# Patient Record
Sex: Female | Born: 1935 | Race: White | Hispanic: No | Smoking: Former smoker
Health system: Southern US, Community
[De-identification: ages and names within clinical notes are randomized; demographics above are authoritative.]

## PROBLEM LIST (undated history)

## (undated) DIAGNOSIS — M1990 Unspecified osteoarthritis, unspecified site: Secondary | ICD-10-CM

## (undated) DIAGNOSIS — K219 Gastro-esophageal reflux disease without esophagitis: Secondary | ICD-10-CM

## (undated) DIAGNOSIS — D352 Benign neoplasm of pituitary gland: Secondary | ICD-10-CM

## (undated) DIAGNOSIS — I639 Cerebral infarction, unspecified: Secondary | ICD-10-CM

## (undated) DIAGNOSIS — I10 Essential (primary) hypertension: Secondary | ICD-10-CM

## (undated) DIAGNOSIS — F0150 Vascular dementia without behavioral disturbance: Secondary | ICD-10-CM

## (undated) HISTORY — PX: RECTOCELE REPAIR: SHX761

## (undated) HISTORY — PX: ABDOMINAL HYSTERECTOMY: SHX81

## (undated) HISTORY — PX: TONSILLECTOMY: SUR1361

---

## 2001-09-10 HISTORY — PX: OTHER SURGICAL HISTORY: SHX169

## 2002-06-23 ENCOUNTER — Encounter: Payer: Self-pay | Admitting: Orthopedic Surgery

## 2002-07-01 ENCOUNTER — Encounter: Payer: Self-pay | Admitting: Orthopedic Surgery

## 2002-07-01 ENCOUNTER — Inpatient Hospital Stay (HOSPITAL_COMMUNITY): Admission: RE | Admit: 2002-07-01 | Discharge: 2002-07-07 | Payer: Self-pay | Admitting: Orthopedic Surgery

## 2004-02-11 ENCOUNTER — Ambulatory Visit (HOSPITAL_COMMUNITY): Admission: RE | Admit: 2004-02-11 | Discharge: 2004-02-11 | Payer: Self-pay | Admitting: Neurosurgery

## 2006-09-10 HISTORY — PX: CATARACT EXTRACTION W/ INTRAOCULAR LENS  IMPLANT, BILATERAL: SHX1307

## 2011-08-30 ENCOUNTER — Encounter (HOSPITAL_COMMUNITY): Payer: Self-pay | Admitting: Pharmacy Technician

## 2011-09-05 NOTE — Progress Notes (Signed)
H&P performed 09/05/11 Dictation # [id number]

## 2011-09-05 NOTE — H&P (Signed)
Christie Nelson, Christie Nelson NO.:  [id number]  MEDICAL RECORD NO.:  [id number]  LOCATION:  PERIO                        FACILITY:  [facility]  PHYSICIAN:  Madlyn Frankel. Charlann Boxer, M.D.  DATE OF BIRTH:  11-Mar-1930  DATE OF ADMISSION:  09/18/2011 DATE OF DISCHARGE:                             HISTORY & PHYSICAL   ADMITTING DIAGNOSIS:  Osteoarthritis, left hip.  HISTORY OF PRESENT ILLNESS:  This is a 77 year old lady with a history of osteoarthritis of the left hip, which has failed conservative management to alleviate her symptoms.  She has had previous right total hip arthroplasty with no problems and a good result, is now scheduled for left total hip arthroplasty by anterior approach.  The surgery risk, benefits, and aftercare were discussed in detail with the patient. Questions invited and answered.  She is planning on going home after surgery.  She is given her home medicines of aspirin, iron, MiraLax, Colace, and Robaxin.  She is not a candidate for trans-cinnamic acid or dexamethasone preop, and will not receive that.  MEDICAL DOCTOR:  Fara Chute, MD.  DRUG ALLERGIES:  None.  CURRENT MEDICATIONS: 1. Bromocriptine 2.5 mg half tablet daily. 2. Diclofenac 50 mg b.i.d. 3. Pantoprazole 40 mg daily.  SERIOUS MEDICAL ILLNESSES:  Include: 1. 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 2. Arthritis. 3. Reflux.  PREVIOUS SURGERIES:  Include: 1. Tonsillectomy. 2. Hysterectomy. 3. Rectocele repair as well as right total hip arthroplasty.  FAMILY HISTORY:  Includes kidney disease and brain tumor in her mother.  SOCIAL HISTORY:  The patient is married.  She lives at home.  She does not smoke and does not drink, and again she is planning on going home after surgery.  REVIEW OF SYSTEMS:  CENTRAL NERVOUS SYSTEM:  Positive for anxiety, shingles, in cataracts.  PULMONARY:  Negative for shortness of breath, PND, and orthopnea.  CARDIOVASCULAR:  Negative for chest  pain or palpitation.  Positive for varicose veins.  Negative for DVT.  GI: Positive for reflux and hemorrhoids.  Negative for ulcers or hepatitis. GU:  Negative for urinary tract difficulty.  MUSCULOSKELETAL:  Positive in HPI.  PHYSICAL EXAMINATION:  GENERAL:  Reveals a well-developed, well- nourished lady, in no acute distress. VITAL SIGNS:  Blood pressure 155/82, pulse 63 and regular, respirations 12. HEENT.  Head normocephalic.  Nose patent.  Ears patent.  Pupils equal, round, reactive to light.  Throat without injection. NECK:  Supple without adenopathy.  Carotids 2+ without bruit. CHEST:  Clear to auscultation.  No rales or rhonchi.  Respirations 12. HEART:  Regular rate and rhythm at 63 beats per minute without murmur. ABDOMEN:  Soft with active bowel sounds.  No masses or organomegaly. NEUROLOGIC:  Patient is alert, oriented to time, place, person.  Cranial nerves II through XII grossly intact. EXTREMITIES:  The right hip is status post total hip arthroplasty with good result.  Good range of motion.  Neurovascular status intact.  The left hip shows decreased range of motion with pain.  Neurovascular status intact.  IMPRESSION:  Osteoarthritis, left hip.  PLAN:  Total hip arthroplasty, left hip, by anterior approach.     Jaquelyn Bitter. Ernestene Kiel.   ______________________________ Molli Hazard  Rosalia Hammers, M.D.    SJC/MEDQ  D:  09/05/2011  T:  09/05/2011  Job:  [id number]

## 2011-09-10 ENCOUNTER — Encounter (HOSPITAL_COMMUNITY)
Admission: RE | Admit: 2011-09-10 | Discharge: 2011-09-10 | Disposition: A | Discharge status: Home / Self Care | Payer: Medicare Other | Source: Ambulatory Visit | Attending: Orthopedic Surgery | Admitting: Orthopedic Surgery

## 2011-09-10 ENCOUNTER — Encounter (HOSPITAL_COMMUNITY): Payer: Self-pay

## 2011-09-10 HISTORY — DX: Gastro-esophageal reflux disease without esophagitis: K21.9

## 2011-09-10 HISTORY — DX: Unspecified osteoarthritis, unspecified site: M19.90

## 2011-09-10 HISTORY — DX: Benign neoplasm of pituitary gland: D35.2

## 2011-09-10 LAB — COMPREHENSIVE METABOLIC PANEL
ALT: 17 U/L (ref 0–35)
AST: 18 U/L (ref 0–37)
Albumin: 3.9 g/dL (ref 3.5–5.2)
Alkaline Phosphatase: 124 U/L — ABNORMAL HIGH (ref 39–117)
Chloride: 103 mEq/L (ref 96–112)
Potassium: 4 mEq/L (ref 3.5–5.1)
Sodium: 139 mEq/L (ref 135–145)
Total Protein: 7.2 g/dL (ref 6.0–8.3)

## 2011-09-10 LAB — CBC
HCT: 37.4 % (ref 36.0–46.0)
Hemoglobin: 11.9 g/dL — ABNORMAL LOW (ref 12.0–15.0)
MCH: 28.6 pg (ref 26.0–34.0)
MCV: 89.9 fL (ref 78.0–100.0)
RBC: 4.16 MIL/uL (ref 3.87–5.11)

## 2011-09-10 LAB — DIFFERENTIAL
Eosinophils Absolute: 0.2 10*3/uL (ref 0.0–0.7)
Eosinophils Relative: 3 % (ref 0–5)
Lymphs Abs: 1 10*3/uL (ref 0.7–4.0)
Monocytes Absolute: 0.4 10*3/uL (ref 0.1–1.0)
Monocytes Relative: 7 % (ref 3–12)
Neutrophils Relative %: 72 % (ref 43–77)

## 2011-09-10 LAB — URINE MICROSCOPIC-ADD ON

## 2011-09-10 LAB — SURGICAL PCR SCREEN
MRSA, PCR: NEGATIVE
Staphylococcus aureus: POSITIVE — AB

## 2011-09-10 LAB — URINALYSIS, ROUTINE W REFLEX MICROSCOPIC
Glucose, UA: NEGATIVE mg/dL
Hgb urine dipstick: NEGATIVE
Protein, ur: NEGATIVE mg/dL

## 2011-09-10 NOTE — Patient Instructions (Signed)
20 Anberlyn C Rumbold  09/10/2011   Your procedure is scheduled on: 09-17-2010  Report to [facility] at 1000 AM.  Call this number if you have problems the morning of surgery: [phone]   Remember:   Do not eat food:After Midnight.  May have clear liquids:until Midnight .  Clear liquids include soda, tea, black coffee, apple or grape juice, broth.  Take these medicines the morning of surgery with A SIP OF WATER: protonix, bromocriptin   Do not wear jewelry, make-up or nail polish.  Do not wear lotions, powders, or perfumes. .  Do not shave 48 hours prior to surgery.  Do not bring valuables to the hospital.  Contacts, dentures or bridgework may not be worn into surgery.  Leave suitcase in the car. After surgery it may be brought to your room.  For patients admitted to the hospital, checkout time is 11:00 AM the day of discharge.   Special Instructions: CHG Shower Use Special Wash: 1/2 bottle night before surgery and 1/2 bottle morning of surgery.neck down avoid private area, no shving 2 days before showers   Please read over the following fact sheets that you were given: MRSA Information, blood fact sheet  Cain Sieve, rn [facility] pre op nurse phone number call if needed [phone]

## 2011-09-10 NOTE — Pre-Procedure Instructions (Signed)
Medical clearance note dr Neita Carp on chart

## 2011-09-18 ENCOUNTER — Inpatient Hospital Stay (HOSPITAL_COMMUNITY)
Admission: RE | Admit: 2011-09-18 | Discharge: 2011-09-21 | DRG: 470 | Disposition: A | Discharge status: Home Health | Payer: Medicare Other | Source: Ambulatory Visit | Attending: Orthopedic Surgery | Admitting: Orthopedic Surgery

## 2011-09-18 ENCOUNTER — Inpatient Hospital Stay (HOSPITAL_COMMUNITY): Payer: Medicare Other

## 2011-09-18 ENCOUNTER — Encounter (HOSPITAL_COMMUNITY): Payer: Self-pay | Admitting: Anesthesiology

## 2011-09-18 ENCOUNTER — Inpatient Hospital Stay (HOSPITAL_COMMUNITY): Payer: Medicare Other | Admitting: Anesthesiology

## 2011-09-18 ENCOUNTER — Encounter (HOSPITAL_COMMUNITY): Payer: Self-pay | Admitting: *Deleted

## 2011-09-18 ENCOUNTER — Encounter (HOSPITAL_COMMUNITY)
Admission: RE | Disposition: A | Discharge status: Home Health | Payer: Self-pay | Source: Ambulatory Visit | Attending: Orthopedic Surgery

## 2011-09-18 DIAGNOSIS — Z96649 Presence of unspecified artificial hip joint: Secondary | ICD-10-CM

## 2011-09-18 DIAGNOSIS — M1610 Unilateral primary osteoarthritis, unspecified hip: Principal | ICD-10-CM | POA: Diagnosis present

## 2011-09-18 DIAGNOSIS — M169 Osteoarthritis of hip, unspecified: Principal | ICD-10-CM | POA: Diagnosis present

## 2011-09-18 HISTORY — PX: TOTAL HIP ARTHROPLASTY: SHX124

## 2011-09-18 LAB — ABO/RH: ABO/RH(D): O POS

## 2011-09-18 SURGERY — ARTHROPLASTY, HIP, TOTAL, ANTERIOR APPROACH
Anesthesia: General | Site: Hip | Laterality: Left | Wound class: Clean

## 2011-09-18 MED ORDER — CEFAZOLIN SODIUM 1-5 GM-% IV SOLN
1.0000 g | Freq: Four times a day (QID) | INTRAVENOUS | Status: AC
  Administered 2011-09-19 (×2): 1 g via INTRAVENOUS
  Filled 2011-09-18 (×3): qty 50

## 2011-09-18 MED ORDER — NEOSTIGMINE METHYLSULFATE 1 MG/ML IJ SOLN
INTRAMUSCULAR | Status: DC | PRN
  Administered 2011-09-18: 4 mg via INTRAVENOUS

## 2011-09-18 MED ORDER — LACTATED RINGERS IV SOLN
INTRAVENOUS | Status: DC
  Administered 2011-09-18: 14:00:00 via INTRAVENOUS
  Administered 2011-09-18: 1000 mL via INTRAVENOUS
  Administered 2011-09-18: 13:00:00 via INTRAVENOUS

## 2011-09-18 MED ORDER — 0.9 % SODIUM CHLORIDE (POUR BTL) OPTIME
TOPICAL | Status: DC | PRN
  Administered 2011-09-18: 1000 mL

## 2011-09-18 MED ORDER — HYDROCODONE-ACETAMINOPHEN 7.5-325 MG PO TABS
1.0000 | ORAL_TABLET | ORAL | Status: DC
  Administered 2011-09-18: 2 via ORAL
  Administered 2011-09-19 (×2): 1 via ORAL
  Administered 2011-09-19 (×2): 2 via ORAL
  Administered 2011-09-19 (×2): 1 via ORAL
  Administered 2011-09-20: 2 via ORAL
  Administered 2011-09-20 (×2): 1 via ORAL
  Administered 2011-09-20 (×2): 2 via ORAL
  Administered 2011-09-20: 1 via ORAL
  Administered 2011-09-21 (×2): 2 via ORAL
  Administered 2011-09-21: 1 via ORAL
  Filled 2011-09-18: qty 2
  Filled 2011-09-18: qty 1
  Filled 2011-09-18: qty 2
  Filled 2011-09-18 (×3): qty 1
  Filled 2011-09-18: qty 2
  Filled 2011-09-18: qty 1
  Filled 2011-09-18 (×3): qty 2
  Filled 2011-09-18: qty 1
  Filled 2011-09-18: qty 2
  Filled 2011-09-18 (×3): qty 1
  Filled 2011-09-18: qty 2

## 2011-09-18 MED ORDER — RIVAROXABAN 10 MG PO TABS
10.0000 mg | ORAL_TABLET | ORAL | Status: DC
  Administered 2011-09-19 – 2011-09-20 (×2): 10 mg via ORAL
  Filled 2011-09-18 (×3): qty 1

## 2011-09-18 MED ORDER — ONDANSETRON HCL 4 MG/2ML IJ SOLN: 4.0000 mg | Freq: Four times a day (QID) | INTRAMUSCULAR | Status: DC | PRN

## 2011-09-18 MED ORDER — METHOCARBAMOL 100 MG/ML IJ SOLN
500.0000 mg | Freq: Four times a day (QID) | INTRAVENOUS | Status: DC | PRN
  Administered 2011-09-18: 500 mg via INTRAVENOUS
  Filled 2011-09-18: qty 5

## 2011-09-18 MED ORDER — POTASSIUM CHLORIDE 2 MEQ/ML IV SOLN
100.0000 mL/h | INTRAVENOUS | Status: DC
  Administered 2011-09-19 (×2): 100 mL/h via INTRAVENOUS
  Filled 2011-09-18 (×11): qty 1000

## 2011-09-18 MED ORDER — ONDANSETRON HCL 4 MG PO TABS: 4.0000 mg | ORAL_TABLET | Freq: Four times a day (QID) | ORAL | Status: DC | PRN

## 2011-09-18 MED ORDER — ACETAMINOPHEN 10 MG/ML IV SOLN
INTRAVENOUS | Status: AC
  Filled 2011-09-18: qty 100

## 2011-09-18 MED ORDER — METOCLOPRAMIDE HCL 10 MG PO TABS: 5.0000 mg | ORAL_TABLET | Freq: Three times a day (TID) | ORAL | Status: DC | PRN

## 2011-09-18 MED ORDER — ALUM & MAG HYDROXIDE-SIMETH 200-200-20 MG/5ML PO SUSP: 30.0000 mL | ORAL | Status: DC | PRN

## 2011-09-18 MED ORDER — PANTOPRAZOLE SODIUM 40 MG PO TBEC
40.0000 mg | DELAYED_RELEASE_TABLET | Freq: Every day | ORAL | Status: DC
  Administered 2011-09-19 – 2011-09-20 (×2): 40 mg via ORAL
  Filled 2011-09-18 (×3): qty 1

## 2011-09-18 MED ORDER — POLYETHYLENE GLYCOL 3350 17 G PO PACK
17.0000 g | PACK | Freq: Two times a day (BID) | ORAL | Status: DC
  Administered 2011-09-18 – 2011-09-20 (×4): 17 g via ORAL
  Filled 2011-09-18 (×7): qty 1

## 2011-09-18 MED ORDER — PHENOL 1.4 % MT LIQD: 1.0000 | OROMUCOSAL | Status: DC | PRN

## 2011-09-18 MED ORDER — ZOLPIDEM TARTRATE 5 MG PO TABS: 5.0000 mg | ORAL_TABLET | Freq: Every evening | ORAL | Status: DC | PRN

## 2011-09-18 MED ORDER — METHOCARBAMOL 500 MG PO TABS
500.0000 mg | ORAL_TABLET | Freq: Four times a day (QID) | ORAL | Status: DC | PRN
  Administered 2011-09-19: 500 mg via ORAL
  Filled 2011-09-18: qty 1

## 2011-09-18 MED ORDER — ACETAMINOPHEN 325 MG PO TABS: 650.0000 mg | ORAL_TABLET | Freq: Four times a day (QID) | ORAL | Status: DC | PRN

## 2011-09-18 MED ORDER — MENTHOL 3 MG MT LOZG: 1.0000 | LOZENGE | OROMUCOSAL | Status: DC | PRN

## 2011-09-18 MED ORDER — DIPHENHYDRAMINE HCL 25 MG PO CAPS: 25.0000 mg | ORAL_CAPSULE | Freq: Four times a day (QID) | ORAL | Status: DC | PRN

## 2011-09-18 MED ORDER — HYDROMORPHONE HCL PF 1 MG/ML IJ SOLN: 0.5000 mg | INTRAMUSCULAR | Status: DC | PRN

## 2011-09-18 MED ORDER — PROPOFOL 10 MG/ML IV BOLUS
INTRAVENOUS | Status: DC | PRN
  Administered 2011-09-18: 100 mg via INTRAVENOUS

## 2011-09-18 MED ORDER — FERROUS SULFATE 325 (65 FE) MG PO TABS
325.0000 mg | ORAL_TABLET | Freq: Three times a day (TID) | ORAL | Status: DC
  Administered 2011-09-19 – 2011-09-21 (×7): 325 mg via ORAL
  Filled 2011-09-18 (×10): qty 1

## 2011-09-18 MED ORDER — EPHEDRINE SULFATE 50 MG/ML IJ SOLN
INTRAMUSCULAR | Status: DC | PRN
  Administered 2011-09-18: 10 mg via INTRAVENOUS

## 2011-09-18 MED ORDER — CEFAZOLIN SODIUM 1-5 GM-% IV SOLN
1.0000 g | INTRAVENOUS | Status: AC
  Administered 2011-09-18: 1 g via INTRAVENOUS

## 2011-09-18 MED ORDER — METOCLOPRAMIDE HCL 5 MG/ML IJ SOLN: 5.0000 mg | Freq: Three times a day (TID) | INTRAMUSCULAR | Status: DC | PRN

## 2011-09-18 MED ORDER — ACETAMINOPHEN 650 MG RE SUPP: 650.0000 mg | Freq: Four times a day (QID) | RECTAL | Status: DC | PRN

## 2011-09-18 MED ORDER — PROMETHAZINE HCL 25 MG/ML IJ SOLN: 6.2500 mg | INTRAMUSCULAR | Status: DC | PRN

## 2011-09-18 MED ORDER — GLYCOPYRROLATE 0.2 MG/ML IJ SOLN
INTRAMUSCULAR | Status: DC | PRN
  Administered 2011-09-18: .5 mg via INTRAVENOUS

## 2011-09-18 MED ORDER — ACETAMINOPHEN 10 MG/ML IV SOLN
INTRAVENOUS | Status: DC | PRN
  Administered 2011-09-18: 1000 mg via INTRAVENOUS

## 2011-09-18 MED ORDER — BISACODYL 5 MG PO TBEC: 5.0000 mg | DELAYED_RELEASE_TABLET | Freq: Every day | ORAL | Status: DC | PRN

## 2011-09-18 MED ORDER — DEXAMETHASONE SODIUM PHOSPHATE 10 MG/ML IJ SOLN
10.0000 mg | Freq: Once | INTRAMUSCULAR | Status: AC
  Administered 2011-09-19: 10 mg via INTRAVENOUS
  Filled 2011-09-18 (×2): qty 1

## 2011-09-18 MED ORDER — HYDROMORPHONE HCL PF 1 MG/ML IJ SOLN
0.2500 mg | INTRAMUSCULAR | Status: DC | PRN
  Administered 2011-09-18: 0.5 mg via INTRAVENOUS

## 2011-09-18 MED ORDER — ONDANSETRON HCL 4 MG/2ML IJ SOLN
INTRAMUSCULAR | Status: DC | PRN
  Administered 2011-09-18: 4 mg via INTRAVENOUS

## 2011-09-18 MED ORDER — ROCURONIUM BROMIDE 100 MG/10ML IV SOLN
INTRAVENOUS | Status: DC | PRN
  Administered 2011-09-18: 40 mg via INTRAVENOUS

## 2011-09-18 MED ORDER — FLEET ENEMA 7-19 GM/118ML RE ENEM: 1.0000 | ENEMA | Freq: Once | RECTAL | Status: AC | PRN

## 2011-09-18 MED ORDER — FENTANYL CITRATE 0.05 MG/ML IJ SOLN
INTRAMUSCULAR | Status: DC | PRN
  Administered 2011-09-18 (×3): 50 ug via INTRAVENOUS
  Administered 2011-09-18 (×2): 100 ug via INTRAVENOUS

## 2011-09-18 MED ORDER — DOCUSATE SODIUM 100 MG PO CAPS
100.0000 mg | ORAL_CAPSULE | Freq: Two times a day (BID) | ORAL | Status: DC
  Administered 2011-09-18 – 2011-09-20 (×5): 100 mg via ORAL
  Filled 2011-09-18 (×7): qty 1

## 2011-09-18 MED ORDER — BROMOCRIPTINE MESYLATE 2.5 MG PO TABS
1.2500 mg | ORAL_TABLET | Freq: Every day | ORAL | Status: DC
  Administered 2011-09-19 – 2011-09-21 (×3): 1.25 mg via ORAL
  Filled 2011-09-18 (×3): qty 1

## 2011-09-18 MED ORDER — HYDROMORPHONE HCL PF 1 MG/ML IJ SOLN
INTRAMUSCULAR | Status: AC
  Filled 2011-09-18: qty 1

## 2011-09-18 MED ORDER — PANTOPRAZOLE SODIUM 40 MG PO TBEC: 40.0000 mg | DELAYED_RELEASE_TABLET | ORAL | Status: DC

## 2011-09-18 SURGICAL SUPPLY — 40 items
ADH SKN CLS APL DERMABOND .7 (GAUZE/BANDAGES/DRESSINGS) ×1
BAG SPEC THK2 15X12 ZIP CLS (MISCELLANEOUS) ×2
BAG ZIPLOCK 12X15 (MISCELLANEOUS) ×4 IMPLANT
BLADE SAW SGTL 18X1.27X75 (BLADE) ×2 IMPLANT
CELLS DAT CNTRL 66122 CELL SVR (MISCELLANEOUS) ×1 IMPLANT
CLOTH BEACON ORANGE TIMEOUT ST (SAFETY) ×2 IMPLANT
DERMABOND ADVANCED (GAUZE/BANDAGES/DRESSINGS) ×1
DERMABOND ADVANCED .7 DNX12 (GAUZE/BANDAGES/DRESSINGS) ×1 IMPLANT
DRAPE C-ARM 42X72 X-RAY (DRAPES) ×2 IMPLANT
DRAPE STERI IOBAN 125X83 (DRAPES) ×2 IMPLANT
DRAPE U-SHAPE 47X51 STRL (DRAPES) ×6 IMPLANT
DRSG AQUACEL AG ADV 3.5X10 (GAUZE/BANDAGES/DRESSINGS) ×2 IMPLANT
DRSG TEGADERM 4X4.75 (GAUZE/BANDAGES/DRESSINGS) ×1 IMPLANT
DURAPREP 26ML APPLICATOR (WOUND CARE) ×2 IMPLANT
ELECT BLADE TIP CTD 4 INCH (ELECTRODE) ×2 IMPLANT
ELECT REM PT RETURN 9FT ADLT (ELECTROSURGICAL) ×2
ELECTRODE REM PT RTRN 9FT ADLT (ELECTROSURGICAL) ×1 IMPLANT
EVACUATOR 1/8 PVC DRAIN (DRAIN) IMPLANT
FACESHIELD LNG OPTICON STERILE (SAFETY) ×8 IMPLANT
GAUZE SPONGE 2X2 8PLY STRL LF (GAUZE/BANDAGES/DRESSINGS) ×1 IMPLANT
GLOVE BIOGEL PI IND STRL 7.5 (GLOVE) ×1 IMPLANT
GLOVE BIOGEL PI IND STRL 8 (GLOVE) ×1 IMPLANT
GLOVE BIOGEL PI INDICATOR 7.5 (GLOVE) ×1
GLOVE BIOGEL PI INDICATOR 8 (GLOVE) ×1
GLOVE ECLIPSE 8.0 STRL XLNG CF (GLOVE) ×2 IMPLANT
GLOVE ORTHO TXT STRL SZ7.5 (GLOVE) ×4 IMPLANT
GOWN STRL NON-REIN LRG LVL3 (GOWN DISPOSABLE) ×2 IMPLANT
KIT BASIN OR (CUSTOM PROCEDURE TRAY) ×2 IMPLANT
PACK TOTAL JOINT (CUSTOM PROCEDURE TRAY) ×2 IMPLANT
PADDING CAST COTTON 6X4 STRL (CAST SUPPLIES) ×2 IMPLANT
RETRACTOR WND ALEXIS 18 MED (MISCELLANEOUS) ×1 IMPLANT
RTRCTR WOUND ALEXIS 18CM MED (MISCELLANEOUS) ×2
SPONGE GAUZE 2X2 STER 10/PKG (GAUZE/BANDAGES/DRESSINGS) ×1
SUCTION FRAZIER 12FR DISP (SUCTIONS) ×2 IMPLANT
SUT MNCRL AB 4-0 PS2 18 (SUTURE) ×2 IMPLANT
SUT VIC AB 1 CT1 36 (SUTURE) ×8 IMPLANT
SUT VIC AB 2-0 CT1 27 (SUTURE) ×4
SUT VIC AB 2-0 CT1 TAPERPNT 27 (SUTURE) ×2 IMPLANT
TOWEL OR 17X26 10 PK STRL BLUE (TOWEL DISPOSABLE) ×4 IMPLANT
TRAY FOLEY CATH 14FRSI W/METER (CATHETERS) ×2 IMPLANT

## 2011-09-18 NOTE — Op Note (Signed)
NAME:  Christie Nelson                ACCOUNT NO.: [id number]      MEDICAL RECORD NO.: [id number]      FACILITY:  [facility]      PHYSICIAN:  Durene Romans D  DATE OF BIRTH:  05/26/1930     DATE OF PROCEDURE:  09/18/2011                                 OPERATIVE REPORT         PREOPERATIVE DIAGNOSIS: Left  hip osteoarthritis.      POSTOPERATIVE DIAGNOSIS:  Left osteoarthritis.      PROCEDURE:  Left total hip replacement through an anterior approach   utilizing DePuy THR system, component size 50 pinnacle cup, a size 32+4 neutral   Altrex liner, a size 4 standard Tri Lock stem with a 32+1 delta ceramic   ball.      SURGEON:  Madlyn Frankel. Charlann Boxer, M.D.      ASSISTANT:  Lanney Gins, PA      ANESTHESIA:  General.      SPECIMENS:  None.      COMPLICATIONS:  None.      BLOOD LOSS:  300 cc     DRAINS:  One Hemovac.      INDICATION OF THE PROCEDURE:  STARLINA Nelson is a 78 y.o. female who had   presented to office for evaluation of left hip pain.  Radiographs revealed   progressive degenerative changes with bone-on-bone   articulation to the  hip joint.  The patient had painful limited range of   motion significantly affecting their overall quality of life.  The patient was failing to    respond to conservative measures, and at this point was ready   to proceed with more definitive measures.  The patient has noted progressive   degenerative changes in his hip, progressive problems and dysfunction   with regarding the hip prior to surgery.  Consent was obtained for   benefit of pain relief.  Specific risk of infection, DVT, component   failure, dislocation, need for revision surgery, as well discussion of   the anterior versus posterior approach were reviewed.  Consent was   obtained for benefit of anterior pain relief through an anterior   approach.      PROCEDURE IN DETAIL:  The patient was brought to operative theater.   Once adequate anesthesia, preoperative antibiotics,  1gm Ancef administered.   The patient was positioned supine on the OSI Hanna table.  Once adequate   padding of boney process was carried out, we had predraped out the hip, and  used fluoroscopy to confirm orientation of the pelvis and position.      The left hip was then prepped and draped from proximal iliac crest to   mid thigh with shower curtain technique.      Time-out was performed identifying the patient, planned procedure, and   extremity.     An incision was then made 2 cm distal and lateral to the   anterior superior iliac spine extending over the orientation of the   tensor fascia lata muscle and sharp dissection was carried down to the   fascia of the muscle and protractor placed in the soft tissues.      The fascia was then incised.  The muscle belly was identified and swept  laterally and retractor placed along the superior neck.  Following   cauterization of the circumflex vessels and removing some pericapsular   fat, a second cobra retractor was placed on the inferior neck.  A third   retractor was placed on the anterior acetabulum after elevating the   anterior rectus.  A L-capsulotomy was along the line of the   superior neck to the trochanteric fossa, then extended proximally and   distally.  Tag sutures were placed and the retractors were then placed   intracapsular.  We then identified the trochanteric fossa and   orientation of my neck cut, confirmed this radiographically   and then made a neck osteotomy with the femur on traction.  The femoral   head was removed without difficulty or complication.  Traction was let   off and retractors were placed posterior and anterior around the   acetabulum.      The labrum and foveal tissue were debrided.  I began reaming with a 44   reamer and reamed up to 49 reamer with good bony bed preparation and a 50   cup was chosen.  The final 50 Pinnacle cup was then impacted under fluoroscopy  to confirm the depth of penetration  and orientation with respect to   abduction.  A screw was placed followed by the hole eliminator.  The final   32+4 Altrex liner was impacted with good visualized rim fit.  The cup was positioned anatomically within the acetabular portion of the pelvis.      At this point, the femur was rolled at 80 degrees.  Further capsule was   released off the inferior aspect of the femoral neck.  I then   released the superior capsule proximally.  The hook was placed laterally   along the femur and elevated manually and held in position with the bed   hook.  The leg was then extended and adducted with the leg rolled to 100   degrees of external rotation.  Once the proximal femur was fully   exposed, I used a box osteotome to set orientation.  I then began   broaching with the starting chili pepper broach and passed this by hand and then broached up to 4.  With the 4 broach in place I chose a standard neck and did a trial reduction.  The offset was appropriate, leg lengths   appeared to be equal, confirmed radiographically.   Given these findings, I went ahead and dislocated the hip, repositioned all   retractors and positioned the right hip in the extended and abducted position.  The final 4 Standard Tri Lock stem was   chosen and it was impacted down to the level of neck cut.  Based on this   and the trial reduction, a 32+1 delta ceramic ball was chosen and   impacted onto a clean and dry trunnion, and the hip was reduced.  The   hip had been irrigated throughout the case again at this point.  I did   reapproximate the superior capsular leaflet to the anterior leaflet   using #1 Vicryl, placed a medium Hemovac drain deep.  The fascia of the   tensor fascia lata muscle was then reapproximated using #1 Vicryl.  The   remaining wound was closed with 2-0 Vicryl and running 4-0 Monocryl.   The hip was cleaned, dried, and dressed sterilely using Dermabond and   Aquacel dressing.  Drain site dressed  separately.  She was then brought  to recovery room in stable condition tolerating the procedure well.    Lanney Gins was present for the entirety of the case involved from   preoperative positioning, perioperative retractor management, general   facilitation of the case, as well as primary wound closure as assistant.            Madlyn Frankel Charlann Boxer, M.D.            MDO/MEDQ  D:  07/03/2011  T:  07/03/2011  Job:  [id number]      Electronically Signed by Durene Romans M.D. on 07/09/2011 09:15:38 AM

## 2011-09-18 NOTE — Transfer of Care (Signed)
Immediate Anesthesia Transfer of Care Note  Patient: Christie Nelson  Procedure(s) Performed:  TOTAL HIP ARTHROPLASTY ANTERIOR APPROACH  Patient Location: PACU  Anesthesia Type: General  Level of Consciousness: awake and patient cooperative  Airway & Oxygen Therapy: Patient Spontanous Breathing and Patient connected to face mask oxygen  Post-op Assessment: Report given to PACU RN and Post -op Vital signs reviewed and stable  Post vital signs: Reviewed and stable  Complications: No apparent anesthesia complications

## 2011-09-18 NOTE — Interval H&P Note (Signed)
History and Physical Interval Note:  09/18/2011 12:04 PM  Christie Nelson  has presented today for surgery, with the diagnosis of left hip arthritis   The various methods of treatment have been discussed with the patient and family. After consideration of risks, benefits and other options for treatment, the patient has consented to  Procedure(s): LEFT TOTAL HIP ARTHROPLASTY ANTERIOR APPROACH as a surgical intervention .  The patients' history has been reviewed, patient examined, no change in status, stable for surgery.  I have reviewed the patients' chart and labs.  Questions were answered to the patient's satisfaction.     Shelda Pal

## 2011-09-18 NOTE — Anesthesia Postprocedure Evaluation (Signed)
  Anesthesia Post-op Note  Patient: Christie Nelson  Procedure(s) Performed:  TOTAL HIP ARTHROPLASTY ANTERIOR APPROACH  Patient Location: PACU  Anesthesia Type: General  Level of Consciousness: awake and alert   Airway and Oxygen Therapy: Patient Spontanous Breathing  Post-op Pain: mild  Post-op Assessment: Post-op Vital signs reviewed, Patient's Cardiovascular Status Stable, Respiratory Function Stable, Patent Airway and No signs of Nausea or vomiting  Post-op Vital Signs: stable  Complications: No apparent anesthesia complications

## 2011-09-18 NOTE — Anesthesia Preprocedure Evaluation (Signed)
Anesthesia Evaluation  Patient identified by MRN, date of birth, ID band Patient awake    Reviewed: Allergy & Precautions, H&P , NPO status , Patient's Chart, lab work & pertinent test results  Airway Mallampati: II TM Distance: >3 FB Neck ROM: Full    Dental No notable dental hx.    Pulmonary neg pulmonary ROS,  clear to auscultation  Pulmonary exam normal       Cardiovascular neg cardio ROS Regular Normal    Neuro/Psych Negative Neurological ROS  Negative Psych ROS   GI/Hepatic negative GI ROS, Neg liver ROS, GERD-  ,  Endo/Other  Negative Endocrine ROS  Renal/GU negative Renal ROS  Genitourinary negative   Musculoskeletal negative musculoskeletal ROS (+)   Abdominal   Peds negative pediatric ROS (+)  Hematology negative hematology ROS (+)   Anesthesia Other Findings   Reproductive/Obstetrics negative OB ROS                          Anesthesia Physical Anesthesia Plan  ASA: II  Anesthesia Plan: General   Post-op Pain Management:    Induction: Intravenous  Airway Management Planned: Oral ETT  Additional Equipment:   Intra-op Plan:   Post-operative Plan: Extubation in OR  Informed Consent: I have reviewed the patients History and Physical, chart, labs and discussed the procedure including the risks, benefits and alternatives for the proposed anesthesia with the patient or authorized representative who has indicated his/her understanding and acceptance.   Dental advisory given  Plan Discussed with: CRNA  Anesthesia Plan Comments:        Anesthesia Quick Evaluation  

## 2011-09-18 NOTE — H&P (View-Only) (Signed)
H&P performed 09/05/11 Dictation # [id number]  

## 2011-09-19 LAB — CBC
MCHC: 32.3 g/dL (ref 30.0–36.0)
MCV: 89.6 fL (ref 78.0–100.0)
Platelets: 186 10*3/uL (ref 150–400)
RDW: 13.3 % (ref 11.5–15.5)
WBC: 5.6 10*3/uL (ref 4.0–10.5)

## 2011-09-19 LAB — BASIC METABOLIC PANEL
Chloride: 102 mEq/L (ref 96–112)
Creatinine, Ser: 0.85 mg/dL (ref 0.50–1.10)
GFR calc Af Amer: 74 mL/min — ABNORMAL LOW (ref >90)
GFR calc non Af Amer: 63 mL/min — ABNORMAL LOW (ref >90)

## 2011-09-19 NOTE — Progress Notes (Signed)
Subjective: 1 Day Post-Op Procedure(s) (LRB): TOTAL HIP ARTHROPLASTY ANTERIOR APPROACH (Left)   Patient reports pain as mild.  Objective:   VITALS:   Filed Vitals:   09/19/11 0600  BP: 119/56  Pulse: 69  Temp: 98.7 F (37.1 C)  Resp: 16    Neurovascular intact Dorsiflexion/Plantar flexion intact Incision: dressing C/D/I No cellulitis present Compartment soft  LABS  Basename 09/19/11 0431  HGB 9.2*  HCT 28.5*  WBC 5.6  PLT 186     Basename 09/19/11 0431  NA 135  K 4.0  BUN 9  CREATININE 0.85  GLUCOSE 99     Assessment/Plan: 1 Day Post-Op Procedure(s) (LRB): TOTAL HIP ARTHROPLASTY ANTERIOR APPROACH (Left)   Foley cath D/C'ed HV drain D/C'ed Advance diet Up with therapy D/C IV fluids Evaluate how she is doing, unsure if husband is able to take care of her at home, consult SW.   Anastasio Auerbach Leonda Cristo   PAC  09/19/2011, 9:48 AM

## 2011-09-19 NOTE — Progress Notes (Signed)
Physical Therapy Evaluation Patient Details Name: Christie Nelson MRN: [id number] DOB: [birth year] Today's Date: 09/19/2011 9:40-10:07, Ev2  Problem List:  Patient Active Problem List  Diagnoses  . S/P left hip replacement    Past Medical History:  Past Medical History  Diagnosis Date  . GERD (gastroesophageal reflux disease)   . Arthritis   . Prolactinoma    Past Surgical History:  Past Surgical History  Procedure Date  . Right hip arthroplasty 2003  . Abdominal hysterectomy yrs ago  . Tonsillectomy age 61  . Rectocele repair yrs ago  . Cataract extraction w/ intraocular lens  implant, bilateral 2008    both eyes done    PT Assessment/Plan/Recommendation PT Assessment Clinical Impression Statement: 78 y/o WF admitted with THA- anterior approach.  Pt will be followed acutely to work towards returning to PLOF.  Recommend SNF for more rehab after d/c from acute care. PT Recommendation/Assessment: Patient will need skilled PT in the acute care venue PT Problem List: Decreased strength;Decreased range of motion;Decreased mobility;Decreased knowledge of use of DME;Pain Barriers to Discharge: Decreased caregiver support Barriers to Discharge Comments: Pt reports she normally does most things around the house and doesn't know how much help her husband would be able to give. PT Therapy Diagnosis : Difficulty walking PT Plan PT Frequency: 7X/week PT Treatment/Interventions: DME instruction;Gait training;Functional mobility training;Therapeutic activities;Therapeutic exercise;Patient/family education PT Recommendation Follow Up Recommendations: Skilled nursing facility Equipment Recommended: Defer to next venue PT Goals  Acute Rehab PT Goals PT Goal Formulation: With patient Time For Goal Achievement: 7 days Pt will go Supine/Side to Sit: with supervision PT Goal: Supine/Side to Sit - Progress: Not met Pt will go Sit to Stand: with supervision PT Goal: Sit to Stand - Progress:  Not met Pt will Ambulate: 51 - 150 feet;with rolling walker;with supervision PT Goal: Ambulate - Progress: Not met Pt will Perform Home Exercise Program: with supervision, verbal cues required/provided PT Goal: Perform Home Exercise Program - Progress: Not met  PT Evaluation Precautions/Restrictions  Precautions Precautions: Anterior Hip Precaution Booklet Issued: No Required Braces or Orthoses: No Restrictions Weight Bearing Restrictions: Yes LLE Weight Bearing: Weight bearing as tolerated Prior Functioning  Home Living Lives With: Spouse Type of Home: Mobile home Home Layout: One level Home Access: Stairs to enter Entrance Stairs-Rails: Can reach both;Left;Right Entrance Stairs-Number of Steps: 3 Bathroom Shower/Tub: Health visitor: Handicapped height Home Adaptive Equipment: Straight cane;Walker - rolling Additional Comments: Elevated commode seat Prior Function Level of Independence: Independent with basic ADLs;Independent with homemaking with ambulation;Requires assistive device for independence Driving: No Vocation: Retired Producer, television/film/video: Awake/alert Overall Cognitive Status: Appears within functional limits for tasks assessed Orientation Level: Oriented X4 Sensation/Coordination Coordination Gross Motor Movements are Fluid and Coordinated: Yes Extremity Assessment RUE Assessment RUE Assessment: Within Functional Limits LUE Assessment LUE Assessment: Within Functional Limits RLE Assessment RLE Assessment: Within Functional Limits LLE AROM (degrees) LLE Overall AROM Comments: AAROM hip flex and hip abd limited 50% by pain and some muscle guarding LLE Strength LLE Overall Strength Comments: Good quad set Mobility (including Balance) Bed Mobility Bed Mobility: Yes Supine to Sit: 4: Min assist;With rails;HOB elevated (Comment degrees) Supine to Sit Details (indicate cue type and reason): A for LLE and cues for hand  placement. Transfers Transfers: Yes Sit to Stand: 4: Min assist;With upper extremity assist;From bed Sit to Stand Details (indicate cue type and reason): good use of UE Stand to Sit: To chair/3-in-1;4: Min assist;With upper extremity assist Stand to Sit Details:  cues for hand placement, LLE position. Ambulation/Gait Ambulation/Gait: Yes Ambulation/Gait Assistance: 4: Min assist Ambulation/Gait Assistance Details (indicate cue type and reason): bed to recliner only due to feeling ill.  Pt with little weight through L LE. Ambulation Distance (Feet): 2 Feet Assistive device: Rolling walker Gait Pattern: Decreased step length - right  Posture/Postural Control Posture/Postural Control: No significant limitations Exercise  Total Joint Exercises Quad Sets: 5 reps;Strengthening;Left;Supine Heel Slides: AAROM;5 reps;Left;Supine Hip ABduction/ADduction: AAROM;Left;5 reps;Supine End of Session PT - End of Session Equipment Utilized During Treatment: Gait belt Activity Tolerance: Other (comment) (limited by nausea) Patient left: in chair;with call bell in reach Nurse Communication: Mobility status for transfers;Mobility status for ambulation (pt's indigestion/nausea) General Behavior During Session: Lethargic Cognition: WFL for tasks performed  [facility] LUBECK 09/19/2011, 10:21 AM

## 2011-09-19 NOTE — Progress Notes (Signed)
FL2 in shadow chart for MD signature. Pt agreeable with plan for ST SNF placement and has been faxed out to [place]. Pt is interested in [facility]. Awaiting return call from SNF to check bed availability. Will assist with D/C planning to SNF.

## 2011-09-19 NOTE — Progress Notes (Signed)
Occupational Therapy Evaluation Patient Details Name: Christie Nelson MRN: [id number] DOB: 07/24/1930 Today's Date: 09/19/2011 [phone] EV2 Problem List:  Patient Active Problem List  Diagnoses  . S/P left hip replacement    Past Medical History:  Past Medical History  Diagnosis Date  . GERD (gastroesophageal reflux disease)   . Arthritis   . Prolactinoma    Past Surgical History:  Past Surgical History  Procedure Date  . Right hip arthroplasty 2003  . Abdominal hysterectomy yrs ago  . Tonsillectomy age 78  . Rectocele repair yrs ago  . Cataract extraction w/ intraocular lens  implant, bilateral 2008    both eyes done    OT Assessment/Plan/Recommendation OT Assessment Clinical Impression Statement: Pt presents w/new LTKR. Skilled OT recommended to improve standing activity tolerance, increase BADL performance in prep for d/c to next venue of care. OT Recommendation/Assessment: Patient will need skilled OT in the acute care venue OT Problem List: Decreased activity tolerance;Decreased knowledge of use of DME or AE Barriers to Discharge: Inaccessible home environment;Decreased caregiver support OT Therapy Diagnosis : Generalized weakness OT Plan OT Frequency: Min 1X/week OT Treatment/Interventions: Self-care/ADL training;DME and/or AE instruction;Therapeutic activities;Patient/family education OT Recommendation Follow Up Recommendations: Skilled nursing facility Equipment Recommended: Defer to next venue Individuals Consulted Consulted and Agree with Results and Recommendations: Patient OT Goals Acute Rehab OT Goals OT Goal Formulation: With patient ADL Goals Pt Will Perform Grooming: with supervision;Standing at sink (X 3 tasks to improve standing activity tolerance.) ADL Goal: Grooming - Progress: Not met Pt Will Transfer to Toilet: with supervision;Ambulation;3-in-1;with DME ADL Goal: Toilet Transfer - Progress: Not met Pt Will Perform Toileting - Clothing  Manipulation: with supervision;Standing ADL Goal: Toileting - Clothing Manipulation - Progress: Not met Pt Will Perform Toileting - Hygiene: with supervision;Sit to stand from 3-in-1/toilet ADL Goal: Toileting - Hygiene - Progress: Not met  OT Evaluation Precautions/Restrictions  Precautions Precautions: Anterior Hip Precaution Booklet Issued: No Required Braces or Orthoses: No Restrictions Weight Bearing Restrictions: Yes LLE Weight Bearing: Weight bearing as tolerated Prior Functioning Home Living Lives With: Spouse Type of Home: Mobile home Home Layout: One level Home Access: Stairs to enter Entrance Stairs-Rails: Can reach both;Left;Right Entrance Stairs-Number of Steps: 3 Bathroom Shower/Tub: Health visitor: Handicapped height Home Adaptive Equipment: Straight cane;Walker - rolling Additional Comments: Elevated commode seat Prior Function Level of Independence: Independent with basic ADLs;Independent with homemaking with ambulation;Requires assistive device for independence Driving: No Vocation: Retired ADL ADL Eating/Feeding: Simulated;Independent Where Assessed - Eating/Feeding: Edge of bed Grooming: Performed;Wash/dry face;Set up Where Assessed - Grooming: Sitting, bed;Unsupported Upper Body Bathing: Simulated;Set up Where Assessed - Upper Body Bathing: Unsupported;Sitting, bed Lower Body Bathing: Simulated;Maximal assistance Where Assessed - Lower Body Bathing: Sit to stand from bed Upper Body Dressing: Performed;Set up Where Assessed - Upper Body Dressing: Sitting, bed;Unsupported Lower Body Dressing: Simulated;Maximal assistance Where Assessed - Lower Body Dressing: Sit to stand from bed Toilet Transfer: Simulated;Minimal assistance Toilet Transfer Details (indicate cue type and reason): multimodal cues for hand placement, step sequence, technique. Toilet Transfer Method: Surveyor, minerals: Other (comment) (sim to  chair) Toileting - Clothing Manipulation: Simulated;Moderate assistance Where Assessed - Toileting Clothing Manipulation: Standing Toileting - Hygiene: Simulated;Moderate assistance Where Assessed - Toileting Hygiene: Standing Tub/Shower Transfer: Not assessed Tub/Shower Transfer Method: Not assessed Equipment Used: Rolling walker ADL Comments: Pt w/poor activity/standing tolerance. Limited by dizziness, nausea and lethargy. Vision/Perception  Vision - History Visual History: Cataracts Patient Visual Report: No change from baseline Vision - Assessment  Vision Assessment: Vision not tested Cognition Cognition Arousal/Alertness: Awake/alert Overall Cognitive Status: Appears within functional limits for tasks assessed Orientation Level: Oriented X4 Sensation/Coordination Coordination Gross Motor Movements are Fluid and Coordinated: Yes Extremity Assessment RUE Assessment RUE Assessment: Within Functional Limits LUE Assessment LUE Assessment: Within Functional Limits Mobility  Bed Mobility Bed Mobility: Yes Supine to Sit: 4: Min assist;With rails;HOB elevated (Comment degrees) Supine to Sit Details (indicate cue type and reason): A for LLE and cues for hand placement. Transfers Transfers: Yes Sit to Stand: 4: Min assist;With upper extremity assist;From bed Sit to Stand Details (indicate cue type and reason): good use of UE Stand to Sit: To chair/3-in-1;4: Min assist;With upper extremity assist Stand to Sit Details: cues for hand placement, LLE position. Exercises  End of Session OT - End of Session Equipment Utilized During Treatment: Gait belt;Other (comment) (RW) Activity Tolerance: Patient limited by fatigue;Treatment limited secondary to medical complications (Comment) (dizziness and nausea.) Patient left: in chair;with call bell in reach Nurse Communication: Other (comment) (Informed RN of pt dizziness and nausea.) General Behavior During Session: Lethargic Cognition:  WFL for tasks performed   Reece Mcbroom A [phone] 09/19/2011, 10:23 AM

## 2011-09-19 NOTE — Progress Notes (Signed)
Physical Therapy Treatment Patient Details Name: Christie Nelson MRN: [id number] DOB: 1929/12/16 Today's Date: 09/19/2011 13:00-13:29, gt ,te PT Assessment/Plan  PT - Assessment/Plan Comments on Treatment Session: Pt up in recliner upon arrival with family members present.  Pt agreeable to ambulate short distance back to bed.  Pt demonstrates good upper body strength with bed mobility and re-positioning. PT Plan: Discharge plan remains appropriate;Frequency remains appropriate PT Frequency: 7X/week Follow Up Recommendations: Skilled nursing facility Equipment Recommended: Defer to next venue PT Goals  Acute Rehab PT Goals Pt will go Sit to Stand: with supervision PT Goal: Sit to Stand - Progress: Not met Pt will Ambulate: 51 - 150 feet;with rolling walker;with supervision PT Goal: Ambulate - Progress: Not met Pt will Perform Home Exercise Program: with supervision, verbal cues required/provided PT Goal: Perform Home Exercise Program - Progress: Not met  PT Treatment Precautions/Restrictions  Precautions Precautions: Anterior Hip Precaution Booklet Issued: No Required Braces or Orthoses: No Restrictions Weight Bearing Restrictions: Yes LLE Weight Bearing: Weight bearing as tolerated Mobility (including Balance) Bed Mobility Sit to Supine: 4: Min assist Sit to Supine - Details (indicate cue type and reason): A for L LE.  Pt with strong upper body and able to reposition self well. Transfers Sit to Stand: 4: Min assist;From chair/3-in-1;With upper extremity assist Stand to Sit: 4: Min assist;To bed;With upper extremity assist Stand to Sit Details: Cues for hand and LE placement Ambulation/Gait Ambulation/Gait Assistance: 4: Min assist Ambulation Distance (Feet): 8 Feet Assistive device: Rolling walker Gait Pattern: Step-to pattern;Decreased step length - right;Decreased step length - left;Right flexed knee in stance    Exercise  Total Joint Exercises Ankle Circles/Pumps:  AROM;10 reps;Supine Quad Sets: Strengthening;Left;10 reps;Supine Heel Slides: AAROM;10 reps;Left;Supine Hip ABduction/ADduction: AAROM;Left;10 reps;Supine End of Session PT - End of Session Equipment Utilized During Treatment: Gait belt Activity Tolerance: Patient tolerated treatment well (Pt required some encourgement to ambulate.) Patient left: in bed;with call bell in reach;with family/visitor present General Behavior During Session: [facility] for tasks performed Cognition: [facility] for tasks performed  [facility] LUBECK 09/19/2011, 2:21 PM

## 2011-09-19 NOTE — Progress Notes (Signed)
Utilization review completed.  

## 2011-09-20 LAB — CBC
HCT: 27.2 % — ABNORMAL LOW (ref 36.0–46.0)
Platelets: 186 10*3/uL (ref 150–400)
RDW: 13.2 % (ref 11.5–15.5)
WBC: 4.7 10*3/uL (ref 4.0–10.5)

## 2011-09-20 LAB — BASIC METABOLIC PANEL
BUN: 7 mg/dL (ref 6–23)
Chloride: 106 mEq/L (ref 96–112)
GFR calc Af Amer: 79 mL/min — ABNORMAL LOW (ref >90)
Potassium: 4.5 mEq/L (ref 3.5–5.1)
Sodium: 139 mEq/L (ref 135–145)

## 2011-09-20 NOTE — Progress Notes (Signed)
Subjective: 2 Days Post-Op Procedure(s) (LRB): TOTAL HIP ARTHROPLASTY ANTERIOR APPROACH (Left)   Patient reports pain as mild. No events.  Objective:   VITALS:   Filed Vitals:   09/20/11 0510  BP: 103/62  Pulse: 59  Temp: 97.4 F (36.3 C)  Resp: 18    Neurovascular intact Dorsiflexion/Plantar flexion intact Incision: dressing C/D/I No cellulitis present Compartment soft  LABS  Basename 09/20/11 0437 09/19/11 0431  HGB 8.8* 9.2*  HCT 27.2* 28.5*  WBC 4.7 5.6  PLT 186 186     Basename 09/20/11 0437 09/19/11 0431  NA 139 135  K 4.5 4.0  BUN 7 9  CREATININE 0.80 0.85  GLUCOSE 159* 99     Assessment/Plan: 2 Days Post-Op Procedure(s) (LRB): TOTAL HIP ARTHROPLASTY ANTERIOR APPROACH (Left)   Up with therapy Discharge to SNF upon discharge Pt approaching discharge, possibly Friday    Anastasio Auerbach. Regie Bunner   PAC  09/20/2011, 9:17 AM

## 2011-09-20 NOTE — Progress Notes (Signed)
Physical Therapy Treatment Patient Details Name: Christie Nelson MRN: [id number] DOB: 10-28-1929 Today's Date: 09/20/2011 13:40-14:15, gt, [place]  PT Assessment/Plan  PT - Assessment/Plan Comments on Treatment Session: Familiy present.  Pt reports she doesn't want to go to SNF anymore.  Discussed options with pt and daughter-in-law.  They will discuss which house is better to go to and let therapist know tomorrow for appropriate stair training.  Pt has 4 steps with 2 rails, and daughter-in-laws house has no rails with 3 steps. PT Plan: Frequency remains appropriate PT Frequency: 7X/week Follow Up Recommendations: Home health PT (Pt declined SNF) Equipment Recommended: 3 in 1 bedside comode PT Goals  Acute Rehab PT Goals PT Goal Formulation: With patient Pt will go Supine/Side to Sit: with supervision PT Goal: Supine/Side to Sit - Progress: Progressing toward goal Pt will go Sit to Stand: with supervision PT Goal: Sit to Stand - Progress: Met Pt will Ambulate: 51 - 150 feet;with rolling walker;with supervision PT Goal: Ambulate - Progress: Progressing toward goal Pt will Go Up / Down Stairs: 3-5 stairs;with min assist (with rails vs RW depending on which house pt discharges to.) PT Goal: Up/Down Stairs - Progress: Not met (New goal 09/20/11) Pt will Perform Home Exercise Program: with supervision, verbal cues required/provided PT Goal: Perform Home Exercise Program - Progress: Progressing toward goal  PT Treatment Precautions/Restrictions  Precautions Precautions: Anterior Hip Precaution Booklet Issued: No Required Braces or Orthoses: No Restrictions Weight Bearing Restrictions: Yes LLE Weight Bearing: Weight bearing as tolerated Mobility (including Balance) Bed Mobility Sit to Supine: 5: Supervision Sit to Supine - Details (indicate cue type and reason): Did well with getting LE onto bed. Transfers Sit to Stand: 5: Supervision;From chair/3-in-1;From toilet Sit to Stand Details  (indicate cue type and reason): MIN/GUARD Stand to Sit: 4: Min assist;To bed;To toilet Stand to Sit Details: min/guard Ambulation/Gait Ambulation/Gait Assistance: 4: Min assist Ambulation/Gait Assistance Details (indicate cue type and reason): min/guard with shoes on for elevated R shoe. cues to push RW rather than pick it up. Ambulation Distance (Feet): 100 Feet Assistive device: Rolling walker Gait Pattern: Step-through pattern     End of Session PT - End of Session Equipment Utilized During Treatment: Gait belt Activity Tolerance: Patient tolerated treatment well Patient left: in bed;with call bell in reach;with family/visitor present Nurse Communication: Mobility status for transfers;Mobility status for ambulation General Behavior During Session: [facility] for tasks performed Cognition: [facility] for tasks performed  [facility] LUBECK 09/20/2011, 2:38 PM

## 2011-09-20 NOTE — Progress Notes (Signed)
Physical Therapy Treatment Patient Details Name: Christie Nelson MRN: [id number] DOB: June 15, 1930 Today's Date: 09/20/2011 10:40-11:05  PT Assessment/Plan  PT - Assessment/Plan Comments on Treatment Session: Pt did well with PT.  Plan is for SNF probably tomorrow. PT Plan: Discharge plan remains appropriate;Frequency remains appropriate PT Frequency: 7X/week Follow Up Recommendations: Skilled nursing facility Equipment Recommended: Defer to next venue PT Goals  Acute Rehab PT Goals PT Goal Formulation: With patient Pt will go Supine/Side to Sit: with supervision PT Goal: Supine/Side to Sit - Progress: Progressing toward goal Pt will go Sit to Stand: with supervision PT Goal: Sit to Stand - Progress: Progressing toward goal Pt will Ambulate: 51 - 150 feet;with rolling walker;with supervision PT Goal: Ambulate - Progress: Progressing toward goal Pt will Perform Home Exercise Program: with supervision, verbal cues required/provided PT Goal: Perform Home Exercise Program - Progress: Progressing toward goal  PT Treatment Precautions/Restrictions  Precautions Precautions: Anterior Hip Precaution Booklet Issued: No Required Braces or Orthoses: No Restrictions Weight Bearing Restrictions: Yes LLE Weight Bearing: Weight bearing as tolerated Mobility (including Balance) Bed Mobility Supine to Sit: 4: Min assist;With rails;HOB elevated (Comment degrees) (A for L LE only at beginning of transfer) Transfers Sit to Stand: 4: Min assist;From bed;From chair/3-in-1 (MIn/guard) Sit to Stand Details (indicate cue type and reason): MIN/GUARD Stand to Sit: 4: Min assist;To chair/3-in-1 Stand to Sit Details: MIN/GUARD Ambulation/Gait Ambulation/Gait Assistance: 4: Min assist Ambulation/Gait Assistance Details (indicate cue type and reason): MIN/Guard. Pt keeps R heel up.  She states she usually wears a built up shoe on the R. Ambulation Distance (Feet): 50 Feet Assistive device: Rolling  walker Gait Pattern: Step-through pattern (R heel off ground due to leg length discrepancy)    Exercise  Total Joint Exercises Ankle Circles/Pumps: AROM;10 reps;Supine Quad Sets: Strengthening;Left;10 reps;Supine Heel Slides: 10 reps;Left;Supine;AROM Hip ABduction/ADduction: Left;10 reps;Supine;AROM End of Session PT - End of Session Equipment Utilized During Treatment: Gait belt Activity Tolerance: Patient tolerated treatment well Patient left: in chair;with call bell in reach Nurse Communication: Mobility status for transfers;Mobility status for ambulation General Behavior During Session: [facility] for tasks performed Cognition: [facility] for tasks performed  [facility] LUBECK 09/20/2011, 11:29 AM

## 2011-09-21 ENCOUNTER — Encounter (HOSPITAL_COMMUNITY): Payer: Self-pay | Admitting: Orthopedic Surgery

## 2011-09-21 MED ORDER — HYDROCODONE-ACETAMINOPHEN 7.5-325 MG PO TABS: 1.0000 | ORAL_TABLET | ORAL | Status: AC

## 2011-09-21 MED ORDER — METHOCARBAMOL 500 MG PO TABS: 500.0000 mg | ORAL_TABLET | Freq: Four times a day (QID) | ORAL | Status: AC | PRN

## 2011-09-21 MED ORDER — POLYETHYLENE GLYCOL 3350 17 G PO PACK: 17.0000 g | PACK | Freq: Two times a day (BID) | ORAL | Status: AC

## 2011-09-21 MED ORDER — FERROUS SULFATE 325 (65 FE) MG PO TABS: 325.0000 mg | ORAL_TABLET | Freq: Three times a day (TID) | ORAL | Status: DC

## 2011-09-21 MED ORDER — DSS 100 MG PO CAPS: 100.0000 mg | ORAL_CAPSULE | Freq: Two times a day (BID) | ORAL | Status: AC

## 2011-09-21 MED ORDER — DIPHENHYDRAMINE HCL 25 MG PO CAPS: 25.0000 mg | ORAL_CAPSULE | Freq: Four times a day (QID) | ORAL | Status: AC | PRN

## 2011-09-21 MED ORDER — ASPIRIN EC 325 MG PO TBEC: 325.0000 mg | DELAYED_RELEASE_TABLET | Freq: Two times a day (BID) | ORAL | Status: AC

## 2011-09-21 NOTE — Discharge Summary (Signed)
Physician Discharge Summary  Patient ID: Christie Nelson MRN: [id number] DOB/AGE: [birth year] 76 y.o.  Admit date: 09/18/2011 Discharge date: 09/21/2011  Procedures:  Procedure(s) (LRB): TOTAL HIP ARTHROPLASTY ANTERIOR APPROACH (Left)  Attending Physician: Shelda Pal   Admission Diagnoses: Osteoarthritis, left hip.  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  Arthritis.  Reflux.  HPI: This is a 78 year old lady with a history of osteoarthritis of the left hip, which has failed conservative management to alleviate her symptoms. She has had previous right total hip arthroplasty with no problems and a good result, is now scheduled for left total hip arthroplasty by anterior approach. The surgery risk, benefits, and aftercare were discussed in detail with the patient. Questions invited and answered.  PCP: Estanislado Pandy, MD   Discharged Condition: good  Hospital Course:  Patient underwent the above stated procedure on 09/18/2011. Patient tolerated the procedure well and brought to the recovery room in good condition and subsequently to the floor.  POD #1 BP: 119/56 ; Pulse: 69 ; Temp: 98.7 F (37.1 C) ; Resp: 16  Pt's foley was removed, as well as the hemovac drain removed. IV was changed to a saline lock. Patient reports pain as mild. No events. Neurovascular intact, dorsiflexion/plantar flexion intact, incision: dressing C/D/I, no cellulitis present and compartment soft.   LABS  Basename  09/19/11 0431   HGB  9.2*   HCT  28.5*    POD #2  BP: 103/62 ; Pulse: 59 ; Temp: 97.4 F (36.3 C) ; Resp: 18  Patient reports pain as mild. No events. Neurovascular intact, dorsiflexion/plantar flexion intact, incision: dressing C/D/I, no cellulitis present and compartment soft.   LABS  Basename  09/20/11 0437    HGB  8.8*    HCT  27.2*     POD #3  BP: 130/74 ; Pulse: 78 ; Temp: 98.1 F (36.7 C) ;  Resp: 18  Patient reports pain as mild. No events. Feels ready to be discharged home and now feels that she will be safe at home. Neurovascular intact, dorsiflexion/plantar flexion intact, incision: dressing C/D/I, no cellulitis present and compartment soft.   LABS   No new labs  Discharge Exam: Extremities: Homans sign is negative, no sign of DVT, no edema, redness or tenderness in the calves or thighs and no ulcers, gangrene or trophic changes  Disposition: Home with home health PT. Follow up in 2 weeks at [facility].  Discharge Orders    Future Orders Please Complete By Expires   Diet - low sodium heart healthy      Call MD / Call 911      Comments:   If you experience chest pain or shortness of breath, CALL 911 and be transported to the hospital emergency room.  If you develope a fever above 101 F, pus (white drainage) or increased drainage or redness at the wound, or calf pain, call your surgeon's office.   Discharge instructions      Comments:   Maintain surgical dressing for 8 days, then replace with gauze and tape. Keep the area dry and clean until follow up. Follow up in 2 weeks at [facility]. Call with any questions or concerns.     Constipation Prevention      Comments:   Drink plenty of fluids.  Prune juice may be helpful.  You may use a stool softener, such as Colace (over the counter) 100 mg twice a day.  Use MiraLax (over the counter) for constipation as needed.   Increase activity slowly as tolerated      Weight Bearing as taught in Physical Therapy      Comments:   Use a walker or crutches as instructed.   Driving restrictions      Comments:   No driving for 4 weeks   Change dressing      Comments:   Maintain surgical dressing for 8 days, then replace with 4x4 guaze and tape. Keep the area dry and clean.   TED hose      Comments:   Use stockings (TED hose) for 2 weeks on both leg(s).  You may remove them at night for sleeping.       Current Discharge Medication List    START taking these medications   Details  aspirin EC 325 MG tablet Take 1 tablet (325 mg total) by mouth 2 (two) times daily. X 4 weeks Qty: 60 tablet, Refills: 0    diphenhydrAMINE (BENADRYL) 25 mg capsule Take 1 capsule (25 mg total) by mouth every 6 (six) hours as needed for itching, allergies or sleep.    docusate sodium 100 MG CAPS Take 100 mg by mouth 2 (two) times daily.    ferrous sulfate 325 (65 FE) MG tablet Take 1 tablet (325 mg total) by mouth 3 (three) times daily after meals.    HYDROcodone-acetaminophen (NORCO) 7.5-325 MG per tablet Take 1-2 tablets by mouth every 4 (four) hours. Qty: 120 tablet, Refills: 0    methocarbamol (ROBAXIN) 500 MG tablet Take 1 tablet (500 mg total) by mouth every 6 (six) hours as needed (muscle spasms).    polyethylene glycol (MIRALAX / GLYCOLAX) packet Take 17 g by mouth 2 (two) times daily.      CONTINUE these medications which have NOT CHANGED   Details  bromocriptine (PARLODEL) 2.5 MG tablet Take 1.25 mg by mouth daily with breakfast.     pantoprazole (PROTONIX) 40 MG tablet Take 40 mg by mouth every morning.       STOP taking these medications     diclofenac (VOLTAREN) 50 MG EC tablet Comments:  Reason for Stopping:       naproxen sodium (ANAPROX) 220 MG tablet Comments:  Reason for Stopping:            Signed: Anastasio Auerbach. Ravinder Hofland   PAC  09/21/2011, 7:42 AM

## 2011-09-21 NOTE — Progress Notes (Signed)
Subjective: 3 Days Post-Op Procedure(s) (LRB): TOTAL HIP ARTHROPLASTY ANTERIOR APPROACH (Left)   Patient reports pain as mild. No events. Feels ready to be discharged home and now feels that she will be safe at home.  Objective:   VITALS:   Filed Vitals:   09/20/11 2230  BP: 130/74  Pulse: 78  Temp: 98.1 F (36.7 C)  Resp: 18    Neurovascular intact Dorsiflexion/Plantar flexion intact Incision: dressing C/D/I No cellulitis present Compartment soft  LABS  No new labs   Assessment/Plan: 3 Days Post-Op Procedure(s) (LRB): TOTAL HIP ARTHROPLASTY ANTERIOR APPROACH (Left)   Up with therapy Discharge home with home health   Anastasio Auerbach. Klynn Linnemann   PAC  09/21/2011, 7:41 AM

## 2011-09-21 NOTE — Progress Notes (Signed)
Occupational Therapy Treatment Patient Details Name: SHERRYLL SKOCZYLAS MRN: [id number] DOB: 03/20/1930 Today's Date: 09/21/2011 1055 1120 2 [place]  OT Assessment/Plan OT Assessment/Plan Follow Up Recommendations: Home health OT Equipment Recommended: 3 in 1 bedside commode OT Goals ADL Goals Pt Will Perform Grooming: with supervision;Standing at sink ADL Goal: Grooming - Progress: Met Pt Will Transfer to Toilet: with supervision;Ambulation;3-in-1;with DME ADL Goal: Toilet Transfer - Progress: Progressing toward goals Pt Will Perform Toileting - Clothing Manipulation: with supervision;Standing ADL Goal: Toileting - Clothing Manipulation - Progress: Met Pt Will Perform Toileting - Hygiene: with supervision;Sit to stand from 3-in-1/toilet ADL Goal: Toileting - Hygiene - Progress: Met  OT Treatment Precautions/Restrictions  Precautions Precautions: Other (comment) (anterior hip no precautions) Restrictions Weight Bearing Restrictions: Yes LLE Weight Bearing: Weight bearing as tolerated   ADL ADL Grooming: Performed;Wash/dry hands;Supervision/safety Where Assessed - Grooming: Standing at sink Lower Body Dressing: Performed;Minimal assistance;Other (comment) (able to don pants and underwear with reacher) Where Assessed - Lower Body Dressing: Sit to stand from chair Toilet Transfer: Performed;Minimal assistance;Other (comment) (min guard) Toilet Transfer Details (indicate cue type and reason):  (min cues for hand placement) Toilet Transfer Method: Ambulating Toilet Transfer Equipment: Raised toilet seat with arms (or 3-in-1 over toilet) Toileting - Clothing Manipulation: Performed;Supervision/safety Where Assessed - Glass blower/designer Manipulation: Standing Toileting - Hygiene: Performed;Supervision/safety Where Assessed - Toileting Hygiene: Standing Ambulation Related to ADLs: min guard.   ADL Comments: Pt shaky especially with reacher, but no LOB Mobility  Transfers Sit to Stand:  5: Supervision;With upper extremity assist;From chair/3-in-1 Exercises    End of Session General Behavior During Session: [facility] for tasks performed Cognition: [facility] for tasks performed  Patricie Geeslin [phone] 09/21/2011, 2:04 PM

## 2011-09-21 NOTE — Progress Notes (Signed)
CARE MANAGEMENT NOTE 09/21/2011  Patient:  Christie Nelson, Christie Nelson   Account Number:  [id number]  Date Initiated:  09/19/2011  Documentation initiated by:  Jarielys Girardot  Subjective/Objective Assessment:   78 yo female admitted with Left hip osteoarthritis S/P Left total hip replacement.     Action/Plan:   D/C when medically stable.   Anticipated DC Date:  09/21/2011   Anticipated DC Plan:  HOME W HOME HEALTH SERVICES      DC Planning Services  CM consult      PAC Choice  DURABLE MEDICAL EQUIPMENT  HOME HEALTH   Choice offered to / List presented to:  C-1 Patient   DME arranged  3-N-1      DME agency  [facility]     HH arranged  HH-2 PT      [facility] agency  [facility]   Status of service:  Completed, signed off.    Discharge Disposition:  HOME W HOME HEALTH SERVICES    Comments:  09/21/11, [name] RNC-MNN, BSN, [phone], CM received referral.   Pt has decided to go home instead of SNF. CM spoke to pt. and offered choice for [facility] services.  Pt' has selected [facility].  Spoke to [employer]. faxed to [phone].  Confirmation of services received by [name].  [name] at [facility] contacted for delivery of 3N1 to pt's room prior to d/c. 09/19/11, [name] RNC-MNN, BSN, [phone], CM received referral.  There is also a SW consult ordered.  Pt's husband doesn't feel like he can take care of pt. at home. Will follow for plans.

## 2011-09-21 NOTE — Progress Notes (Signed)
MEDICARE-CERTIFIED HOME HEALTH AGENCIES [place]   Agencies that are Medicare-Certified and affiliated with [facility]  Home Health Agency  Telephone Number Address  [facility]  The [facility] has ownership interest in this company; however, you are under no obligation to use this agency. [phone]  [address] [place], [place] [address]    Agencies that are Medicare-Certified and are not affiliated with [facility]   Home Health Agency Telephone Number Address  [facility] [phone] Fax [phone] [address] [place], [place]  [address]  [facility] [phone] [address] [place], [place] [address]  [facility]  [phone] Fax [phone] [address]  [place], [place]  [address]  [facility] [phone] or [phone] [address] [place], [place] [address]  [facility] [phone] or [phone] [address] [place], [place]  [address]      Agencies that are not Medicare-Certified and are not affiliated with The  Health System    Home Health Agency Telephone Number Address  [facility] [phone] Fax [phone] [address] [place], [place]  [address]  [facility] [phone] or [phone] Fax [phone] [address] [place], [place]  [address]  [facility]  [phone] [address] [place], [place]  [facility] [phone] Fax [phone] [address] [place], [place]  [address]  [facility] [phone] Fax [phone] [address] [place], [place]  [address]  [facility] [phone] [address] [place], [place]  [address]  [place] [facility] [phone] Fax [phone] [address] [place], [place] [address]  [facility] [phone] [address] , [place]  [address]  [facility] [phone] Fax [phone] [address] , [place]  [address]    

## 2011-09-21 NOTE — Progress Notes (Signed)
Physical Therapy Treatment Patient Details Name: Christie Nelson MRN: [id number] DOB: [birth year] Today's Date: 09/21/2011  [id number] 2GT,1TE,1SC PT Assessment/Plan  PT - Assessment/Plan Comments on Treatment Session: Reports thinks she will do fine at her own home.  Patient educated on TED stockings and donned prior to OOB.  Also discussed car transfers, HHPT and gradual return to activity.  Wrote out sequence on steps for patient and caregiver and reviewed HEP with handout. PT Plan: Discharge plan remains appropriate PT Frequency: 7X/week Follow Up Recommendations: Home health PT Equipment Recommended: 3 in 1 bedside comode PT Goals  Acute Rehab PT Goals Pt will go Supine/Side to Sit: with supervision PT Goal: Supine/Side to Sit - Progress: Met Pt will go Sit to Stand: with supervision PT Goal: Sit to Stand - Progress: Met Pt will Ambulate: 51 - 150 feet;with supervision;with rolling walker PT Goal: Ambulate - Progress: Met Pt will Go Up / Down Stairs: 3-5 stairs;with min assist;with rail(s) PT Goal: Up/Down Stairs - Progress: Met Pt will Perform Home Exercise Program: with supervision, verbal cues required/provided PT Goal: Perform Home Exercise Program - Progress: Met  PT Treatment Precautions/Restrictions  Precautions Precautions: Other (comment) (No precautions.Marland KitchenMarland KitchenDirect anterior approach) Precaution Booklet Issued: No Required Braces or Orthoses: No Restrictions Weight Bearing Restrictions: Yes LLE Weight Bearing: Weight bearing as tolerated Mobility (including Balance) Bed Mobility Supine to Sit: 5: Supervision Supine to Sit Details (indicate cue type and reason): for safety Transfers Sit to Stand: 5: Supervision;From bed;With upper extremity assist Stand to Sit: To chair/3-in-1;With upper extremity assist;4: Min assist Stand to Sit Details: minguard to 3:1 Ambulation/Gait Ambulation/Gait: Yes Ambulation/Gait Assistance: 5: Supervision Ambulation Distance (Feet):  100 Feet Assistive device: Rolling walker Gait Pattern: Decreased stride length;Step-through pattern Stairs: Yes Stairs Assistance: 4: Min assist Stairs Assistance Details (indicate cue type and reason): cues for technique, sequnce and safety Stair Management Technique: One rail Right;With cane (quad cane belonging to patient) Number of Stairs: 3     Exercise  Total Joint Exercises Ankle Circles/Pumps: AROM;Both;10 reps;Supine Quad Sets: AROM;Both;10 reps;Supine Short Arc Quad: AROM;Left;10 reps;Supine Heel Slides: AROM;Left;10 reps;Supine Hip ABduction/ADduction: AROM;Left;10 reps;Supine End of Session PT - End of Session Equipment Utilized During Treatment: Gait belt Activity Tolerance: Patient tolerated treatment well Patient left: in chair General Behavior During Session: [facility] for tasks performed Cognition: [facility] for tasks performed  [facility] 09/21/2011, 10:14 AM

## 2011-09-21 NOTE — Progress Notes (Signed)
Patient provided with discharge teaching and prescription. Patient verbalized understanding. Patient discharged to home.  

## 2015-01-04 DIAGNOSIS — E782 Mixed hyperlipidemia: Secondary | ICD-10-CM | POA: Diagnosis not present

## 2015-01-04 DIAGNOSIS — K210 Gastro-esophageal reflux disease with esophagitis: Secondary | ICD-10-CM | POA: Diagnosis not present

## 2015-01-04 DIAGNOSIS — N183 Chronic kidney disease, stage 3 (moderate): Secondary | ICD-10-CM | POA: Diagnosis not present

## 2015-01-11 DIAGNOSIS — E782 Mixed hyperlipidemia: Secondary | ICD-10-CM | POA: Diagnosis not present

## 2015-01-11 DIAGNOSIS — F411 Generalized anxiety disorder: Secondary | ICD-10-CM | POA: Diagnosis not present

## 2015-01-11 DIAGNOSIS — Z1389 Encounter for screening for other disorder: Secondary | ICD-10-CM | POA: Diagnosis not present

## 2015-01-11 DIAGNOSIS — K210 Gastro-esophageal reflux disease with esophagitis: Secondary | ICD-10-CM | POA: Diagnosis not present

## 2015-01-11 DIAGNOSIS — N183 Chronic kidney disease, stage 3 (moderate): Secondary | ICD-10-CM | POA: Diagnosis not present

## 2015-01-11 DIAGNOSIS — D443 Neoplasm of uncertain behavior of pituitary gland: Secondary | ICD-10-CM | POA: Diagnosis not present

## 2015-01-12 DIAGNOSIS — N183 Chronic kidney disease, stage 3 (moderate): Secondary | ICD-10-CM | POA: Diagnosis not present

## 2015-07-07 DIAGNOSIS — I10 Essential (primary) hypertension: Secondary | ICD-10-CM | POA: Diagnosis not present

## 2015-07-07 DIAGNOSIS — E7800 Pure hypercholesterolemia, unspecified: Secondary | ICD-10-CM | POA: Diagnosis not present

## 2015-07-07 DIAGNOSIS — N183 Chronic kidney disease, stage 3 (moderate): Secondary | ICD-10-CM | POA: Diagnosis not present

## 2015-07-07 DIAGNOSIS — K210 Gastro-esophageal reflux disease with esophagitis: Secondary | ICD-10-CM | POA: Diagnosis not present

## 2015-07-07 DIAGNOSIS — E782 Mixed hyperlipidemia: Secondary | ICD-10-CM | POA: Diagnosis not present

## 2015-07-13 DIAGNOSIS — F411 Generalized anxiety disorder: Secondary | ICD-10-CM | POA: Diagnosis not present

## 2015-07-13 DIAGNOSIS — E782 Mixed hyperlipidemia: Secondary | ICD-10-CM | POA: Diagnosis not present

## 2015-07-13 DIAGNOSIS — Z23 Encounter for immunization: Secondary | ICD-10-CM | POA: Diagnosis not present

## 2015-07-13 DIAGNOSIS — K210 Gastro-esophageal reflux disease with esophagitis: Secondary | ICD-10-CM | POA: Diagnosis not present

## 2015-07-13 DIAGNOSIS — D443 Neoplasm of uncertain behavior of pituitary gland: Secondary | ICD-10-CM | POA: Diagnosis not present

## 2015-07-13 DIAGNOSIS — N183 Chronic kidney disease, stage 3 (moderate): Secondary | ICD-10-CM | POA: Diagnosis not present

## 2015-10-10 DIAGNOSIS — J209 Acute bronchitis, unspecified: Secondary | ICD-10-CM | POA: Diagnosis not present

## 2015-10-10 DIAGNOSIS — D485 Neoplasm of uncertain behavior of skin: Secondary | ICD-10-CM | POA: Diagnosis not present

## 2015-10-13 DIAGNOSIS — D225 Melanocytic nevi of trunk: Secondary | ICD-10-CM | POA: Diagnosis not present

## 2015-10-13 DIAGNOSIS — C44311 Basal cell carcinoma of skin of nose: Secondary | ICD-10-CM | POA: Diagnosis not present

## 2015-10-13 DIAGNOSIS — L821 Other seborrheic keratosis: Secondary | ICD-10-CM | POA: Diagnosis not present

## 2015-11-10 DIAGNOSIS — Z85828 Personal history of other malignant neoplasm of skin: Secondary | ICD-10-CM | POA: Diagnosis not present

## 2015-11-10 DIAGNOSIS — Z08 Encounter for follow-up examination after completed treatment for malignant neoplasm: Secondary | ICD-10-CM | POA: Diagnosis not present

## 2016-01-05 DIAGNOSIS — E7800 Pure hypercholesterolemia, unspecified: Secondary | ICD-10-CM | POA: Diagnosis not present

## 2016-01-05 DIAGNOSIS — K210 Gastro-esophageal reflux disease with esophagitis: Secondary | ICD-10-CM | POA: Diagnosis not present

## 2016-01-05 DIAGNOSIS — N183 Chronic kidney disease, stage 3 (moderate): Secondary | ICD-10-CM | POA: Diagnosis not present

## 2016-01-05 DIAGNOSIS — I10 Essential (primary) hypertension: Secondary | ICD-10-CM | POA: Diagnosis not present

## 2016-01-05 DIAGNOSIS — E782 Mixed hyperlipidemia: Secondary | ICD-10-CM | POA: Diagnosis not present

## 2016-01-11 DIAGNOSIS — D443 Neoplasm of uncertain behavior of pituitary gland: Secondary | ICD-10-CM | POA: Diagnosis not present

## 2016-01-11 DIAGNOSIS — K210 Gastro-esophageal reflux disease with esophagitis: Secondary | ICD-10-CM | POA: Diagnosis not present

## 2016-01-11 DIAGNOSIS — N183 Chronic kidney disease, stage 3 (moderate): Secondary | ICD-10-CM | POA: Diagnosis not present

## 2016-01-11 DIAGNOSIS — F411 Generalized anxiety disorder: Secondary | ICD-10-CM | POA: Diagnosis not present

## 2016-01-11 DIAGNOSIS — E782 Mixed hyperlipidemia: Secondary | ICD-10-CM | POA: Diagnosis not present

## 2016-02-03 DIAGNOSIS — R1084 Generalized abdominal pain: Secondary | ICD-10-CM | POA: Diagnosis not present

## 2016-02-03 DIAGNOSIS — R140 Abdominal distension (gaseous): Secondary | ICD-10-CM | POA: Diagnosis not present

## 2016-02-10 DIAGNOSIS — N261 Atrophy of kidney (terminal): Secondary | ICD-10-CM | POA: Diagnosis not present

## 2016-02-10 DIAGNOSIS — R140 Abdominal distension (gaseous): Secondary | ICD-10-CM | POA: Diagnosis not present

## 2016-02-10 DIAGNOSIS — R1084 Generalized abdominal pain: Secondary | ICD-10-CM | POA: Diagnosis not present

## 2016-02-10 DIAGNOSIS — K8020 Calculus of gallbladder without cholecystitis without obstruction: Secondary | ICD-10-CM | POA: Diagnosis not present

## 2016-02-10 DIAGNOSIS — K8080 Other cholelithiasis without obstruction: Secondary | ICD-10-CM | POA: Diagnosis not present

## 2016-02-17 DIAGNOSIS — R1084 Generalized abdominal pain: Secondary | ICD-10-CM | POA: Diagnosis not present

## 2016-02-17 DIAGNOSIS — R109 Unspecified abdominal pain: Secondary | ICD-10-CM | POA: Diagnosis not present

## 2016-02-17 DIAGNOSIS — R140 Abdominal distension (gaseous): Secondary | ICD-10-CM | POA: Diagnosis not present

## 2016-06-20 DIAGNOSIS — H538 Other visual disturbances: Secondary | ICD-10-CM | POA: Diagnosis not present

## 2016-06-20 DIAGNOSIS — Z961 Presence of intraocular lens: Secondary | ICD-10-CM | POA: Diagnosis not present

## 2016-07-10 DIAGNOSIS — I10 Essential (primary) hypertension: Secondary | ICD-10-CM | POA: Diagnosis not present

## 2016-07-10 DIAGNOSIS — K210 Gastro-esophageal reflux disease with esophagitis: Secondary | ICD-10-CM | POA: Diagnosis not present

## 2016-07-10 DIAGNOSIS — D443 Neoplasm of uncertain behavior of pituitary gland: Secondary | ICD-10-CM | POA: Diagnosis not present

## 2016-07-10 DIAGNOSIS — E7800 Pure hypercholesterolemia, unspecified: Secondary | ICD-10-CM | POA: Diagnosis not present

## 2016-07-10 DIAGNOSIS — E782 Mixed hyperlipidemia: Secondary | ICD-10-CM | POA: Diagnosis not present

## 2016-07-18 DIAGNOSIS — D443 Neoplasm of uncertain behavior of pituitary gland: Secondary | ICD-10-CM | POA: Diagnosis not present

## 2016-07-18 DIAGNOSIS — E782 Mixed hyperlipidemia: Secondary | ICD-10-CM | POA: Diagnosis not present

## 2016-07-18 DIAGNOSIS — K210 Gastro-esophageal reflux disease with esophagitis: Secondary | ICD-10-CM | POA: Diagnosis not present

## 2016-07-18 DIAGNOSIS — N183 Chronic kidney disease, stage 3 (moderate): Secondary | ICD-10-CM | POA: Diagnosis not present

## 2017-01-04 DIAGNOSIS — Z79899 Other long term (current) drug therapy: Secondary | ICD-10-CM | POA: Diagnosis not present

## 2017-01-04 DIAGNOSIS — I10 Essential (primary) hypertension: Secondary | ICD-10-CM | POA: Diagnosis not present

## 2017-01-04 DIAGNOSIS — M159 Polyosteoarthritis, unspecified: Secondary | ICD-10-CM | POA: Diagnosis not present

## 2017-01-04 DIAGNOSIS — R69 Illness, unspecified: Secondary | ICD-10-CM | POA: Diagnosis not present

## 2017-01-04 DIAGNOSIS — Z0000 Encounter for general adult medical examination without abnormal findings: Secondary | ICD-10-CM | POA: Diagnosis not present

## 2017-01-04 DIAGNOSIS — K08109 Complete loss of teeth, unspecified cause, unspecified class: Secondary | ICD-10-CM | POA: Diagnosis not present

## 2017-01-04 DIAGNOSIS — L814 Other melanin hyperpigmentation: Secondary | ICD-10-CM | POA: Diagnosis not present

## 2017-01-04 DIAGNOSIS — Z87891 Personal history of nicotine dependence: Secondary | ICD-10-CM | POA: Diagnosis not present

## 2017-01-04 DIAGNOSIS — R51 Headache: Secondary | ICD-10-CM | POA: Diagnosis not present

## 2017-01-04 DIAGNOSIS — Z6822 Body mass index (BMI) 22.0-22.9, adult: Secondary | ICD-10-CM | POA: Diagnosis not present

## 2017-01-10 DIAGNOSIS — N183 Chronic kidney disease, stage 3 (moderate): Secondary | ICD-10-CM | POA: Diagnosis not present

## 2017-01-10 DIAGNOSIS — E7800 Pure hypercholesterolemia, unspecified: Secondary | ICD-10-CM | POA: Diagnosis not present

## 2017-01-10 DIAGNOSIS — I10 Essential (primary) hypertension: Secondary | ICD-10-CM | POA: Diagnosis not present

## 2017-01-10 DIAGNOSIS — K210 Gastro-esophageal reflux disease with esophagitis: Secondary | ICD-10-CM | POA: Diagnosis not present

## 2017-01-10 DIAGNOSIS — E782 Mixed hyperlipidemia: Secondary | ICD-10-CM | POA: Diagnosis not present

## 2017-01-15 DIAGNOSIS — E782 Mixed hyperlipidemia: Secondary | ICD-10-CM | POA: Diagnosis not present

## 2017-01-15 DIAGNOSIS — Z6822 Body mass index (BMI) 22.0-22.9, adult: Secondary | ICD-10-CM | POA: Diagnosis not present

## 2017-01-15 DIAGNOSIS — K210 Gastro-esophageal reflux disease with esophagitis: Secondary | ICD-10-CM | POA: Diagnosis not present

## 2017-01-15 DIAGNOSIS — D443 Neoplasm of uncertain behavior of pituitary gland: Secondary | ICD-10-CM | POA: Diagnosis not present

## 2017-01-15 DIAGNOSIS — N183 Chronic kidney disease, stage 3 (moderate): Secondary | ICD-10-CM | POA: Diagnosis not present

## 2017-01-15 DIAGNOSIS — R69 Illness, unspecified: Secondary | ICD-10-CM | POA: Diagnosis not present

## 2017-01-15 DIAGNOSIS — I10 Essential (primary) hypertension: Secondary | ICD-10-CM | POA: Diagnosis not present

## 2017-05-14 DIAGNOSIS — K210 Gastro-esophageal reflux disease with esophagitis: Secondary | ICD-10-CM | POA: Diagnosis not present

## 2017-05-14 DIAGNOSIS — E782 Mixed hyperlipidemia: Secondary | ICD-10-CM | POA: Diagnosis not present

## 2017-05-14 DIAGNOSIS — N183 Chronic kidney disease, stage 3 (moderate): Secondary | ICD-10-CM | POA: Diagnosis not present

## 2017-05-14 DIAGNOSIS — E7800 Pure hypercholesterolemia, unspecified: Secondary | ICD-10-CM | POA: Diagnosis not present

## 2017-05-14 DIAGNOSIS — I10 Essential (primary) hypertension: Secondary | ICD-10-CM | POA: Diagnosis not present

## 2017-05-16 DIAGNOSIS — D443 Neoplasm of uncertain behavior of pituitary gland: Secondary | ICD-10-CM | POA: Diagnosis not present

## 2017-05-16 DIAGNOSIS — N183 Chronic kidney disease, stage 3 (moderate): Secondary | ICD-10-CM | POA: Diagnosis not present

## 2017-05-16 DIAGNOSIS — I10 Essential (primary) hypertension: Secondary | ICD-10-CM | POA: Diagnosis not present

## 2017-05-16 DIAGNOSIS — R69 Illness, unspecified: Secondary | ICD-10-CM | POA: Diagnosis not present

## 2017-05-16 DIAGNOSIS — Z6822 Body mass index (BMI) 22.0-22.9, adult: Secondary | ICD-10-CM | POA: Diagnosis not present

## 2017-05-16 DIAGNOSIS — E782 Mixed hyperlipidemia: Secondary | ICD-10-CM | POA: Diagnosis not present

## 2017-05-16 DIAGNOSIS — Z23 Encounter for immunization: Secondary | ICD-10-CM | POA: Diagnosis not present

## 2017-09-12 DIAGNOSIS — K210 Gastro-esophageal reflux disease with esophagitis: Secondary | ICD-10-CM | POA: Diagnosis not present

## 2017-09-12 DIAGNOSIS — E782 Mixed hyperlipidemia: Secondary | ICD-10-CM | POA: Diagnosis not present

## 2017-09-12 DIAGNOSIS — E7800 Pure hypercholesterolemia, unspecified: Secondary | ICD-10-CM | POA: Diagnosis not present

## 2017-09-12 DIAGNOSIS — I10 Essential (primary) hypertension: Secondary | ICD-10-CM | POA: Diagnosis not present

## 2017-09-12 DIAGNOSIS — N183 Chronic kidney disease, stage 3 (moderate): Secondary | ICD-10-CM | POA: Diagnosis not present

## 2017-09-16 DIAGNOSIS — N183 Chronic kidney disease, stage 3 (moderate): Secondary | ICD-10-CM | POA: Diagnosis not present

## 2017-09-16 DIAGNOSIS — I10 Essential (primary) hypertension: Secondary | ICD-10-CM | POA: Diagnosis not present

## 2017-09-16 DIAGNOSIS — R69 Illness, unspecified: Secondary | ICD-10-CM | POA: Diagnosis not present

## 2017-09-16 DIAGNOSIS — D443 Neoplasm of uncertain behavior of pituitary gland: Secondary | ICD-10-CM | POA: Diagnosis not present

## 2017-09-16 DIAGNOSIS — K210 Gastro-esophageal reflux disease with esophagitis: Secondary | ICD-10-CM | POA: Diagnosis not present

## 2017-09-16 DIAGNOSIS — Z23 Encounter for immunization: Secondary | ICD-10-CM | POA: Diagnosis not present

## 2017-09-16 DIAGNOSIS — Z0001 Encounter for general adult medical examination with abnormal findings: Secondary | ICD-10-CM | POA: Diagnosis not present

## 2017-09-16 DIAGNOSIS — K5901 Slow transit constipation: Secondary | ICD-10-CM | POA: Diagnosis not present

## 2017-09-16 DIAGNOSIS — E782 Mixed hyperlipidemia: Secondary | ICD-10-CM | POA: Diagnosis not present

## 2018-01-13 DIAGNOSIS — I10 Essential (primary) hypertension: Secondary | ICD-10-CM | POA: Diagnosis not present

## 2018-01-13 DIAGNOSIS — E782 Mixed hyperlipidemia: Secondary | ICD-10-CM | POA: Diagnosis not present

## 2018-01-13 DIAGNOSIS — K210 Gastro-esophageal reflux disease with esophagitis: Secondary | ICD-10-CM | POA: Diagnosis not present

## 2018-01-13 DIAGNOSIS — N183 Chronic kidney disease, stage 3 (moderate): Secondary | ICD-10-CM | POA: Diagnosis not present

## 2018-01-13 DIAGNOSIS — E7800 Pure hypercholesterolemia, unspecified: Secondary | ICD-10-CM | POA: Diagnosis not present

## 2018-01-16 DIAGNOSIS — L570 Actinic keratosis: Secondary | ICD-10-CM | POA: Diagnosis not present

## 2018-01-16 DIAGNOSIS — I10 Essential (primary) hypertension: Secondary | ICD-10-CM | POA: Diagnosis not present

## 2018-01-16 DIAGNOSIS — E782 Mixed hyperlipidemia: Secondary | ICD-10-CM | POA: Diagnosis not present

## 2018-01-16 DIAGNOSIS — D443 Neoplasm of uncertain behavior of pituitary gland: Secondary | ICD-10-CM | POA: Diagnosis not present

## 2018-01-16 DIAGNOSIS — Z6822 Body mass index (BMI) 22.0-22.9, adult: Secondary | ICD-10-CM | POA: Diagnosis not present

## 2018-01-16 DIAGNOSIS — Z1389 Encounter for screening for other disorder: Secondary | ICD-10-CM | POA: Diagnosis not present

## 2018-01-16 DIAGNOSIS — Z1331 Encounter for screening for depression: Secondary | ICD-10-CM | POA: Diagnosis not present

## 2018-04-24 DIAGNOSIS — Z961 Presence of intraocular lens: Secondary | ICD-10-CM | POA: Diagnosis not present

## 2018-06-14 DIAGNOSIS — M25552 Pain in left hip: Secondary | ICD-10-CM | POA: Diagnosis not present

## 2018-06-14 DIAGNOSIS — N390 Urinary tract infection, site not specified: Secondary | ICD-10-CM | POA: Diagnosis not present

## 2018-06-14 DIAGNOSIS — S2232XA Fracture of one rib, left side, initial encounter for closed fracture: Secondary | ICD-10-CM | POA: Diagnosis not present

## 2018-06-14 DIAGNOSIS — W06XXXA Fall from bed, initial encounter: Secondary | ICD-10-CM | POA: Diagnosis not present

## 2018-06-14 DIAGNOSIS — E876 Hypokalemia: Secondary | ICD-10-CM | POA: Diagnosis not present

## 2018-06-14 DIAGNOSIS — S79912A Unspecified injury of left hip, initial encounter: Secondary | ICD-10-CM | POA: Diagnosis not present

## 2018-06-14 DIAGNOSIS — M545 Low back pain: Secondary | ICD-10-CM | POA: Diagnosis not present

## 2018-06-14 DIAGNOSIS — R319 Hematuria, unspecified: Secondary | ICD-10-CM | POA: Diagnosis not present

## 2018-06-14 DIAGNOSIS — S3992XA Unspecified injury of lower back, initial encounter: Secondary | ICD-10-CM | POA: Diagnosis not present

## 2018-07-02 DIAGNOSIS — Z23 Encounter for immunization: Secondary | ICD-10-CM | POA: Diagnosis not present

## 2018-07-02 DIAGNOSIS — Z6824 Body mass index (BMI) 24.0-24.9, adult: Secondary | ICD-10-CM | POA: Diagnosis not present

## 2018-07-02 DIAGNOSIS — S2232XA Fracture of one rib, left side, initial encounter for closed fracture: Secondary | ICD-10-CM | POA: Diagnosis not present

## 2018-07-15 DIAGNOSIS — R69 Illness, unspecified: Secondary | ICD-10-CM | POA: Diagnosis not present

## 2018-07-15 DIAGNOSIS — I10 Essential (primary) hypertension: Secondary | ICD-10-CM | POA: Diagnosis not present

## 2018-07-15 DIAGNOSIS — N183 Chronic kidney disease, stage 3 (moderate): Secondary | ICD-10-CM | POA: Diagnosis not present

## 2018-07-15 DIAGNOSIS — E782 Mixed hyperlipidemia: Secondary | ICD-10-CM | POA: Diagnosis not present

## 2018-07-15 DIAGNOSIS — K210 Gastro-esophageal reflux disease with esophagitis: Secondary | ICD-10-CM | POA: Diagnosis not present

## 2018-07-18 DIAGNOSIS — N183 Chronic kidney disease, stage 3 (moderate): Secondary | ICD-10-CM | POA: Diagnosis not present

## 2018-07-18 DIAGNOSIS — R69 Illness, unspecified: Secondary | ICD-10-CM | POA: Diagnosis not present

## 2018-07-18 DIAGNOSIS — K5901 Slow transit constipation: Secondary | ICD-10-CM | POA: Diagnosis not present

## 2018-07-18 DIAGNOSIS — D443 Neoplasm of uncertain behavior of pituitary gland: Secondary | ICD-10-CM | POA: Diagnosis not present

## 2018-07-18 DIAGNOSIS — I10 Essential (primary) hypertension: Secondary | ICD-10-CM | POA: Diagnosis not present

## 2018-07-18 DIAGNOSIS — Z6824 Body mass index (BMI) 24.0-24.9, adult: Secondary | ICD-10-CM | POA: Diagnosis not present

## 2018-07-18 DIAGNOSIS — Z23 Encounter for immunization: Secondary | ICD-10-CM | POA: Diagnosis not present

## 2018-07-21 DIAGNOSIS — R4182 Altered mental status, unspecified: Secondary | ICD-10-CM | POA: Diagnosis not present

## 2018-07-21 DIAGNOSIS — Z96643 Presence of artificial hip joint, bilateral: Secondary | ICD-10-CM | POA: Diagnosis not present

## 2018-07-21 DIAGNOSIS — M1711 Unilateral primary osteoarthritis, right knee: Secondary | ICD-10-CM | POA: Diagnosis not present

## 2018-07-21 DIAGNOSIS — R410 Disorientation, unspecified: Secondary | ICD-10-CM | POA: Diagnosis not present

## 2018-07-21 DIAGNOSIS — Z87891 Personal history of nicotine dependence: Secondary | ICD-10-CM | POA: Diagnosis not present

## 2018-07-21 DIAGNOSIS — W19XXXA Unspecified fall, initial encounter: Secondary | ICD-10-CM | POA: Diagnosis not present

## 2018-07-21 DIAGNOSIS — M7989 Other specified soft tissue disorders: Secondary | ICD-10-CM | POA: Diagnosis not present

## 2018-07-21 DIAGNOSIS — M25561 Pain in right knee: Secondary | ICD-10-CM | POA: Diagnosis not present

## 2018-07-21 DIAGNOSIS — I4510 Unspecified right bundle-branch block: Secondary | ICD-10-CM | POA: Diagnosis not present

## 2018-07-21 DIAGNOSIS — S8991XA Unspecified injury of right lower leg, initial encounter: Secondary | ICD-10-CM | POA: Diagnosis not present

## 2018-07-21 DIAGNOSIS — R42 Dizziness and giddiness: Secondary | ICD-10-CM | POA: Diagnosis not present

## 2018-07-29 DIAGNOSIS — M25561 Pain in right knee: Secondary | ICD-10-CM | POA: Diagnosis not present

## 2018-09-09 DIAGNOSIS — M7661 Achilles tendinitis, right leg: Secondary | ICD-10-CM | POA: Diagnosis not present

## 2018-09-09 DIAGNOSIS — M1711 Unilateral primary osteoarthritis, right knee: Secondary | ICD-10-CM | POA: Diagnosis not present

## 2019-02-05 DIAGNOSIS — K210 Gastro-esophageal reflux disease with esophagitis: Secondary | ICD-10-CM | POA: Diagnosis not present

## 2019-02-05 DIAGNOSIS — Z6823 Body mass index (BMI) 23.0-23.9, adult: Secondary | ICD-10-CM | POA: Diagnosis not present

## 2019-02-05 DIAGNOSIS — J069 Acute upper respiratory infection, unspecified: Secondary | ICD-10-CM | POA: Diagnosis not present

## 2019-02-05 DIAGNOSIS — R69 Illness, unspecified: Secondary | ICD-10-CM | POA: Diagnosis not present

## 2019-03-24 DIAGNOSIS — R600 Localized edema: Secondary | ICD-10-CM | POA: Diagnosis not present

## 2019-03-24 DIAGNOSIS — Z6823 Body mass index (BMI) 23.0-23.9, adult: Secondary | ICD-10-CM | POA: Diagnosis not present

## 2019-03-24 DIAGNOSIS — R609 Edema, unspecified: Secondary | ICD-10-CM | POA: Diagnosis not present

## 2019-03-24 DIAGNOSIS — I10 Essential (primary) hypertension: Secondary | ICD-10-CM | POA: Diagnosis not present

## 2019-09-23 DIAGNOSIS — Z6824 Body mass index (BMI) 24.0-24.9, adult: Secondary | ICD-10-CM | POA: Diagnosis not present

## 2019-09-23 DIAGNOSIS — R69 Illness, unspecified: Secondary | ICD-10-CM | POA: Diagnosis not present

## 2019-09-23 DIAGNOSIS — R443 Hallucinations, unspecified: Secondary | ICD-10-CM | POA: Diagnosis not present

## 2019-10-13 DIAGNOSIS — R69 Illness, unspecified: Secondary | ICD-10-CM | POA: Diagnosis not present

## 2019-10-13 DIAGNOSIS — Z6823 Body mass index (BMI) 23.0-23.9, adult: Secondary | ICD-10-CM | POA: Diagnosis not present

## 2019-10-13 DIAGNOSIS — Z0001 Encounter for general adult medical examination with abnormal findings: Secondary | ICD-10-CM | POA: Diagnosis not present

## 2019-10-13 DIAGNOSIS — R443 Hallucinations, unspecified: Secondary | ICD-10-CM | POA: Diagnosis not present

## 2019-10-14 DIAGNOSIS — Z23 Encounter for immunization: Secondary | ICD-10-CM | POA: Diagnosis not present

## 2019-10-15 ENCOUNTER — Other Ambulatory Visit (HOSPITAL_COMMUNITY): Payer: Self-pay | Admitting: Family Medicine

## 2019-10-15 ENCOUNTER — Other Ambulatory Visit: Payer: Self-pay | Admitting: Family Medicine

## 2019-10-15 DIAGNOSIS — F0390 Unspecified dementia without behavioral disturbance: Secondary | ICD-10-CM

## 2019-10-29 ENCOUNTER — Ambulatory Visit (HOSPITAL_COMMUNITY): Payer: Medicare HMO

## 2019-11-19 ENCOUNTER — Ambulatory Visit (HOSPITAL_COMMUNITY)
Admission: RE | Admit: 2019-11-19 | Discharge: 2019-11-19 | Disposition: A | Discharge status: Home / Self Care | Payer: Medicare HMO | Source: Ambulatory Visit | Attending: Family Medicine | Admitting: Family Medicine

## 2019-11-19 ENCOUNTER — Other Ambulatory Visit: Payer: Self-pay

## 2019-11-19 DIAGNOSIS — R443 Hallucinations, unspecified: Secondary | ICD-10-CM | POA: Diagnosis not present

## 2019-11-19 DIAGNOSIS — F0390 Unspecified dementia without behavioral disturbance: Secondary | ICD-10-CM | POA: Diagnosis not present

## 2019-11-19 DIAGNOSIS — R69 Illness, unspecified: Secondary | ICD-10-CM | POA: Diagnosis not present

## 2019-11-19 DIAGNOSIS — R410 Disorientation, unspecified: Secondary | ICD-10-CM | POA: Diagnosis not present

## 2019-11-23 DIAGNOSIS — I10 Essential (primary) hypertension: Secondary | ICD-10-CM | POA: Diagnosis not present

## 2019-11-23 DIAGNOSIS — R443 Hallucinations, unspecified: Secondary | ICD-10-CM | POA: Diagnosis not present

## 2019-11-23 DIAGNOSIS — N183 Chronic kidney disease, stage 3 unspecified: Secondary | ICD-10-CM | POA: Diagnosis not present

## 2019-11-23 DIAGNOSIS — R69 Illness, unspecified: Secondary | ICD-10-CM | POA: Diagnosis not present

## 2019-11-23 DIAGNOSIS — Z6823 Body mass index (BMI) 23.0-23.9, adult: Secondary | ICD-10-CM | POA: Diagnosis not present

## 2019-11-23 DIAGNOSIS — J309 Allergic rhinitis, unspecified: Secondary | ICD-10-CM | POA: Diagnosis not present

## 2019-12-09 DIAGNOSIS — I10 Essential (primary) hypertension: Secondary | ICD-10-CM | POA: Diagnosis not present

## 2019-12-09 DIAGNOSIS — R69 Illness, unspecified: Secondary | ICD-10-CM | POA: Diagnosis not present

## 2020-01-04 ENCOUNTER — Encounter (HOSPITAL_COMMUNITY): Payer: Self-pay

## 2020-01-04 ENCOUNTER — Emergency Department (HOSPITAL_COMMUNITY)
Admission: EM | Admit: 2020-01-04 | Discharge: 2020-01-04 | Disposition: A | Discharge status: Home / Self Care | Payer: Medicare HMO | Attending: Emergency Medicine | Admitting: Emergency Medicine

## 2020-01-04 ENCOUNTER — Emergency Department (HOSPITAL_COMMUNITY): Payer: Medicare HMO

## 2020-01-04 ENCOUNTER — Other Ambulatory Visit: Payer: Self-pay

## 2020-01-04 DIAGNOSIS — M25551 Pain in right hip: Secondary | ICD-10-CM | POA: Diagnosis not present

## 2020-01-04 DIAGNOSIS — S3992XA Unspecified injury of lower back, initial encounter: Secondary | ICD-10-CM | POA: Diagnosis not present

## 2020-01-04 DIAGNOSIS — Z96642 Presence of left artificial hip joint: Secondary | ICD-10-CM | POA: Insufficient documentation

## 2020-01-04 DIAGNOSIS — S79912A Unspecified injury of left hip, initial encounter: Secondary | ICD-10-CM | POA: Diagnosis not present

## 2020-01-04 DIAGNOSIS — S79921A Unspecified injury of right thigh, initial encounter: Secondary | ICD-10-CM | POA: Diagnosis not present

## 2020-01-04 DIAGNOSIS — Z87891 Personal history of nicotine dependence: Secondary | ICD-10-CM | POA: Insufficient documentation

## 2020-01-04 DIAGNOSIS — Z79899 Other long term (current) drug therapy: Secondary | ICD-10-CM | POA: Diagnosis not present

## 2020-01-04 DIAGNOSIS — W07XXXA Fall from chair, initial encounter: Secondary | ICD-10-CM | POA: Insufficient documentation

## 2020-01-04 DIAGNOSIS — Z8673 Personal history of transient ischemic attack (TIA), and cerebral infarction without residual deficits: Secondary | ICD-10-CM | POA: Diagnosis not present

## 2020-01-04 DIAGNOSIS — W19XXXA Unspecified fall, initial encounter: Secondary | ICD-10-CM

## 2020-01-04 DIAGNOSIS — I10 Essential (primary) hypertension: Secondary | ICD-10-CM | POA: Insufficient documentation

## 2020-01-04 HISTORY — DX: Essential (primary) hypertension: I10

## 2020-01-04 HISTORY — DX: Cerebral infarction, unspecified: I63.9

## 2020-01-04 MED ORDER — TRAMADOL HCL 50 MG PO TABS: 50.0000 mg | ORAL_TABLET | Freq: Four times a day (QID) | ORAL | 0 refills | Status: DC | PRN

## 2020-01-04 NOTE — Discharge Instructions (Signed)
Dr. Ihor Gully with your orthopedic surgeon your surgery was in 2013.  Take tramadol as needed for pain.  Today's x-rays without any bony abnormalities.  Make an appointment to follow-up with orthopedics.  Information provided above.  Return for any new or worse symptoms.

## 2020-01-04 NOTE — ED Triage Notes (Signed)
Pt presents to ED after a fall from 3 weeks ago. Pt states her right upper leg and right lower back is still hurting from fall.

## 2020-01-04 NOTE — ED Provider Notes (Signed)
[facility] EMERGENCY DEPARTMENT Provider Note   CSN: [id number] Arrival date & time: 01/04/20  [id number]     History Chief Complaint  Patient presents with  . Fall    Christie Nelson is a 86 y.o. female.  Patient with a fall outside about 3 weeks ago.  Was pushing up to get out of the chair and the chair rolled over on her she fell on her right side.  Patient had some bruising to her right thigh hip lower back area on the right side.  Patient still struggling with some pain in that area.  So brought in to make sure there was no significant injury.  According to records patient in 2013 had a right hip replacement done by Dr. Ihor Gully.  Patient does have a walker to use at home.  Patient's been taking Tylenol for pain.        Past Medical History:  Diagnosis Date  . Arthritis   . GERD (gastroesophageal reflux disease)   . Hypertension   . Prolactinoma ([place])   . Stroke [facility])     Patient Active Problem List   Diagnosis Date Noted  . S/P left hip replacement 09/18/2011    Past Surgical History:  Procedure Laterality Date  . ABDOMINAL HYSTERECTOMY  yrs ago  . CATARACT EXTRACTION W/ INTRAOCULAR LENS  IMPLANT, BILATERAL  2008   both eyes done  . RECTOCELE REPAIR  yrs ago  . right hip arthroplasty  2003  . TONSILLECTOMY  age 51  . TOTAL HIP ARTHROPLASTY  09/18/2011   Procedure: TOTAL HIP ARTHROPLASTY ANTERIOR APPROACH;  Surgeon: Mauri Pole;  Location: [facility];  Service: Orthopedics;  Laterality: Left;     OB History   No obstetric history on file.     No family history on file.  Social History   Tobacco Use  . Smoking status: Former Smoker    Packs/day: 0.25    Years: 30.00    Pack years: 7.50    Quit date: 09/11/1987    Years since quitting: 32.3  . Smokeless tobacco: Never Used  Substance Use Topics  . Alcohol use: No  . Drug use: No    Home Medications Prior to Admission medications   Medication Sig Start Date End Date Taking? Authorizing Provider    famotidine (PEPCID) 40 MG tablet Take 40 mg by mouth every morning. 12/17/19  Yes [provider]  fluticasone (FLONASE) 50 MCG/ACT nasal spray Place 2 sprays into both nostrils daily. 12/17/19  Yes [provider]  hydrochlorothiazide (MICROZIDE) 12.5 MG capsule Take 12.5 mg by mouth every morning. 12/17/19  Yes [provider]  QUEtiapine (SEROQUEL) 25 MG tablet Take 25 mg by mouth at bedtime. 12/17/19  Yes [provider]  sertraline (ZOLOFT) 50 MG tablet Take 1 tablet by mouth daily. 05/29/18  Yes [provider]  traMADol (ULTRAM) 50 MG tablet Take 1 tablet (50 mg total) by mouth every 6 (six) hours as needed. 01/04/20   Fredia Sorrow, MD    Allergies    Patient has no known allergies.  Review of Systems   Review of Systems  Constitutional: Negative for chills and fever.  HENT: Negative for congestion, rhinorrhea and sore throat.   Eyes: Negative for visual disturbance.  Respiratory: Negative for cough and shortness of breath.   Cardiovascular: Negative for chest pain and leg swelling.  Gastrointestinal: Negative for abdominal pain, diarrhea, nausea and vomiting.  Genitourinary: Negative for dysuria.  Musculoskeletal: Positive for back pain. Negative  for neck pain.  Skin: Negative for rash.  Neurological: Negative for dizziness, light-headedness and headaches.  Hematological: Does not bruise/bleed easily.  Psychiatric/Behavioral: Negative for confusion.    Physical Exam Updated Vital Signs BP (!) 149/80 (BP Location: Right Arm)   Pulse 61   Temp 97.8 F (36.6 C) (Oral)   Resp 16   Ht 1.676 m (5\' 6" )   Wt 61.7 kg   SpO2 96%   BMI 21.95 kg/m   Physical Exam Vitals and nursing note reviewed.  Constitutional:      General: She is not in acute distress.    Appearance: Normal appearance. She is well-developed.  HENT:     Head: Normocephalic and atraumatic.  Eyes:     Extraocular Movements: Extraocular movements intact.      Conjunctiva/sclera: Conjunctivae normal.     Pupils: Pupils are equal, round, and reactive to light.  Cardiovascular:     Rate and Rhythm: Normal rate and regular rhythm.     Heart sounds: No murmur.  Pulmonary:     Effort: Pulmonary effort is normal. No respiratory distress.     Breath sounds: Normal breath sounds.  Abdominal:     Palpations: Abdomen is soft.     Tenderness: There is no abdominal tenderness.  Musculoskeletal:        General: Tenderness present. Normal range of motion.     Cervical back: Normal range of motion and neck supple.     Comments: Some tenderness to palpation right hip area laterally.  Patient able to raise left and right legs without significant difficulty.  Neurovascularly intact distally.  Some right-sided lumbar back pain tenderness as well.  Skin:    General: Skin is warm and dry.     Capillary Refill: Capillary refill takes less than 2 seconds.  Neurological:     General: No focal deficit present.     Mental Status: She is alert and oriented to person, place, and time.     Cranial Nerves: No cranial nerve deficit.     Sensory: No sensory deficit.     Motor: No weakness.     ED Results / Procedures / Treatments   Labs (all labs ordered are listed, but only abnormal results are displayed) Labs Reviewed - No data to display  EKG EKG Interpretation  Date/Time:  Monday January 04 2020 09:45:13 EDT Ventricular Rate:  72 PR Interval:    QRS Duration: 146 QT Interval:  465 QTC Calculation: 509 R Axis:   3 Text Interpretation: Sinus rhythm Right bundle branch block Inferior infarct, age indeterminate Confirmed by Fredia Sorrow [phone]) on 01/04/2020 9:58:08 AM   Radiology DG Lumbar Spine Complete  Result Date: 01/04/2020 CLINICAL DATA:  [place] 3 weeks ago, lower back and BILATERAL hip pain, pain radiating to RIGHT thigh and RIGHT knee EXAM: LUMBAR SPINE - COMPLETE 4+ VIEW COMPARISON:  None FINDINGS: Multiple calcified gallstones within gallbladder.  Diffuse osseous demineralization. Five non-rib-bearing lumbar vertebra with partial lumbarization of S1. Dextroconvex scoliosis. Multilevel disc space narrowing and endplate spur formation. Facet degenerative changes lower lumbar spine. No definite fracture, subluxation, or bone destruction. Atherosclerotic calcifications aorta. BILATERAL hip prostheses with chronic deformity of the RIGHT acetabular cup and surrounding pelvic bone. IMPRESSION: Degenerative disc/facet disease changes of the lumbar spine with osseous demineralization and dextroconvex scoliosis. No acute osseous abnormalities. Cholelithiasis. Aortic Atherosclerosis (ICD10-I70.0). Electronically Signed   By: Lavonia Dana M.D.   On: 01/04/2020 10:55   DG HIP UNILAT WITH PELVIS 2-3 VIEWS LEFT  Result Date: 01/04/2020 CLINICAL DATA:  [place] 3 weeks ago, lower back and BILATERAL hip pain, pain radiating to RIGHT thigh and RIGHT knee EXAM: DG HIP (WITH OR WITHOUT PELVIS) 2-3V LEFT COMPARISON:  06/14/2018 FINDINGS: Prior BILATERAL hip replacements. Deformity of the RIGHT acetabulum with again identified lucency surrounding the acetabular cup unchanged. Osseous demineralization. No acute fracture, dislocation or additional bone destruction. IMPRESSION: LEFT hip prosthesis without acute abnormalities. Chronic RIGHT acetabular deformity post hip replacement surgery with persistent mild surrounding lucency. Electronically Signed   By: Lavonia Dana M.D.   On: 01/04/2020 10:49   DG Femur Min 2 Views Right  Result Date: 01/04/2020 CLINICAL DATA:  [place] 3 weeks ago, lower back and BILATERAL hip pain, pain radiating to RIGHT thigh and RIGHT knee EXAM: RIGHT FEMUR 2 VIEWS COMPARISON:  06/14/2018 FINDINGS: Osseous demineralization. Deformity of the RIGHT acetabular cup again identified with mild surrounding lucency at the inferior greater than superior margins, unchanged. Deformity of the proximal RIGHT femur with chronic periosteal new bone, appears old. No acute  fracture or dislocation. Chronic lucency surrounding the cerclage wire at the proximal RIGHT femoral metadiaphysis unchanged. Knee joint space narrowing noted. IMPRESSION: RIGHT hip prosthesis with chronic deformity of the RIGHT acetabular cup. Mild lucency is identified adjacent to the acetabular cup with associated osseous deformity, little changed from prior study, could be related to postoperative changes or particle disease. Chronic lucency surrounding the cerclage wire at the proximal RIGHT femoral metadiaphysis, question loosening, chronic infection not completely excluded. Electronically Signed   By: Lavonia Dana M.D.   On: 01/04/2020 10:53    Procedures Procedures (including critical care time)  Medications Ordered in ED Medications - No data to display  ED Course  I have reviewed the triage vital signs and the nursing notes.  Pertinent labs & imaging results that were available during my care of the patient were reviewed by me and considered in my medical decision making (see chart for details).    MDM Rules/Calculators/A&P                     X-rays show no acute fracture or dislocation.  Patient has some skin changes that are chronic around her right hip replacement.  We will have her follow-up with her orthopedic surgeon.  We will treat with tramadol in the meantime.  Patient does have a walker at home.  Suspect that the soreness is related to the fall but would have expected a little bit more improvement at the 3-week mark.  Is possible there could be some chronic changes that are causing pain in that right hip.  Final Clinical Impression(s) / ED Diagnoses Final diagnoses:  Fall, initial encounter  Hip pain, acute, right    Rx / DC Orders ED Discharge Orders         Ordered    traMADol (ULTRAM) 50 MG tablet  Every 6 hours PRN     01/04/20 1156           Fredia Sorrow, MD 01/04/20 1200

## 2020-01-07 DIAGNOSIS — E7849 Other hyperlipidemia: Secondary | ICD-10-CM | POA: Diagnosis not present

## 2020-01-07 DIAGNOSIS — I10 Essential (primary) hypertension: Secondary | ICD-10-CM | POA: Diagnosis not present

## 2020-01-08 DIAGNOSIS — E7849 Other hyperlipidemia: Secondary | ICD-10-CM | POA: Diagnosis not present

## 2020-01-08 DIAGNOSIS — I10 Essential (primary) hypertension: Secondary | ICD-10-CM | POA: Diagnosis not present

## 2020-01-15 DIAGNOSIS — S39012A Strain of muscle, fascia and tendon of lower back, initial encounter: Secondary | ICD-10-CM | POA: Diagnosis not present

## 2020-01-15 DIAGNOSIS — M25551 Pain in right hip: Secondary | ICD-10-CM | POA: Diagnosis not present

## 2020-01-15 DIAGNOSIS — M25552 Pain in left hip: Secondary | ICD-10-CM | POA: Diagnosis not present

## 2020-01-15 DIAGNOSIS — Z96643 Presence of artificial hip joint, bilateral: Secondary | ICD-10-CM | POA: Diagnosis not present

## 2020-01-19 DIAGNOSIS — I10 Essential (primary) hypertension: Secondary | ICD-10-CM | POA: Diagnosis not present

## 2020-01-19 DIAGNOSIS — R69 Illness, unspecified: Secondary | ICD-10-CM | POA: Diagnosis not present

## 2020-01-19 DIAGNOSIS — G3184 Mild cognitive impairment, so stated: Secondary | ICD-10-CM | POA: Diagnosis not present

## 2020-01-19 DIAGNOSIS — Z96649 Presence of unspecified artificial hip joint: Secondary | ICD-10-CM | POA: Diagnosis not present

## 2020-01-19 DIAGNOSIS — Z9181 History of falling: Secondary | ICD-10-CM | POA: Diagnosis not present

## 2020-01-19 DIAGNOSIS — Z87891 Personal history of nicotine dependence: Secondary | ICD-10-CM | POA: Diagnosis not present

## 2020-01-19 DIAGNOSIS — J309 Allergic rhinitis, unspecified: Secondary | ICD-10-CM | POA: Diagnosis not present

## 2020-01-19 DIAGNOSIS — Z79899 Other long term (current) drug therapy: Secondary | ICD-10-CM | POA: Diagnosis not present

## 2020-02-08 DIAGNOSIS — E7849 Other hyperlipidemia: Secondary | ICD-10-CM | POA: Diagnosis not present

## 2020-02-08 DIAGNOSIS — N183 Chronic kidney disease, stage 3 unspecified: Secondary | ICD-10-CM | POA: Diagnosis not present

## 2020-02-08 DIAGNOSIS — I129 Hypertensive chronic kidney disease with stage 1 through stage 4 chronic kidney disease, or unspecified chronic kidney disease: Secondary | ICD-10-CM | POA: Diagnosis not present

## 2020-03-09 DIAGNOSIS — I129 Hypertensive chronic kidney disease with stage 1 through stage 4 chronic kidney disease, or unspecified chronic kidney disease: Secondary | ICD-10-CM | POA: Diagnosis not present

## 2020-03-09 DIAGNOSIS — N183 Chronic kidney disease, stage 3 unspecified: Secondary | ICD-10-CM | POA: Diagnosis not present

## 2020-03-09 DIAGNOSIS — E7849 Other hyperlipidemia: Secondary | ICD-10-CM | POA: Diagnosis not present

## 2020-03-21 DIAGNOSIS — J309 Allergic rhinitis, unspecified: Secondary | ICD-10-CM | POA: Diagnosis not present

## 2020-03-21 DIAGNOSIS — R69 Illness, unspecified: Secondary | ICD-10-CM | POA: Diagnosis not present

## 2020-03-21 DIAGNOSIS — N183 Chronic kidney disease, stage 3 unspecified: Secondary | ICD-10-CM | POA: Diagnosis not present

## 2020-03-21 DIAGNOSIS — Z6823 Body mass index (BMI) 23.0-23.9, adult: Secondary | ICD-10-CM | POA: Diagnosis not present

## 2020-03-21 DIAGNOSIS — I10 Essential (primary) hypertension: Secondary | ICD-10-CM | POA: Diagnosis not present

## 2020-03-21 DIAGNOSIS — R443 Hallucinations, unspecified: Secondary | ICD-10-CM | POA: Diagnosis not present

## 2020-04-08 DIAGNOSIS — I129 Hypertensive chronic kidney disease with stage 1 through stage 4 chronic kidney disease, or unspecified chronic kidney disease: Secondary | ICD-10-CM | POA: Diagnosis not present

## 2020-04-08 DIAGNOSIS — E7849 Other hyperlipidemia: Secondary | ICD-10-CM | POA: Diagnosis not present

## 2020-04-08 DIAGNOSIS — N183 Chronic kidney disease, stage 3 unspecified: Secondary | ICD-10-CM | POA: Diagnosis not present

## 2020-05-10 DIAGNOSIS — E7849 Other hyperlipidemia: Secondary | ICD-10-CM | POA: Diagnosis not present

## 2020-05-10 DIAGNOSIS — N183 Chronic kidney disease, stage 3 unspecified: Secondary | ICD-10-CM | POA: Diagnosis not present

## 2020-05-10 DIAGNOSIS — I129 Hypertensive chronic kidney disease with stage 1 through stage 4 chronic kidney disease, or unspecified chronic kidney disease: Secondary | ICD-10-CM | POA: Diagnosis not present

## 2020-05-17 DIAGNOSIS — R443 Hallucinations, unspecified: Secondary | ICD-10-CM | POA: Diagnosis not present

## 2020-05-17 DIAGNOSIS — Z6823 Body mass index (BMI) 23.0-23.9, adult: Secondary | ICD-10-CM | POA: Diagnosis not present

## 2020-05-17 DIAGNOSIS — I10 Essential (primary) hypertension: Secondary | ICD-10-CM | POA: Diagnosis not present

## 2020-05-17 DIAGNOSIS — S6292XA Unspecified fracture of left wrist and hand, initial encounter for closed fracture: Secondary | ICD-10-CM | POA: Diagnosis not present

## 2020-05-17 DIAGNOSIS — R69 Illness, unspecified: Secondary | ICD-10-CM | POA: Diagnosis not present

## 2020-05-17 DIAGNOSIS — R2689 Other abnormalities of gait and mobility: Secondary | ICD-10-CM | POA: Diagnosis not present

## 2020-05-17 DIAGNOSIS — E782 Mixed hyperlipidemia: Secondary | ICD-10-CM | POA: Diagnosis not present

## 2020-05-17 DIAGNOSIS — Z111 Encounter for screening for respiratory tuberculosis: Secondary | ICD-10-CM | POA: Diagnosis not present

## 2020-05-18 DIAGNOSIS — Z23 Encounter for immunization: Secondary | ICD-10-CM | POA: Diagnosis not present

## 2020-05-22 DIAGNOSIS — Z20822 Contact with and (suspected) exposure to covid-19: Secondary | ICD-10-CM | POA: Diagnosis not present

## 2020-05-31 DIAGNOSIS — Z6824 Body mass index (BMI) 24.0-24.9, adult: Secondary | ICD-10-CM | POA: Diagnosis not present

## 2020-05-31 DIAGNOSIS — R609 Edema, unspecified: Secondary | ICD-10-CM | POA: Diagnosis not present

## 2020-06-03 DIAGNOSIS — H6122 Impacted cerumen, left ear: Secondary | ICD-10-CM | POA: Diagnosis not present

## 2020-06-03 DIAGNOSIS — I10 Essential (primary) hypertension: Secondary | ICD-10-CM | POA: Diagnosis not present

## 2020-06-03 DIAGNOSIS — H6091 Unspecified otitis externa, right ear: Secondary | ICD-10-CM | POA: Diagnosis not present

## 2020-06-03 DIAGNOSIS — R69 Illness, unspecified: Secondary | ICD-10-CM | POA: Diagnosis not present

## 2020-06-03 DIAGNOSIS — H9190 Unspecified hearing loss, unspecified ear: Secondary | ICD-10-CM | POA: Diagnosis not present

## 2020-06-06 DIAGNOSIS — R601 Generalized edema: Secondary | ICD-10-CM | POA: Diagnosis not present

## 2020-06-07 DIAGNOSIS — E038 Other specified hypothyroidism: Secondary | ICD-10-CM | POA: Diagnosis not present

## 2020-06-07 DIAGNOSIS — E7849 Other hyperlipidemia: Secondary | ICD-10-CM | POA: Diagnosis not present

## 2020-06-07 DIAGNOSIS — E119 Type 2 diabetes mellitus without complications: Secondary | ICD-10-CM | POA: Diagnosis not present

## 2020-06-07 DIAGNOSIS — D518 Other vitamin B12 deficiency anemias: Secondary | ICD-10-CM | POA: Diagnosis not present

## 2020-06-07 DIAGNOSIS — Z79899 Other long term (current) drug therapy: Secondary | ICD-10-CM | POA: Diagnosis not present

## 2020-06-07 DIAGNOSIS — E559 Vitamin D deficiency, unspecified: Secondary | ICD-10-CM | POA: Diagnosis not present

## 2020-06-08 DIAGNOSIS — R4189 Other symptoms and signs involving cognitive functions and awareness: Secondary | ICD-10-CM | POA: Diagnosis not present

## 2020-06-08 DIAGNOSIS — R600 Localized edema: Secondary | ICD-10-CM | POA: Diagnosis not present

## 2020-06-08 DIAGNOSIS — R69 Illness, unspecified: Secondary | ICD-10-CM | POA: Diagnosis not present

## 2020-06-08 DIAGNOSIS — I82401 Acute embolism and thrombosis of unspecified deep veins of right lower extremity: Secondary | ICD-10-CM | POA: Diagnosis not present

## 2020-06-08 DIAGNOSIS — H6091 Unspecified otitis externa, right ear: Secondary | ICD-10-CM | POA: Diagnosis not present

## 2020-06-08 DIAGNOSIS — I10 Essential (primary) hypertension: Secondary | ICD-10-CM | POA: Diagnosis not present

## 2020-06-09 DIAGNOSIS — E7849 Other hyperlipidemia: Secondary | ICD-10-CM | POA: Diagnosis not present

## 2020-06-09 DIAGNOSIS — N183 Chronic kidney disease, stage 3 unspecified: Secondary | ICD-10-CM | POA: Diagnosis not present

## 2020-06-09 DIAGNOSIS — I129 Hypertensive chronic kidney disease with stage 1 through stage 4 chronic kidney disease, or unspecified chronic kidney disease: Secondary | ICD-10-CM | POA: Diagnosis not present

## 2020-06-14 DIAGNOSIS — R69 Illness, unspecified: Secondary | ICD-10-CM | POA: Diagnosis not present

## 2020-06-15 ENCOUNTER — Emergency Department (HOSPITAL_COMMUNITY): Payer: Medicare HMO

## 2020-06-15 ENCOUNTER — Encounter (HOSPITAL_COMMUNITY): Payer: Self-pay

## 2020-06-15 ENCOUNTER — Other Ambulatory Visit: Payer: Self-pay

## 2020-06-15 ENCOUNTER — Emergency Department (HOSPITAL_COMMUNITY)
Admission: EM | Admit: 2020-06-15 | Discharge: 2020-06-15 | Disposition: A | Discharge status: Home / Self Care | Payer: Medicare HMO | Attending: Emergency Medicine | Admitting: Emergency Medicine

## 2020-06-15 DIAGNOSIS — W19XXXA Unspecified fall, initial encounter: Secondary | ICD-10-CM | POA: Insufficient documentation

## 2020-06-15 DIAGNOSIS — Z8673 Personal history of transient ischemic attack (TIA), and cerebral infarction without residual deficits: Secondary | ICD-10-CM | POA: Insufficient documentation

## 2020-06-15 DIAGNOSIS — M25512 Pain in left shoulder: Secondary | ICD-10-CM | POA: Insufficient documentation

## 2020-06-15 DIAGNOSIS — Z79899 Other long term (current) drug therapy: Secondary | ICD-10-CM | POA: Diagnosis not present

## 2020-06-15 DIAGNOSIS — R410 Disorientation, unspecified: Secondary | ICD-10-CM | POA: Diagnosis not present

## 2020-06-15 DIAGNOSIS — S0240BA Malar fracture, left side, initial encounter for closed fracture: Secondary | ICD-10-CM | POA: Diagnosis not present

## 2020-06-15 DIAGNOSIS — S0292XA Unspecified fracture of facial bones, initial encounter for closed fracture: Secondary | ICD-10-CM

## 2020-06-15 DIAGNOSIS — Z043 Encounter for examination and observation following other accident: Secondary | ICD-10-CM | POA: Diagnosis not present

## 2020-06-15 DIAGNOSIS — M25562 Pain in left knee: Secondary | ICD-10-CM | POA: Diagnosis not present

## 2020-06-15 DIAGNOSIS — H61891 Other specified disorders of right external ear: Secondary | ICD-10-CM | POA: Diagnosis not present

## 2020-06-15 DIAGNOSIS — Y92128 Other place in nursing home as the place of occurrence of the external cause: Secondary | ICD-10-CM | POA: Insufficient documentation

## 2020-06-15 DIAGNOSIS — I10 Essential (primary) hypertension: Secondary | ICD-10-CM | POA: Insufficient documentation

## 2020-06-15 DIAGNOSIS — S0240FA Zygomatic fracture, left side, initial encounter for closed fracture: Secondary | ICD-10-CM | POA: Diagnosis not present

## 2020-06-15 DIAGNOSIS — Z87891 Personal history of nicotine dependence: Secondary | ICD-10-CM | POA: Diagnosis not present

## 2020-06-15 DIAGNOSIS — F0390 Unspecified dementia without behavioral disturbance: Secondary | ICD-10-CM | POA: Insufficient documentation

## 2020-06-15 DIAGNOSIS — R69 Illness, unspecified: Secondary | ICD-10-CM | POA: Diagnosis not present

## 2020-06-15 DIAGNOSIS — R609 Edema, unspecified: Secondary | ICD-10-CM | POA: Diagnosis not present

## 2020-06-15 DIAGNOSIS — H938X1 Other specified disorders of right ear: Secondary | ICD-10-CM

## 2020-06-15 DIAGNOSIS — S0510XA Contusion of eyeball and orbital tissues, unspecified eye, initial encounter: Secondary | ICD-10-CM | POA: Diagnosis not present

## 2020-06-15 DIAGNOSIS — Z041 Encounter for examination and observation following transport accident: Secondary | ICD-10-CM | POA: Diagnosis not present

## 2020-06-15 DIAGNOSIS — S0990XA Unspecified injury of head, initial encounter: Secondary | ICD-10-CM | POA: Diagnosis present

## 2020-06-15 DIAGNOSIS — Z96642 Presence of left artificial hip joint: Secondary | ICD-10-CM | POA: Insufficient documentation

## 2020-06-15 DIAGNOSIS — R220 Localized swelling, mass and lump, head: Secondary | ICD-10-CM | POA: Diagnosis not present

## 2020-06-15 NOTE — ED Notes (Signed)
Continues in CT 

## 2020-06-15 NOTE — ED Provider Notes (Signed)
[facility] EMERGENCY DEPARTMENT Provider Note   CSN: [id number] Arrival date & time: 06/15/20  1705     History Chief Complaint  Patient presents with  . Fall    Christie Nelson is a 86 y.o. female.  Patient brought in by EMS from [place] patient was found on the floor up against the air conditioner unit.  Patient with complaint of left shoulder left knee pain.  Patient does have a history of dementia.  Apparently baseline.  Patient with significant old bruising to the left side of her face from previous fall.  Around the periorbital area.        Past Medical History:  Diagnosis Date  . Arthritis   . GERD (gastroesophageal reflux disease)   . Hypertension   . Prolactinoma ([place])   . Stroke [facility])     Patient Active Problem List   Diagnosis Date Noted  . S/P left hip replacement 09/18/2011    Past Surgical History:  Procedure Laterality Date  . ABDOMINAL HYSTERECTOMY  yrs ago  . CATARACT EXTRACTION W/ INTRAOCULAR LENS  IMPLANT, BILATERAL  2008   both eyes done  . RECTOCELE REPAIR  yrs ago  . right hip arthroplasty  2003  . TONSILLECTOMY  age 86  . TOTAL HIP ARTHROPLASTY  09/18/2011   Procedure: TOTAL HIP ARTHROPLASTY ANTERIOR APPROACH;  Surgeon: Mauri Pole;  Location: [facility];  Service: Orthopedics;  Laterality: Left;     OB History   No obstetric history on file.     No family history on file.  Social History   Tobacco Use  . Smoking status: Former Smoker    Packs/day: 0.25    Years: 30.00    Pack years: 7.50    Quit date: 09/11/1987    Years since quitting: 32.7  . Smokeless tobacco: Never Used  Substance Use Topics  . Alcohol use: No  . Drug use: No    Home Medications Prior to Admission medications   Medication Sig Start Date End Date Taking? Authorizing Provider  famotidine (PEPCID) 40 MG tablet Take 40 mg by mouth every morning. 12/17/19   [provider]  fluticasone (FLONASE) 50 MCG/ACT nasal spray Place 2  sprays into both nostrils daily. 12/17/19   [provider]  hydrochlorothiazide (MICROZIDE) 12.5 MG capsule Take 12.5 mg by mouth every morning. 12/17/19   [provider]  QUEtiapine (SEROQUEL) 25 MG tablet Take 25 mg by mouth at bedtime. 12/17/19   [provider]  sertraline (ZOLOFT) 50 MG tablet Take 1 tablet by mouth daily. 05/29/18   [provider]  traMADol (ULTRAM) 50 MG tablet Take 1 tablet (50 mg total) by mouth every 6 (six) hours as needed. 01/04/20   Fredia Sorrow, MD    Allergies    Patient has no known allergies.  Review of Systems   Review of Systems  Unable to perform ROS: Dementia    Physical Exam Updated Vital Signs BP (!) 145/85 (BP Location: Left Arm)   Pulse 76   Temp 98 F (36.7 C) (Oral)   Resp 16   Ht 1.575 m (5\' 2" )   Wt 62.7 kg   SpO2 96%   BMI 25.28 kg/m   Physical Exam Vitals and nursing note reviewed.  Constitutional:      General: She is not in acute distress.    Appearance: She is well-developed.  HENT:     Head: Normocephalic.     Comments: Patient with hematoma to the  left lateral forehead area.  As well as old bruising around the left periorbital area. Eyes:     Extraocular Movements: Extraocular movements intact.     Conjunctiva/sclera: Conjunctivae normal.     Pupils: Pupils are equal, round, and reactive to light.     Comments: Extraocular muscles are intact.  No evidence of any entrapment.  Cardiovascular:     Rate and Rhythm: Normal rate and regular rhythm.     Heart sounds: No murmur heard.   Pulmonary:     Effort: Pulmonary effort is normal. No respiratory distress.     Breath sounds: Normal breath sounds.  Abdominal:     Palpations: Abdomen is soft.     Tenderness: There is no abdominal tenderness.  Musculoskeletal:        General: No swelling.     Cervical back: Neck supple.  Skin:    General: Skin is warm and dry.  Neurological:     Mental Status: She is alert. Mental status is at  baseline.     Motor: No weakness.     ED Results / Procedures / Treatments   Labs (all labs ordered are listed, but only abnormal results are displayed) Labs Reviewed - No data to display  EKG None  Radiology No results found.  Procedures Procedures (including critical care time)  Medications Ordered in ED Medications - No data to display  ED Course  I have reviewed the triage vital signs and the nursing notes.  Pertinent labs & imaging results that were available during my care of the patient were reviewed by me and considered in my medical decision making (see chart for details).    MDM Rules/Calculators/A&P                          Work-up was significant findings.  However CT head and neck without any acute brain or skull injury.  No cervical spine injury.  Plain x-rays of both hips and pelvis left shoulder left knee without any acute bony abnormalities.  CT maxillofacial though did have several abnormalities on CT scan.  To include a nondisplaced left zygomatic arch fracture appears subacute.  Left anterior and inferior maxillary sinus wall fractures.  And most importantly right external ill canal with a high density mass suggestive of a cholesteatoma or squamous cell carcinoma.  Patient stable for discharge back to nursing facility.  But will require follow-up with ear nose and throat.    Final Clinical Impression(s) / ED Diagnoses Final diagnoses:  None    Rx / DC Orders ED Discharge Orders    None       Fredia Sorrow, MD 06/15/20 2106

## 2020-06-15 NOTE — ED Notes (Signed)
Family here  They will transport pt to [facility]  EMS cancelled

## 2020-06-15 NOTE — ED Triage Notes (Signed)
Pt brought to ED via [facility] for fall. Pt from [facility] and was found in the floor up against the air conditioner unit. Pt c/o left shoulder and left knee pain. Pt with hx of dementia. Pt with significant old bruising to left side of face from previous fall.

## 2020-06-15 NOTE — ED Notes (Signed)
Called report to [facility]  [phone]

## 2020-06-15 NOTE — Discharge Instructions (Addendum)
Work-up here in the emergency department CT head without any skull or brain injury.  CT cervical spine without any acute injury.  However CT maxillofacial showed several facial fractures on the left side.  Some of them may be subacute.  But will require follow-up with ear nose and throat.  In addition there is a mass in the right external ear canal.  That could be a cholesteatoma or squamous cell carcinoma.  Patient will require follow-up with ear nose and throat for that.  X-ray of both hips and pelvis as well as left shoulder and left knee without any acute findings

## 2020-06-15 NOTE — ED Notes (Signed)
       TCT                                   

## 2020-06-16 DIAGNOSIS — R69 Illness, unspecified: Secondary | ICD-10-CM | POA: Diagnosis not present

## 2020-06-16 DIAGNOSIS — R296 Repeated falls: Secondary | ICD-10-CM | POA: Diagnosis not present

## 2020-06-16 DIAGNOSIS — R443 Hallucinations, unspecified: Secondary | ICD-10-CM | POA: Diagnosis not present

## 2020-06-16 DIAGNOSIS — F419 Anxiety disorder, unspecified: Secondary | ICD-10-CM | POA: Diagnosis not present

## 2020-06-17 DIAGNOSIS — R296 Repeated falls: Secondary | ICD-10-CM | POA: Diagnosis not present

## 2020-06-20 DIAGNOSIS — E119 Type 2 diabetes mellitus without complications: Secondary | ICD-10-CM | POA: Diagnosis not present

## 2020-06-20 DIAGNOSIS — E7849 Other hyperlipidemia: Secondary | ICD-10-CM | POA: Diagnosis not present

## 2020-06-20 DIAGNOSIS — Z79899 Other long term (current) drug therapy: Secondary | ICD-10-CM | POA: Diagnosis not present

## 2020-06-20 DIAGNOSIS — D518 Other vitamin B12 deficiency anemias: Secondary | ICD-10-CM | POA: Diagnosis not present

## 2020-06-28 DIAGNOSIS — I10 Essential (primary) hypertension: Secondary | ICD-10-CM | POA: Diagnosis not present

## 2020-06-28 DIAGNOSIS — R531 Weakness: Secondary | ICD-10-CM | POA: Diagnosis not present

## 2020-06-28 DIAGNOSIS — E785 Hyperlipidemia, unspecified: Secondary | ICD-10-CM | POA: Diagnosis not present

## 2020-06-28 DIAGNOSIS — R296 Repeated falls: Secondary | ICD-10-CM | POA: Diagnosis not present

## 2020-06-29 DIAGNOSIS — R296 Repeated falls: Secondary | ICD-10-CM | POA: Diagnosis not present

## 2020-06-29 DIAGNOSIS — R269 Unspecified abnormalities of gait and mobility: Secondary | ICD-10-CM | POA: Diagnosis not present

## 2020-06-29 DIAGNOSIS — M6281 Muscle weakness (generalized): Secondary | ICD-10-CM | POA: Diagnosis not present

## 2020-07-01 DIAGNOSIS — I739 Peripheral vascular disease, unspecified: Secondary | ICD-10-CM | POA: Diagnosis not present

## 2020-07-01 DIAGNOSIS — M2042 Other hammer toe(s) (acquired), left foot: Secondary | ICD-10-CM | POA: Diagnosis not present

## 2020-07-01 DIAGNOSIS — R269 Unspecified abnormalities of gait and mobility: Secondary | ICD-10-CM | POA: Diagnosis not present

## 2020-07-01 DIAGNOSIS — R296 Repeated falls: Secondary | ICD-10-CM | POA: Diagnosis not present

## 2020-07-01 DIAGNOSIS — B351 Tinea unguium: Secondary | ICD-10-CM | POA: Diagnosis not present

## 2020-07-01 DIAGNOSIS — M6281 Muscle weakness (generalized): Secondary | ICD-10-CM | POA: Diagnosis not present

## 2020-07-01 DIAGNOSIS — M79675 Pain in left toe(s): Secondary | ICD-10-CM | POA: Diagnosis not present

## 2020-07-08 DIAGNOSIS — R269 Unspecified abnormalities of gait and mobility: Secondary | ICD-10-CM | POA: Diagnosis not present

## 2020-07-08 DIAGNOSIS — M6281 Muscle weakness (generalized): Secondary | ICD-10-CM | POA: Diagnosis not present

## 2020-07-08 DIAGNOSIS — R296 Repeated falls: Secondary | ICD-10-CM | POA: Diagnosis not present

## 2020-07-12 DIAGNOSIS — M6281 Muscle weakness (generalized): Secondary | ICD-10-CM | POA: Diagnosis not present

## 2020-07-12 DIAGNOSIS — H938X2 Other specified disorders of left ear: Secondary | ICD-10-CM | POA: Diagnosis not present

## 2020-07-12 DIAGNOSIS — I10 Essential (primary) hypertension: Secondary | ICD-10-CM | POA: Diagnosis not present

## 2020-07-12 DIAGNOSIS — S0083XD Contusion of other part of head, subsequent encounter: Secondary | ICD-10-CM | POA: Diagnosis not present

## 2020-07-12 DIAGNOSIS — H60331 Swimmer's ear, right ear: Secondary | ICD-10-CM | POA: Diagnosis not present

## 2020-07-12 DIAGNOSIS — R296 Repeated falls: Secondary | ICD-10-CM | POA: Diagnosis not present

## 2020-07-12 DIAGNOSIS — R269 Unspecified abnormalities of gait and mobility: Secondary | ICD-10-CM | POA: Diagnosis not present

## 2020-07-14 DIAGNOSIS — M6281 Muscle weakness (generalized): Secondary | ICD-10-CM | POA: Diagnosis not present

## 2020-07-14 DIAGNOSIS — R269 Unspecified abnormalities of gait and mobility: Secondary | ICD-10-CM | POA: Diagnosis not present

## 2020-07-14 DIAGNOSIS — I10 Essential (primary) hypertension: Secondary | ICD-10-CM | POA: Diagnosis not present

## 2020-07-14 DIAGNOSIS — R296 Repeated falls: Secondary | ICD-10-CM | POA: Diagnosis not present

## 2020-07-20 DIAGNOSIS — F419 Anxiety disorder, unspecified: Secondary | ICD-10-CM | POA: Diagnosis not present

## 2020-07-20 DIAGNOSIS — R69 Illness, unspecified: Secondary | ICD-10-CM | POA: Diagnosis not present

## 2020-07-20 DIAGNOSIS — R296 Repeated falls: Secondary | ICD-10-CM | POA: Diagnosis not present

## 2020-07-20 DIAGNOSIS — R443 Hallucinations, unspecified: Secondary | ICD-10-CM | POA: Diagnosis not present

## 2020-07-26 DIAGNOSIS — R69 Illness, unspecified: Secondary | ICD-10-CM | POA: Diagnosis not present

## 2020-07-26 DIAGNOSIS — E785 Hyperlipidemia, unspecified: Secondary | ICD-10-CM | POA: Diagnosis not present

## 2020-07-26 DIAGNOSIS — R296 Repeated falls: Secondary | ICD-10-CM | POA: Diagnosis not present

## 2020-07-26 DIAGNOSIS — R4189 Other symptoms and signs involving cognitive functions and awareness: Secondary | ICD-10-CM | POA: Diagnosis not present

## 2020-07-28 DIAGNOSIS — Z79899 Other long term (current) drug therapy: Secondary | ICD-10-CM | POA: Diagnosis not present

## 2020-07-28 DIAGNOSIS — D518 Other vitamin B12 deficiency anemias: Secondary | ICD-10-CM | POA: Diagnosis not present

## 2020-07-28 DIAGNOSIS — E7849 Other hyperlipidemia: Secondary | ICD-10-CM | POA: Diagnosis not present

## 2020-07-28 DIAGNOSIS — E119 Type 2 diabetes mellitus without complications: Secondary | ICD-10-CM | POA: Diagnosis not present

## 2020-08-02 DIAGNOSIS — R4189 Other symptoms and signs involving cognitive functions and awareness: Secondary | ICD-10-CM | POA: Diagnosis not present

## 2020-08-03 DIAGNOSIS — R4182 Altered mental status, unspecified: Secondary | ICD-10-CM | POA: Diagnosis not present

## 2020-08-11 DIAGNOSIS — Z79899 Other long term (current) drug therapy: Secondary | ICD-10-CM | POA: Diagnosis not present

## 2020-08-11 DIAGNOSIS — E7849 Other hyperlipidemia: Secondary | ICD-10-CM | POA: Diagnosis not present

## 2020-08-11 DIAGNOSIS — D518 Other vitamin B12 deficiency anemias: Secondary | ICD-10-CM | POA: Diagnosis not present

## 2020-08-11 DIAGNOSIS — E119 Type 2 diabetes mellitus without complications: Secondary | ICD-10-CM | POA: Diagnosis not present

## 2020-08-23 DIAGNOSIS — H9211 Otorrhea, right ear: Secondary | ICD-10-CM | POA: Diagnosis not present

## 2020-08-24 DIAGNOSIS — E119 Type 2 diabetes mellitus without complications: Secondary | ICD-10-CM | POA: Diagnosis not present

## 2020-08-24 DIAGNOSIS — E7849 Other hyperlipidemia: Secondary | ICD-10-CM | POA: Diagnosis not present

## 2020-08-24 DIAGNOSIS — D518 Other vitamin B12 deficiency anemias: Secondary | ICD-10-CM | POA: Diagnosis not present

## 2020-08-24 DIAGNOSIS — Z79899 Other long term (current) drug therapy: Secondary | ICD-10-CM | POA: Diagnosis not present

## 2020-08-24 DIAGNOSIS — E559 Vitamin D deficiency, unspecified: Secondary | ICD-10-CM | POA: Diagnosis not present

## 2020-08-24 DIAGNOSIS — E038 Other specified hypothyroidism: Secondary | ICD-10-CM | POA: Diagnosis not present

## 2020-09-08 DIAGNOSIS — F5101 Primary insomnia: Secondary | ICD-10-CM | POA: Diagnosis not present

## 2020-09-08 DIAGNOSIS — R69 Illness, unspecified: Secondary | ICD-10-CM | POA: Diagnosis not present

## 2020-09-08 DIAGNOSIS — F419 Anxiety disorder, unspecified: Secondary | ICD-10-CM | POA: Diagnosis not present

## 2020-09-09 DIAGNOSIS — N183 Chronic kidney disease, stage 3 unspecified: Secondary | ICD-10-CM | POA: Diagnosis not present

## 2020-09-09 DIAGNOSIS — I129 Hypertensive chronic kidney disease with stage 1 through stage 4 chronic kidney disease, or unspecified chronic kidney disease: Secondary | ICD-10-CM | POA: Diagnosis not present

## 2020-09-09 DIAGNOSIS — E7849 Other hyperlipidemia: Secondary | ICD-10-CM | POA: Diagnosis not present

## 2020-09-13 DIAGNOSIS — E785 Hyperlipidemia, unspecified: Secondary | ICD-10-CM | POA: Diagnosis not present

## 2020-09-13 DIAGNOSIS — R296 Repeated falls: Secondary | ICD-10-CM | POA: Diagnosis not present

## 2020-09-13 DIAGNOSIS — I10 Essential (primary) hypertension: Secondary | ICD-10-CM | POA: Diagnosis not present

## 2020-09-13 DIAGNOSIS — R69 Illness, unspecified: Secondary | ICD-10-CM | POA: Diagnosis not present

## 2020-09-13 DIAGNOSIS — R4189 Other symptoms and signs involving cognitive functions and awareness: Secondary | ICD-10-CM | POA: Diagnosis not present

## 2020-09-25 DIAGNOSIS — I10 Essential (primary) hypertension: Secondary | ICD-10-CM | POA: Diagnosis not present

## 2020-09-25 DIAGNOSIS — E785 Hyperlipidemia, unspecified: Secondary | ICD-10-CM | POA: Diagnosis not present

## 2020-09-25 DIAGNOSIS — E038 Other specified hypothyroidism: Secondary | ICD-10-CM | POA: Diagnosis not present

## 2020-09-25 DIAGNOSIS — E119 Type 2 diabetes mellitus without complications: Secondary | ICD-10-CM | POA: Diagnosis not present

## 2020-09-25 DIAGNOSIS — E559 Vitamin D deficiency, unspecified: Secondary | ICD-10-CM | POA: Diagnosis not present

## 2020-09-25 DIAGNOSIS — D518 Other vitamin B12 deficiency anemias: Secondary | ICD-10-CM | POA: Diagnosis not present

## 2020-10-10 DIAGNOSIS — F419 Anxiety disorder, unspecified: Secondary | ICD-10-CM | POA: Diagnosis not present

## 2020-10-10 DIAGNOSIS — R69 Illness, unspecified: Secondary | ICD-10-CM | POA: Diagnosis not present

## 2020-10-10 DIAGNOSIS — F5101 Primary insomnia: Secondary | ICD-10-CM | POA: Diagnosis not present

## 2020-10-11 DIAGNOSIS — R69 Illness, unspecified: Secondary | ICD-10-CM | POA: Diagnosis not present

## 2020-10-11 DIAGNOSIS — R197 Diarrhea, unspecified: Secondary | ICD-10-CM | POA: Diagnosis not present

## 2020-10-11 DIAGNOSIS — R4189 Other symptoms and signs involving cognitive functions and awareness: Secondary | ICD-10-CM | POA: Diagnosis not present

## 2020-10-11 DIAGNOSIS — E785 Hyperlipidemia, unspecified: Secondary | ICD-10-CM | POA: Diagnosis not present

## 2020-10-11 DIAGNOSIS — I10 Essential (primary) hypertension: Secondary | ICD-10-CM | POA: Diagnosis not present

## 2020-10-28 DIAGNOSIS — I10 Essential (primary) hypertension: Secondary | ICD-10-CM | POA: Diagnosis not present

## 2020-10-28 DIAGNOSIS — E038 Other specified hypothyroidism: Secondary | ICD-10-CM | POA: Diagnosis not present

## 2020-10-28 DIAGNOSIS — E559 Vitamin D deficiency, unspecified: Secondary | ICD-10-CM | POA: Diagnosis not present

## 2020-10-28 DIAGNOSIS — D518 Other vitamin B12 deficiency anemias: Secondary | ICD-10-CM | POA: Diagnosis not present

## 2020-10-28 DIAGNOSIS — E785 Hyperlipidemia, unspecified: Secondary | ICD-10-CM | POA: Diagnosis not present

## 2020-10-28 DIAGNOSIS — E119 Type 2 diabetes mellitus without complications: Secondary | ICD-10-CM | POA: Diagnosis not present

## 2020-11-03 DIAGNOSIS — R69 Illness, unspecified: Secondary | ICD-10-CM | POA: Diagnosis not present

## 2020-11-03 DIAGNOSIS — F419 Anxiety disorder, unspecified: Secondary | ICD-10-CM | POA: Diagnosis not present

## 2020-11-03 DIAGNOSIS — F5101 Primary insomnia: Secondary | ICD-10-CM | POA: Diagnosis not present

## 2020-11-07 DIAGNOSIS — N183 Chronic kidney disease, stage 3 unspecified: Secondary | ICD-10-CM | POA: Diagnosis not present

## 2020-11-07 DIAGNOSIS — E7849 Other hyperlipidemia: Secondary | ICD-10-CM | POA: Diagnosis not present

## 2020-11-07 DIAGNOSIS — I129 Hypertensive chronic kidney disease with stage 1 through stage 4 chronic kidney disease, or unspecified chronic kidney disease: Secondary | ICD-10-CM | POA: Diagnosis not present

## 2020-11-11 DIAGNOSIS — R69 Illness, unspecified: Secondary | ICD-10-CM | POA: Diagnosis not present

## 2020-11-12 DIAGNOSIS — R69 Illness, unspecified: Secondary | ICD-10-CM | POA: Diagnosis not present

## 2020-11-13 DIAGNOSIS — R69 Illness, unspecified: Secondary | ICD-10-CM | POA: Diagnosis not present

## 2020-11-14 DIAGNOSIS — R69 Illness, unspecified: Secondary | ICD-10-CM | POA: Diagnosis not present

## 2020-11-15 DIAGNOSIS — R69 Illness, unspecified: Secondary | ICD-10-CM | POA: Diagnosis not present

## 2020-11-16 DIAGNOSIS — R69 Illness, unspecified: Secondary | ICD-10-CM | POA: Diagnosis not present

## 2020-11-17 DIAGNOSIS — R69 Illness, unspecified: Secondary | ICD-10-CM | POA: Diagnosis not present

## 2020-11-18 DIAGNOSIS — I739 Peripheral vascular disease, unspecified: Secondary | ICD-10-CM | POA: Diagnosis not present

## 2020-11-18 DIAGNOSIS — M79675 Pain in left toe(s): Secondary | ICD-10-CM | POA: Diagnosis not present

## 2020-11-18 DIAGNOSIS — B351 Tinea unguium: Secondary | ICD-10-CM | POA: Diagnosis not present

## 2020-11-18 DIAGNOSIS — R69 Illness, unspecified: Secondary | ICD-10-CM | POA: Diagnosis not present

## 2020-11-18 DIAGNOSIS — U071 COVID-19: Secondary | ICD-10-CM | POA: Diagnosis not present

## 2020-11-19 DIAGNOSIS — R69 Illness, unspecified: Secondary | ICD-10-CM | POA: Diagnosis not present

## 2020-11-20 DIAGNOSIS — R69 Illness, unspecified: Secondary | ICD-10-CM | POA: Diagnosis not present

## 2020-11-21 DIAGNOSIS — R69 Illness, unspecified: Secondary | ICD-10-CM | POA: Diagnosis not present

## 2020-11-22 DIAGNOSIS — R69 Illness, unspecified: Secondary | ICD-10-CM | POA: Diagnosis not present

## 2020-11-23 DIAGNOSIS — I10 Essential (primary) hypertension: Secondary | ICD-10-CM | POA: Diagnosis not present

## 2020-11-23 DIAGNOSIS — H9190 Unspecified hearing loss, unspecified ear: Secondary | ICD-10-CM | POA: Diagnosis not present

## 2020-11-23 DIAGNOSIS — R4189 Other symptoms and signs involving cognitive functions and awareness: Secondary | ICD-10-CM | POA: Diagnosis not present

## 2020-11-23 DIAGNOSIS — E785 Hyperlipidemia, unspecified: Secondary | ICD-10-CM | POA: Diagnosis not present

## 2020-11-23 DIAGNOSIS — R69 Illness, unspecified: Secondary | ICD-10-CM | POA: Diagnosis not present

## 2020-11-23 DIAGNOSIS — I739 Peripheral vascular disease, unspecified: Secondary | ICD-10-CM | POA: Diagnosis not present

## 2020-11-23 DIAGNOSIS — N814 Uterovaginal prolapse, unspecified: Secondary | ICD-10-CM | POA: Diagnosis not present

## 2020-11-24 DIAGNOSIS — R69 Illness, unspecified: Secondary | ICD-10-CM | POA: Diagnosis not present

## 2020-11-25 DIAGNOSIS — R69 Illness, unspecified: Secondary | ICD-10-CM | POA: Diagnosis not present

## 2020-11-26 DIAGNOSIS — R69 Illness, unspecified: Secondary | ICD-10-CM | POA: Diagnosis not present

## 2020-11-27 DIAGNOSIS — R69 Illness, unspecified: Secondary | ICD-10-CM | POA: Diagnosis not present

## 2020-11-28 DIAGNOSIS — R69 Illness, unspecified: Secondary | ICD-10-CM | POA: Diagnosis not present

## 2020-11-29 DIAGNOSIS — R69 Illness, unspecified: Secondary | ICD-10-CM | POA: Diagnosis not present

## 2020-11-30 DIAGNOSIS — H9190 Unspecified hearing loss, unspecified ear: Secondary | ICD-10-CM | POA: Diagnosis not present

## 2020-11-30 DIAGNOSIS — R52 Pain, unspecified: Secondary | ICD-10-CM | POA: Diagnosis not present

## 2020-11-30 DIAGNOSIS — R69 Illness, unspecified: Secondary | ICD-10-CM | POA: Diagnosis not present

## 2020-11-30 DIAGNOSIS — R058 Other specified cough: Secondary | ICD-10-CM | POA: Diagnosis not present

## 2020-11-30 DIAGNOSIS — R4189 Other symptoms and signs involving cognitive functions and awareness: Secondary | ICD-10-CM | POA: Diagnosis not present

## 2020-11-30 DIAGNOSIS — R059 Cough, unspecified: Secondary | ICD-10-CM | POA: Diagnosis not present

## 2020-12-01 DIAGNOSIS — R69 Illness, unspecified: Secondary | ICD-10-CM | POA: Diagnosis not present

## 2020-12-01 DIAGNOSIS — D518 Other vitamin B12 deficiency anemias: Secondary | ICD-10-CM | POA: Diagnosis not present

## 2020-12-01 DIAGNOSIS — I10 Essential (primary) hypertension: Secondary | ICD-10-CM | POA: Diagnosis not present

## 2020-12-01 DIAGNOSIS — E785 Hyperlipidemia, unspecified: Secondary | ICD-10-CM | POA: Diagnosis not present

## 2020-12-01 DIAGNOSIS — E559 Vitamin D deficiency, unspecified: Secondary | ICD-10-CM | POA: Diagnosis not present

## 2020-12-01 DIAGNOSIS — E038 Other specified hypothyroidism: Secondary | ICD-10-CM | POA: Diagnosis not present

## 2020-12-01 DIAGNOSIS — E119 Type 2 diabetes mellitus without complications: Secondary | ICD-10-CM | POA: Diagnosis not present

## 2020-12-02 DIAGNOSIS — R69 Illness, unspecified: Secondary | ICD-10-CM | POA: Diagnosis not present

## 2020-12-03 DIAGNOSIS — R69 Illness, unspecified: Secondary | ICD-10-CM | POA: Diagnosis not present

## 2020-12-04 DIAGNOSIS — R69 Illness, unspecified: Secondary | ICD-10-CM | POA: Diagnosis not present

## 2020-12-05 DIAGNOSIS — N8110 Cystocele, unspecified: Secondary | ICD-10-CM | POA: Diagnosis not present

## 2020-12-05 DIAGNOSIS — R69 Illness, unspecified: Secondary | ICD-10-CM | POA: Diagnosis not present

## 2020-12-06 DIAGNOSIS — R69 Illness, unspecified: Secondary | ICD-10-CM | POA: Diagnosis not present

## 2020-12-07 DIAGNOSIS — R69 Illness, unspecified: Secondary | ICD-10-CM | POA: Diagnosis not present

## 2020-12-08 DIAGNOSIS — R69 Illness, unspecified: Secondary | ICD-10-CM | POA: Diagnosis not present

## 2020-12-09 DIAGNOSIS — R69 Illness, unspecified: Secondary | ICD-10-CM | POA: Diagnosis not present

## 2020-12-10 DIAGNOSIS — R69 Illness, unspecified: Secondary | ICD-10-CM | POA: Diagnosis not present

## 2020-12-11 DIAGNOSIS — R69 Illness, unspecified: Secondary | ICD-10-CM | POA: Diagnosis not present

## 2020-12-12 DIAGNOSIS — R69 Illness, unspecified: Secondary | ICD-10-CM | POA: Diagnosis not present

## 2020-12-13 DIAGNOSIS — R69 Illness, unspecified: Secondary | ICD-10-CM | POA: Diagnosis not present

## 2020-12-14 DIAGNOSIS — R69 Illness, unspecified: Secondary | ICD-10-CM | POA: Diagnosis not present

## 2020-12-15 DIAGNOSIS — R69 Illness, unspecified: Secondary | ICD-10-CM | POA: Diagnosis not present

## 2020-12-16 DIAGNOSIS — R69 Illness, unspecified: Secondary | ICD-10-CM | POA: Diagnosis not present

## 2020-12-17 DIAGNOSIS — R69 Illness, unspecified: Secondary | ICD-10-CM | POA: Diagnosis not present

## 2020-12-18 DIAGNOSIS — R69 Illness, unspecified: Secondary | ICD-10-CM | POA: Diagnosis not present

## 2020-12-19 DIAGNOSIS — R69 Illness, unspecified: Secondary | ICD-10-CM | POA: Diagnosis not present

## 2020-12-20 DIAGNOSIS — R69 Illness, unspecified: Secondary | ICD-10-CM | POA: Diagnosis not present

## 2020-12-21 DIAGNOSIS — R4189 Other symptoms and signs involving cognitive functions and awareness: Secondary | ICD-10-CM | POA: Diagnosis not present

## 2020-12-21 DIAGNOSIS — I739 Peripheral vascular disease, unspecified: Secondary | ICD-10-CM | POA: Diagnosis not present

## 2020-12-21 DIAGNOSIS — I10 Essential (primary) hypertension: Secondary | ICD-10-CM | POA: Diagnosis not present

## 2020-12-21 DIAGNOSIS — H9190 Unspecified hearing loss, unspecified ear: Secondary | ICD-10-CM | POA: Diagnosis not present

## 2020-12-21 DIAGNOSIS — E785 Hyperlipidemia, unspecified: Secondary | ICD-10-CM | POA: Diagnosis not present

## 2020-12-21 DIAGNOSIS — R69 Illness, unspecified: Secondary | ICD-10-CM | POA: Diagnosis not present

## 2020-12-22 DIAGNOSIS — R69 Illness, unspecified: Secondary | ICD-10-CM | POA: Diagnosis not present

## 2020-12-23 DIAGNOSIS — R69 Illness, unspecified: Secondary | ICD-10-CM | POA: Diagnosis not present

## 2020-12-24 DIAGNOSIS — R69 Illness, unspecified: Secondary | ICD-10-CM | POA: Diagnosis not present

## 2020-12-25 DIAGNOSIS — R69 Illness, unspecified: Secondary | ICD-10-CM | POA: Diagnosis not present

## 2020-12-26 DIAGNOSIS — R69 Illness, unspecified: Secondary | ICD-10-CM | POA: Diagnosis not present

## 2020-12-27 DIAGNOSIS — R69 Illness, unspecified: Secondary | ICD-10-CM | POA: Diagnosis not present

## 2020-12-28 DIAGNOSIS — R69 Illness, unspecified: Secondary | ICD-10-CM | POA: Diagnosis not present

## 2020-12-29 DIAGNOSIS — R69 Illness, unspecified: Secondary | ICD-10-CM | POA: Diagnosis not present

## 2020-12-30 DIAGNOSIS — R69 Illness, unspecified: Secondary | ICD-10-CM | POA: Diagnosis not present

## 2020-12-31 DIAGNOSIS — R69 Illness, unspecified: Secondary | ICD-10-CM | POA: Diagnosis not present

## 2021-01-01 DIAGNOSIS — R69 Illness, unspecified: Secondary | ICD-10-CM | POA: Diagnosis not present

## 2021-01-02 DIAGNOSIS — R69 Illness, unspecified: Secondary | ICD-10-CM | POA: Diagnosis not present

## 2021-01-03 DIAGNOSIS — D518 Other vitamin B12 deficiency anemias: Secondary | ICD-10-CM | POA: Diagnosis not present

## 2021-01-03 DIAGNOSIS — E119 Type 2 diabetes mellitus without complications: Secondary | ICD-10-CM | POA: Diagnosis not present

## 2021-01-03 DIAGNOSIS — I10 Essential (primary) hypertension: Secondary | ICD-10-CM | POA: Diagnosis not present

## 2021-01-03 DIAGNOSIS — E785 Hyperlipidemia, unspecified: Secondary | ICD-10-CM | POA: Diagnosis not present

## 2021-01-03 DIAGNOSIS — E559 Vitamin D deficiency, unspecified: Secondary | ICD-10-CM | POA: Diagnosis not present

## 2021-01-03 DIAGNOSIS — E038 Other specified hypothyroidism: Secondary | ICD-10-CM | POA: Diagnosis not present

## 2021-01-03 DIAGNOSIS — R69 Illness, unspecified: Secondary | ICD-10-CM | POA: Diagnosis not present

## 2021-01-04 DIAGNOSIS — R69 Illness, unspecified: Secondary | ICD-10-CM | POA: Diagnosis not present

## 2021-01-05 DIAGNOSIS — R69 Illness, unspecified: Secondary | ICD-10-CM | POA: Diagnosis not present

## 2021-01-05 DIAGNOSIS — F5101 Primary insomnia: Secondary | ICD-10-CM | POA: Diagnosis not present

## 2021-01-05 DIAGNOSIS — F419 Anxiety disorder, unspecified: Secondary | ICD-10-CM | POA: Diagnosis not present

## 2021-01-06 DIAGNOSIS — R69 Illness, unspecified: Secondary | ICD-10-CM | POA: Diagnosis not present

## 2021-01-07 DIAGNOSIS — R69 Illness, unspecified: Secondary | ICD-10-CM | POA: Diagnosis not present

## 2021-01-08 DIAGNOSIS — R69 Illness, unspecified: Secondary | ICD-10-CM | POA: Diagnosis not present

## 2021-01-09 DIAGNOSIS — R69 Illness, unspecified: Secondary | ICD-10-CM | POA: Diagnosis not present

## 2021-01-10 DIAGNOSIS — R69 Illness, unspecified: Secondary | ICD-10-CM | POA: Diagnosis not present

## 2021-01-11 DIAGNOSIS — R69 Illness, unspecified: Secondary | ICD-10-CM | POA: Diagnosis not present

## 2021-01-12 DIAGNOSIS — R69 Illness, unspecified: Secondary | ICD-10-CM | POA: Diagnosis not present

## 2021-01-13 DIAGNOSIS — R69 Illness, unspecified: Secondary | ICD-10-CM | POA: Diagnosis not present

## 2021-01-14 DIAGNOSIS — R69 Illness, unspecified: Secondary | ICD-10-CM | POA: Diagnosis not present

## 2021-01-15 DIAGNOSIS — R69 Illness, unspecified: Secondary | ICD-10-CM | POA: Diagnosis not present

## 2021-01-16 DIAGNOSIS — R69 Illness, unspecified: Secondary | ICD-10-CM | POA: Diagnosis not present

## 2021-01-17 DIAGNOSIS — R69 Illness, unspecified: Secondary | ICD-10-CM | POA: Diagnosis not present

## 2021-01-18 DIAGNOSIS — I10 Essential (primary) hypertension: Secondary | ICD-10-CM | POA: Diagnosis not present

## 2021-01-18 DIAGNOSIS — R4189 Other symptoms and signs involving cognitive functions and awareness: Secondary | ICD-10-CM | POA: Diagnosis not present

## 2021-01-18 DIAGNOSIS — R69 Illness, unspecified: Secondary | ICD-10-CM | POA: Diagnosis not present

## 2021-01-18 DIAGNOSIS — E785 Hyperlipidemia, unspecified: Secondary | ICD-10-CM | POA: Diagnosis not present

## 2021-01-18 DIAGNOSIS — I739 Peripheral vascular disease, unspecified: Secondary | ICD-10-CM | POA: Diagnosis not present

## 2021-01-18 DIAGNOSIS — H9190 Unspecified hearing loss, unspecified ear: Secondary | ICD-10-CM | POA: Diagnosis not present

## 2021-01-19 DIAGNOSIS — R69 Illness, unspecified: Secondary | ICD-10-CM | POA: Diagnosis not present

## 2021-01-20 DIAGNOSIS — R69 Illness, unspecified: Secondary | ICD-10-CM | POA: Diagnosis not present

## 2021-01-21 DIAGNOSIS — R69 Illness, unspecified: Secondary | ICD-10-CM | POA: Diagnosis not present

## 2021-01-22 DIAGNOSIS — R69 Illness, unspecified: Secondary | ICD-10-CM | POA: Diagnosis not present

## 2021-01-23 DIAGNOSIS — R69 Illness, unspecified: Secondary | ICD-10-CM | POA: Diagnosis not present

## 2021-01-24 DIAGNOSIS — R69 Illness, unspecified: Secondary | ICD-10-CM | POA: Diagnosis not present

## 2021-01-25 DIAGNOSIS — R69 Illness, unspecified: Secondary | ICD-10-CM | POA: Diagnosis not present

## 2021-01-26 DIAGNOSIS — R69 Illness, unspecified: Secondary | ICD-10-CM | POA: Diagnosis not present

## 2021-01-26 DIAGNOSIS — D518 Other vitamin B12 deficiency anemias: Secondary | ICD-10-CM | POA: Diagnosis not present

## 2021-01-26 DIAGNOSIS — E559 Vitamin D deficiency, unspecified: Secondary | ICD-10-CM | POA: Diagnosis not present

## 2021-01-26 DIAGNOSIS — E038 Other specified hypothyroidism: Secondary | ICD-10-CM | POA: Diagnosis not present

## 2021-01-26 DIAGNOSIS — E119 Type 2 diabetes mellitus without complications: Secondary | ICD-10-CM | POA: Diagnosis not present

## 2021-01-26 DIAGNOSIS — I10 Essential (primary) hypertension: Secondary | ICD-10-CM | POA: Diagnosis not present

## 2021-01-26 DIAGNOSIS — E785 Hyperlipidemia, unspecified: Secondary | ICD-10-CM | POA: Diagnosis not present

## 2021-01-27 DIAGNOSIS — R69 Illness, unspecified: Secondary | ICD-10-CM | POA: Diagnosis not present

## 2021-01-28 DIAGNOSIS — R69 Illness, unspecified: Secondary | ICD-10-CM | POA: Diagnosis not present

## 2021-01-29 DIAGNOSIS — R69 Illness, unspecified: Secondary | ICD-10-CM | POA: Diagnosis not present

## 2021-01-30 DIAGNOSIS — R69 Illness, unspecified: Secondary | ICD-10-CM | POA: Diagnosis not present

## 2021-01-31 DIAGNOSIS — R69 Illness, unspecified: Secondary | ICD-10-CM | POA: Diagnosis not present

## 2021-02-01 DIAGNOSIS — R69 Illness, unspecified: Secondary | ICD-10-CM | POA: Diagnosis not present

## 2021-02-02 DIAGNOSIS — F5101 Primary insomnia: Secondary | ICD-10-CM | POA: Diagnosis not present

## 2021-02-02 DIAGNOSIS — F419 Anxiety disorder, unspecified: Secondary | ICD-10-CM | POA: Diagnosis not present

## 2021-02-02 DIAGNOSIS — R69 Illness, unspecified: Secondary | ICD-10-CM | POA: Diagnosis not present

## 2021-02-03 DIAGNOSIS — R69 Illness, unspecified: Secondary | ICD-10-CM | POA: Diagnosis not present

## 2021-02-04 DIAGNOSIS — R69 Illness, unspecified: Secondary | ICD-10-CM | POA: Diagnosis not present

## 2021-02-05 DIAGNOSIS — R69 Illness, unspecified: Secondary | ICD-10-CM | POA: Diagnosis not present

## 2021-02-06 DIAGNOSIS — R69 Illness, unspecified: Secondary | ICD-10-CM | POA: Diagnosis not present

## 2021-02-07 DIAGNOSIS — R69 Illness, unspecified: Secondary | ICD-10-CM | POA: Diagnosis not present

## 2021-02-08 DIAGNOSIS — R69 Illness, unspecified: Secondary | ICD-10-CM | POA: Diagnosis not present

## 2021-02-09 DIAGNOSIS — R69 Illness, unspecified: Secondary | ICD-10-CM | POA: Diagnosis not present

## 2021-02-10 DIAGNOSIS — R69 Illness, unspecified: Secondary | ICD-10-CM | POA: Diagnosis not present

## 2021-02-11 DIAGNOSIS — R69 Illness, unspecified: Secondary | ICD-10-CM | POA: Diagnosis not present

## 2021-02-12 DIAGNOSIS — R69 Illness, unspecified: Secondary | ICD-10-CM | POA: Diagnosis not present

## 2021-02-13 DIAGNOSIS — R69 Illness, unspecified: Secondary | ICD-10-CM | POA: Diagnosis not present

## 2021-02-14 DIAGNOSIS — R69 Illness, unspecified: Secondary | ICD-10-CM | POA: Diagnosis not present

## 2021-02-15 DIAGNOSIS — E785 Hyperlipidemia, unspecified: Secondary | ICD-10-CM | POA: Diagnosis not present

## 2021-02-15 DIAGNOSIS — R4189 Other symptoms and signs involving cognitive functions and awareness: Secondary | ICD-10-CM | POA: Diagnosis not present

## 2021-02-15 DIAGNOSIS — I739 Peripheral vascular disease, unspecified: Secondary | ICD-10-CM | POA: Diagnosis not present

## 2021-02-15 DIAGNOSIS — R69 Illness, unspecified: Secondary | ICD-10-CM | POA: Diagnosis not present

## 2021-02-15 DIAGNOSIS — H9190 Unspecified hearing loss, unspecified ear: Secondary | ICD-10-CM | POA: Diagnosis not present

## 2021-02-15 DIAGNOSIS — M79604 Pain in right leg: Secondary | ICD-10-CM | POA: Diagnosis not present

## 2021-02-15 DIAGNOSIS — R300 Dysuria: Secondary | ICD-10-CM | POA: Diagnosis not present

## 2021-02-15 DIAGNOSIS — I10 Essential (primary) hypertension: Secondary | ICD-10-CM | POA: Diagnosis not present

## 2021-02-15 DIAGNOSIS — M7989 Other specified soft tissue disorders: Secondary | ICD-10-CM | POA: Diagnosis not present

## 2021-02-16 DIAGNOSIS — R69 Illness, unspecified: Secondary | ICD-10-CM | POA: Diagnosis not present

## 2021-02-16 DIAGNOSIS — E785 Hyperlipidemia, unspecified: Secondary | ICD-10-CM | POA: Diagnosis not present

## 2021-02-16 DIAGNOSIS — E559 Vitamin D deficiency, unspecified: Secondary | ICD-10-CM | POA: Diagnosis not present

## 2021-02-16 DIAGNOSIS — D518 Other vitamin B12 deficiency anemias: Secondary | ICD-10-CM | POA: Diagnosis not present

## 2021-02-16 DIAGNOSIS — I10 Essential (primary) hypertension: Secondary | ICD-10-CM | POA: Diagnosis not present

## 2021-02-16 DIAGNOSIS — E038 Other specified hypothyroidism: Secondary | ICD-10-CM | POA: Diagnosis not present

## 2021-02-16 DIAGNOSIS — E119 Type 2 diabetes mellitus without complications: Secondary | ICD-10-CM | POA: Diagnosis not present

## 2021-02-17 DIAGNOSIS — R601 Generalized edema: Secondary | ICD-10-CM | POA: Diagnosis not present

## 2021-02-17 DIAGNOSIS — R69 Illness, unspecified: Secondary | ICD-10-CM | POA: Diagnosis not present

## 2021-02-18 DIAGNOSIS — R69 Illness, unspecified: Secondary | ICD-10-CM | POA: Diagnosis not present

## 2021-02-19 DIAGNOSIS — R69 Illness, unspecified: Secondary | ICD-10-CM | POA: Diagnosis not present

## 2021-02-20 DIAGNOSIS — R69 Illness, unspecified: Secondary | ICD-10-CM | POA: Diagnosis not present

## 2021-02-21 DIAGNOSIS — R69 Illness, unspecified: Secondary | ICD-10-CM | POA: Diagnosis not present

## 2021-02-22 DIAGNOSIS — R69 Illness, unspecified: Secondary | ICD-10-CM | POA: Diagnosis not present

## 2021-02-22 DIAGNOSIS — R300 Dysuria: Secondary | ICD-10-CM | POA: Diagnosis not present

## 2021-02-23 DIAGNOSIS — E038 Other specified hypothyroidism: Secondary | ICD-10-CM | POA: Diagnosis not present

## 2021-02-23 DIAGNOSIS — E7849 Other hyperlipidemia: Secondary | ICD-10-CM | POA: Diagnosis not present

## 2021-02-23 DIAGNOSIS — D518 Other vitamin B12 deficiency anemias: Secondary | ICD-10-CM | POA: Diagnosis not present

## 2021-02-23 DIAGNOSIS — E559 Vitamin D deficiency, unspecified: Secondary | ICD-10-CM | POA: Diagnosis not present

## 2021-02-23 DIAGNOSIS — R69 Illness, unspecified: Secondary | ICD-10-CM | POA: Diagnosis not present

## 2021-02-23 DIAGNOSIS — E119 Type 2 diabetes mellitus without complications: Secondary | ICD-10-CM | POA: Diagnosis not present

## 2021-02-23 DIAGNOSIS — Z79899 Other long term (current) drug therapy: Secondary | ICD-10-CM | POA: Diagnosis not present

## 2021-02-24 DIAGNOSIS — R69 Illness, unspecified: Secondary | ICD-10-CM | POA: Diagnosis not present

## 2021-02-25 DIAGNOSIS — R69 Illness, unspecified: Secondary | ICD-10-CM | POA: Diagnosis not present

## 2021-02-26 DIAGNOSIS — R69 Illness, unspecified: Secondary | ICD-10-CM | POA: Diagnosis not present

## 2021-02-27 DIAGNOSIS — R69 Illness, unspecified: Secondary | ICD-10-CM | POA: Diagnosis not present

## 2021-02-28 DIAGNOSIS — R69 Illness, unspecified: Secondary | ICD-10-CM | POA: Diagnosis not present

## 2021-03-01 DIAGNOSIS — R69 Illness, unspecified: Secondary | ICD-10-CM | POA: Diagnosis not present

## 2021-03-02 DIAGNOSIS — R69 Illness, unspecified: Secondary | ICD-10-CM | POA: Diagnosis not present

## 2021-03-03 DIAGNOSIS — R69 Illness, unspecified: Secondary | ICD-10-CM | POA: Diagnosis not present

## 2021-03-04 DIAGNOSIS — R69 Illness, unspecified: Secondary | ICD-10-CM | POA: Diagnosis not present

## 2021-03-05 DIAGNOSIS — R69 Illness, unspecified: Secondary | ICD-10-CM | POA: Diagnosis not present

## 2021-03-06 DIAGNOSIS — R69 Illness, unspecified: Secondary | ICD-10-CM | POA: Diagnosis not present

## 2021-03-07 DIAGNOSIS — R69 Illness, unspecified: Secondary | ICD-10-CM | POA: Diagnosis not present

## 2021-03-08 DIAGNOSIS — R69 Illness, unspecified: Secondary | ICD-10-CM | POA: Diagnosis not present

## 2021-03-09 DIAGNOSIS — E7849 Other hyperlipidemia: Secondary | ICD-10-CM | POA: Diagnosis not present

## 2021-03-09 DIAGNOSIS — F5101 Primary insomnia: Secondary | ICD-10-CM | POA: Diagnosis not present

## 2021-03-09 DIAGNOSIS — N183 Chronic kidney disease, stage 3 unspecified: Secondary | ICD-10-CM | POA: Diagnosis not present

## 2021-03-09 DIAGNOSIS — I129 Hypertensive chronic kidney disease with stage 1 through stage 4 chronic kidney disease, or unspecified chronic kidney disease: Secondary | ICD-10-CM | POA: Diagnosis not present

## 2021-03-09 DIAGNOSIS — I10 Essential (primary) hypertension: Secondary | ICD-10-CM | POA: Diagnosis not present

## 2021-03-09 DIAGNOSIS — F419 Anxiety disorder, unspecified: Secondary | ICD-10-CM | POA: Diagnosis not present

## 2021-03-09 DIAGNOSIS — R69 Illness, unspecified: Secondary | ICD-10-CM | POA: Diagnosis not present

## 2021-03-10 DIAGNOSIS — R69 Illness, unspecified: Secondary | ICD-10-CM | POA: Diagnosis not present

## 2021-03-11 DIAGNOSIS — R69 Illness, unspecified: Secondary | ICD-10-CM | POA: Diagnosis not present

## 2021-03-12 DIAGNOSIS — R69 Illness, unspecified: Secondary | ICD-10-CM | POA: Diagnosis not present

## 2021-03-13 DIAGNOSIS — R69 Illness, unspecified: Secondary | ICD-10-CM | POA: Diagnosis not present

## 2021-03-14 DIAGNOSIS — R69 Illness, unspecified: Secondary | ICD-10-CM | POA: Diagnosis not present

## 2021-03-15 DIAGNOSIS — R69 Illness, unspecified: Secondary | ICD-10-CM | POA: Diagnosis not present

## 2021-03-16 DIAGNOSIS — I10 Essential (primary) hypertension: Secondary | ICD-10-CM | POA: Diagnosis not present

## 2021-03-16 DIAGNOSIS — E785 Hyperlipidemia, unspecified: Secondary | ICD-10-CM | POA: Diagnosis not present

## 2021-03-16 DIAGNOSIS — R69 Illness, unspecified: Secondary | ICD-10-CM | POA: Diagnosis not present

## 2021-03-16 DIAGNOSIS — E559 Vitamin D deficiency, unspecified: Secondary | ICD-10-CM | POA: Diagnosis not present

## 2021-03-16 DIAGNOSIS — R4189 Other symptoms and signs involving cognitive functions and awareness: Secondary | ICD-10-CM | POA: Diagnosis not present

## 2021-03-16 DIAGNOSIS — I739 Peripheral vascular disease, unspecified: Secondary | ICD-10-CM | POA: Diagnosis not present

## 2021-03-16 DIAGNOSIS — E876 Hypokalemia: Secondary | ICD-10-CM | POA: Diagnosis not present

## 2021-03-17 DIAGNOSIS — R69 Illness, unspecified: Secondary | ICD-10-CM | POA: Diagnosis not present

## 2021-03-18 DIAGNOSIS — R69 Illness, unspecified: Secondary | ICD-10-CM | POA: Diagnosis not present

## 2021-03-19 DIAGNOSIS — R69 Illness, unspecified: Secondary | ICD-10-CM | POA: Diagnosis not present

## 2021-03-20 DIAGNOSIS — R69 Illness, unspecified: Secondary | ICD-10-CM | POA: Diagnosis not present

## 2021-03-21 DIAGNOSIS — U071 COVID-19: Secondary | ICD-10-CM | POA: Diagnosis not present

## 2021-03-21 DIAGNOSIS — Z20828 Contact with and (suspected) exposure to other viral communicable diseases: Secondary | ICD-10-CM | POA: Diagnosis not present

## 2021-03-21 DIAGNOSIS — R69 Illness, unspecified: Secondary | ICD-10-CM | POA: Diagnosis not present

## 2021-03-22 DIAGNOSIS — R69 Illness, unspecified: Secondary | ICD-10-CM | POA: Diagnosis not present

## 2021-03-23 DIAGNOSIS — F419 Anxiety disorder, unspecified: Secondary | ICD-10-CM | POA: Diagnosis not present

## 2021-03-23 DIAGNOSIS — R69 Illness, unspecified: Secondary | ICD-10-CM | POA: Diagnosis not present

## 2021-03-23 DIAGNOSIS — F5101 Primary insomnia: Secondary | ICD-10-CM | POA: Diagnosis not present

## 2021-03-24 DIAGNOSIS — R69 Illness, unspecified: Secondary | ICD-10-CM | POA: Diagnosis not present

## 2021-03-25 DIAGNOSIS — R69 Illness, unspecified: Secondary | ICD-10-CM | POA: Diagnosis not present

## 2021-03-26 DIAGNOSIS — R69 Illness, unspecified: Secondary | ICD-10-CM | POA: Diagnosis not present

## 2021-03-27 DIAGNOSIS — R69 Illness, unspecified: Secondary | ICD-10-CM | POA: Diagnosis not present

## 2021-03-28 DIAGNOSIS — R69 Illness, unspecified: Secondary | ICD-10-CM | POA: Diagnosis not present

## 2021-03-28 DIAGNOSIS — E785 Hyperlipidemia, unspecified: Secondary | ICD-10-CM | POA: Diagnosis not present

## 2021-03-28 DIAGNOSIS — E038 Other specified hypothyroidism: Secondary | ICD-10-CM | POA: Diagnosis not present

## 2021-03-28 DIAGNOSIS — E119 Type 2 diabetes mellitus without complications: Secondary | ICD-10-CM | POA: Diagnosis not present

## 2021-03-28 DIAGNOSIS — D518 Other vitamin B12 deficiency anemias: Secondary | ICD-10-CM | POA: Diagnosis not present

## 2021-03-28 DIAGNOSIS — E559 Vitamin D deficiency, unspecified: Secondary | ICD-10-CM | POA: Diagnosis not present

## 2021-03-28 DIAGNOSIS — I10 Essential (primary) hypertension: Secondary | ICD-10-CM | POA: Diagnosis not present

## 2021-03-29 DIAGNOSIS — R69 Illness, unspecified: Secondary | ICD-10-CM | POA: Diagnosis not present

## 2021-03-30 DIAGNOSIS — R69 Illness, unspecified: Secondary | ICD-10-CM | POA: Diagnosis not present

## 2021-03-31 DIAGNOSIS — R69 Illness, unspecified: Secondary | ICD-10-CM | POA: Diagnosis not present

## 2021-04-01 DIAGNOSIS — R69 Illness, unspecified: Secondary | ICD-10-CM | POA: Diagnosis not present

## 2021-04-02 DIAGNOSIS — R69 Illness, unspecified: Secondary | ICD-10-CM | POA: Diagnosis not present

## 2021-04-03 DIAGNOSIS — D518 Other vitamin B12 deficiency anemias: Secondary | ICD-10-CM | POA: Diagnosis not present

## 2021-04-03 DIAGNOSIS — E119 Type 2 diabetes mellitus without complications: Secondary | ICD-10-CM | POA: Diagnosis not present

## 2021-04-03 DIAGNOSIS — Z79899 Other long term (current) drug therapy: Secondary | ICD-10-CM | POA: Diagnosis not present

## 2021-04-03 DIAGNOSIS — E038 Other specified hypothyroidism: Secondary | ICD-10-CM | POA: Diagnosis not present

## 2021-04-03 DIAGNOSIS — E559 Vitamin D deficiency, unspecified: Secondary | ICD-10-CM | POA: Diagnosis not present

## 2021-04-03 DIAGNOSIS — R69 Illness, unspecified: Secondary | ICD-10-CM | POA: Diagnosis not present

## 2021-04-03 DIAGNOSIS — E7849 Other hyperlipidemia: Secondary | ICD-10-CM | POA: Diagnosis not present

## 2021-04-04 DIAGNOSIS — R69 Illness, unspecified: Secondary | ICD-10-CM | POA: Diagnosis not present

## 2021-04-05 DIAGNOSIS — R69 Illness, unspecified: Secondary | ICD-10-CM | POA: Diagnosis not present

## 2021-04-06 DIAGNOSIS — F419 Anxiety disorder, unspecified: Secondary | ICD-10-CM | POA: Diagnosis not present

## 2021-04-06 DIAGNOSIS — F5101 Primary insomnia: Secondary | ICD-10-CM | POA: Diagnosis not present

## 2021-04-06 DIAGNOSIS — R69 Illness, unspecified: Secondary | ICD-10-CM | POA: Diagnosis not present

## 2021-04-07 DIAGNOSIS — R69 Illness, unspecified: Secondary | ICD-10-CM | POA: Diagnosis not present

## 2021-04-08 DIAGNOSIS — R69 Illness, unspecified: Secondary | ICD-10-CM | POA: Diagnosis not present

## 2021-04-09 DIAGNOSIS — I129 Hypertensive chronic kidney disease with stage 1 through stage 4 chronic kidney disease, or unspecified chronic kidney disease: Secondary | ICD-10-CM | POA: Diagnosis not present

## 2021-04-09 DIAGNOSIS — N183 Chronic kidney disease, stage 3 unspecified: Secondary | ICD-10-CM | POA: Diagnosis not present

## 2021-04-09 DIAGNOSIS — R69 Illness, unspecified: Secondary | ICD-10-CM | POA: Diagnosis not present

## 2021-04-09 DIAGNOSIS — E7849 Other hyperlipidemia: Secondary | ICD-10-CM | POA: Diagnosis not present

## 2021-04-10 DIAGNOSIS — R69 Illness, unspecified: Secondary | ICD-10-CM | POA: Diagnosis not present

## 2021-04-11 DIAGNOSIS — R011 Cardiac murmur, unspecified: Secondary | ICD-10-CM | POA: Diagnosis not present

## 2021-04-11 DIAGNOSIS — R69 Illness, unspecified: Secondary | ICD-10-CM | POA: Diagnosis not present

## 2021-04-12 DIAGNOSIS — R69 Illness, unspecified: Secondary | ICD-10-CM | POA: Diagnosis not present

## 2021-04-13 DIAGNOSIS — E559 Vitamin D deficiency, unspecified: Secondary | ICD-10-CM | POA: Diagnosis not present

## 2021-04-13 DIAGNOSIS — I10 Essential (primary) hypertension: Secondary | ICD-10-CM | POA: Diagnosis not present

## 2021-04-13 DIAGNOSIS — H9190 Unspecified hearing loss, unspecified ear: Secondary | ICD-10-CM | POA: Diagnosis not present

## 2021-04-13 DIAGNOSIS — R69 Illness, unspecified: Secondary | ICD-10-CM | POA: Diagnosis not present

## 2021-04-13 DIAGNOSIS — G629 Polyneuropathy, unspecified: Secondary | ICD-10-CM | POA: Diagnosis not present

## 2021-04-13 DIAGNOSIS — I739 Peripheral vascular disease, unspecified: Secondary | ICD-10-CM | POA: Diagnosis not present

## 2021-04-13 DIAGNOSIS — Z79899 Other long term (current) drug therapy: Secondary | ICD-10-CM | POA: Diagnosis not present

## 2021-04-13 DIAGNOSIS — I517 Cardiomegaly: Secondary | ICD-10-CM | POA: Diagnosis not present

## 2021-04-13 DIAGNOSIS — E785 Hyperlipidemia, unspecified: Secondary | ICD-10-CM | POA: Diagnosis not present

## 2021-04-14 DIAGNOSIS — R69 Illness, unspecified: Secondary | ICD-10-CM | POA: Diagnosis not present

## 2021-04-15 DIAGNOSIS — R69 Illness, unspecified: Secondary | ICD-10-CM | POA: Diagnosis not present

## 2021-04-16 DIAGNOSIS — R69 Illness, unspecified: Secondary | ICD-10-CM | POA: Diagnosis not present

## 2021-04-17 DIAGNOSIS — R69 Illness, unspecified: Secondary | ICD-10-CM | POA: Diagnosis not present

## 2021-04-18 DIAGNOSIS — R69 Illness, unspecified: Secondary | ICD-10-CM | POA: Diagnosis not present

## 2021-04-19 DIAGNOSIS — R69 Illness, unspecified: Secondary | ICD-10-CM | POA: Diagnosis not present

## 2021-04-20 DIAGNOSIS — D518 Other vitamin B12 deficiency anemias: Secondary | ICD-10-CM | POA: Diagnosis not present

## 2021-04-20 DIAGNOSIS — E119 Type 2 diabetes mellitus without complications: Secondary | ICD-10-CM | POA: Diagnosis not present

## 2021-04-20 DIAGNOSIS — E559 Vitamin D deficiency, unspecified: Secondary | ICD-10-CM | POA: Diagnosis not present

## 2021-04-20 DIAGNOSIS — I10 Essential (primary) hypertension: Secondary | ICD-10-CM | POA: Diagnosis not present

## 2021-04-20 DIAGNOSIS — E038 Other specified hypothyroidism: Secondary | ICD-10-CM | POA: Diagnosis not present

## 2021-04-20 DIAGNOSIS — R69 Illness, unspecified: Secondary | ICD-10-CM | POA: Diagnosis not present

## 2021-04-20 DIAGNOSIS — E785 Hyperlipidemia, unspecified: Secondary | ICD-10-CM | POA: Diagnosis not present

## 2021-04-21 DIAGNOSIS — R69 Illness, unspecified: Secondary | ICD-10-CM | POA: Diagnosis not present

## 2021-04-22 DIAGNOSIS — R69 Illness, unspecified: Secondary | ICD-10-CM | POA: Diagnosis not present

## 2021-04-23 DIAGNOSIS — R69 Illness, unspecified: Secondary | ICD-10-CM | POA: Diagnosis not present

## 2021-04-24 DIAGNOSIS — R69 Illness, unspecified: Secondary | ICD-10-CM | POA: Diagnosis not present

## 2021-04-25 DIAGNOSIS — R69 Illness, unspecified: Secondary | ICD-10-CM | POA: Diagnosis not present

## 2021-04-26 DIAGNOSIS — R69 Illness, unspecified: Secondary | ICD-10-CM | POA: Diagnosis not present

## 2021-04-27 DIAGNOSIS — R69 Illness, unspecified: Secondary | ICD-10-CM | POA: Diagnosis not present

## 2021-04-28 DIAGNOSIS — R69 Illness, unspecified: Secondary | ICD-10-CM | POA: Diagnosis not present

## 2021-04-28 DIAGNOSIS — I10 Essential (primary) hypertension: Secondary | ICD-10-CM | POA: Diagnosis not present

## 2021-04-29 DIAGNOSIS — R69 Illness, unspecified: Secondary | ICD-10-CM | POA: Diagnosis not present

## 2021-04-30 DIAGNOSIS — R69 Illness, unspecified: Secondary | ICD-10-CM | POA: Diagnosis not present

## 2021-05-01 DIAGNOSIS — R69 Illness, unspecified: Secondary | ICD-10-CM | POA: Diagnosis not present

## 2021-05-02 DIAGNOSIS — R69 Illness, unspecified: Secondary | ICD-10-CM | POA: Diagnosis not present

## 2021-05-03 DIAGNOSIS — R69 Illness, unspecified: Secondary | ICD-10-CM | POA: Diagnosis not present

## 2021-05-04 DIAGNOSIS — R69 Illness, unspecified: Secondary | ICD-10-CM | POA: Diagnosis not present

## 2021-05-04 DIAGNOSIS — F419 Anxiety disorder, unspecified: Secondary | ICD-10-CM | POA: Diagnosis not present

## 2021-05-04 DIAGNOSIS — F5101 Primary insomnia: Secondary | ICD-10-CM | POA: Diagnosis not present

## 2021-05-05 DIAGNOSIS — R69 Illness, unspecified: Secondary | ICD-10-CM | POA: Diagnosis not present

## 2021-05-06 DIAGNOSIS — R69 Illness, unspecified: Secondary | ICD-10-CM | POA: Diagnosis not present

## 2021-05-07 DIAGNOSIS — R69 Illness, unspecified: Secondary | ICD-10-CM | POA: Diagnosis not present

## 2021-05-08 DIAGNOSIS — R69 Illness, unspecified: Secondary | ICD-10-CM | POA: Diagnosis not present

## 2021-05-09 DIAGNOSIS — R69 Illness, unspecified: Secondary | ICD-10-CM | POA: Diagnosis not present

## 2021-05-10 DIAGNOSIS — R69 Illness, unspecified: Secondary | ICD-10-CM | POA: Diagnosis not present

## 2021-05-11 DIAGNOSIS — R69 Illness, unspecified: Secondary | ICD-10-CM | POA: Diagnosis not present

## 2021-05-12 DIAGNOSIS — I10 Essential (primary) hypertension: Secondary | ICD-10-CM | POA: Diagnosis not present

## 2021-05-12 DIAGNOSIS — R69 Illness, unspecified: Secondary | ICD-10-CM | POA: Diagnosis not present

## 2021-05-13 DIAGNOSIS — R69 Illness, unspecified: Secondary | ICD-10-CM | POA: Diagnosis not present

## 2021-05-14 DIAGNOSIS — R69 Illness, unspecified: Secondary | ICD-10-CM | POA: Diagnosis not present

## 2021-05-15 DIAGNOSIS — R69 Illness, unspecified: Secondary | ICD-10-CM | POA: Diagnosis not present

## 2021-05-16 DIAGNOSIS — R69 Illness, unspecified: Secondary | ICD-10-CM | POA: Diagnosis not present

## 2021-05-17 DIAGNOSIS — R69 Illness, unspecified: Secondary | ICD-10-CM | POA: Diagnosis not present

## 2021-05-18 DIAGNOSIS — I10 Essential (primary) hypertension: Secondary | ICD-10-CM | POA: Diagnosis not present

## 2021-05-18 DIAGNOSIS — E785 Hyperlipidemia, unspecified: Secondary | ICD-10-CM | POA: Diagnosis not present

## 2021-05-18 DIAGNOSIS — R69 Illness, unspecified: Secondary | ICD-10-CM | POA: Diagnosis not present

## 2021-05-18 DIAGNOSIS — Z79899 Other long term (current) drug therapy: Secondary | ICD-10-CM | POA: Diagnosis not present

## 2021-05-18 DIAGNOSIS — R4189 Other symptoms and signs involving cognitive functions and awareness: Secondary | ICD-10-CM | POA: Diagnosis not present

## 2021-05-18 DIAGNOSIS — F0391 Unspecified dementia with behavioral disturbance: Secondary | ICD-10-CM | POA: Diagnosis not present

## 2021-05-18 DIAGNOSIS — G629 Polyneuropathy, unspecified: Secondary | ICD-10-CM | POA: Diagnosis not present

## 2021-05-18 DIAGNOSIS — F5101 Primary insomnia: Secondary | ICD-10-CM | POA: Diagnosis not present

## 2021-05-18 DIAGNOSIS — I739 Peripheral vascular disease, unspecified: Secondary | ICD-10-CM | POA: Diagnosis not present

## 2021-05-18 DIAGNOSIS — F419 Anxiety disorder, unspecified: Secondary | ICD-10-CM | POA: Diagnosis not present

## 2021-05-19 DIAGNOSIS — R69 Illness, unspecified: Secondary | ICD-10-CM | POA: Diagnosis not present

## 2021-05-20 DIAGNOSIS — R69 Illness, unspecified: Secondary | ICD-10-CM | POA: Diagnosis not present

## 2021-05-21 DIAGNOSIS — R69 Illness, unspecified: Secondary | ICD-10-CM | POA: Diagnosis not present

## 2021-05-22 DIAGNOSIS — R69 Illness, unspecified: Secondary | ICD-10-CM | POA: Diagnosis not present

## 2021-05-23 DIAGNOSIS — R69 Illness, unspecified: Secondary | ICD-10-CM | POA: Diagnosis not present

## 2021-05-23 DIAGNOSIS — Z79899 Other long term (current) drug therapy: Secondary | ICD-10-CM | POA: Diagnosis not present

## 2021-05-23 DIAGNOSIS — I739 Peripheral vascular disease, unspecified: Secondary | ICD-10-CM | POA: Diagnosis not present

## 2021-05-23 DIAGNOSIS — G629 Polyneuropathy, unspecified: Secondary | ICD-10-CM | POA: Diagnosis not present

## 2021-05-24 DIAGNOSIS — R69 Illness, unspecified: Secondary | ICD-10-CM | POA: Diagnosis not present

## 2021-05-25 DIAGNOSIS — R69 Illness, unspecified: Secondary | ICD-10-CM | POA: Diagnosis not present

## 2021-05-26 DIAGNOSIS — I10 Essential (primary) hypertension: Secondary | ICD-10-CM | POA: Diagnosis not present

## 2021-05-26 DIAGNOSIS — E785 Hyperlipidemia, unspecified: Secondary | ICD-10-CM | POA: Diagnosis not present

## 2021-05-26 DIAGNOSIS — E559 Vitamin D deficiency, unspecified: Secondary | ICD-10-CM | POA: Diagnosis not present

## 2021-05-26 DIAGNOSIS — D518 Other vitamin B12 deficiency anemias: Secondary | ICD-10-CM | POA: Diagnosis not present

## 2021-05-26 DIAGNOSIS — E119 Type 2 diabetes mellitus without complications: Secondary | ICD-10-CM | POA: Diagnosis not present

## 2021-05-26 DIAGNOSIS — R69 Illness, unspecified: Secondary | ICD-10-CM | POA: Diagnosis not present

## 2021-05-26 DIAGNOSIS — E038 Other specified hypothyroidism: Secondary | ICD-10-CM | POA: Diagnosis not present

## 2021-05-27 DIAGNOSIS — R69 Illness, unspecified: Secondary | ICD-10-CM | POA: Diagnosis not present

## 2021-05-28 DIAGNOSIS — R69 Illness, unspecified: Secondary | ICD-10-CM | POA: Diagnosis not present

## 2021-05-29 DIAGNOSIS — R69 Illness, unspecified: Secondary | ICD-10-CM | POA: Diagnosis not present

## 2021-05-30 DIAGNOSIS — R69 Illness, unspecified: Secondary | ICD-10-CM | POA: Diagnosis not present

## 2021-05-31 DIAGNOSIS — R69 Illness, unspecified: Secondary | ICD-10-CM | POA: Diagnosis not present

## 2021-06-01 DIAGNOSIS — R443 Hallucinations, unspecified: Secondary | ICD-10-CM | POA: Diagnosis not present

## 2021-06-01 DIAGNOSIS — F419 Anxiety disorder, unspecified: Secondary | ICD-10-CM | POA: Diagnosis not present

## 2021-06-01 DIAGNOSIS — F0391 Unspecified dementia with behavioral disturbance: Secondary | ICD-10-CM | POA: Diagnosis not present

## 2021-06-01 DIAGNOSIS — F5101 Primary insomnia: Secondary | ICD-10-CM | POA: Diagnosis not present

## 2021-06-01 DIAGNOSIS — R69 Illness, unspecified: Secondary | ICD-10-CM | POA: Diagnosis not present

## 2021-06-02 DIAGNOSIS — R69 Illness, unspecified: Secondary | ICD-10-CM | POA: Diagnosis not present

## 2021-06-03 DIAGNOSIS — R69 Illness, unspecified: Secondary | ICD-10-CM | POA: Diagnosis not present

## 2021-06-04 DIAGNOSIS — R69 Illness, unspecified: Secondary | ICD-10-CM | POA: Diagnosis not present

## 2021-06-05 DIAGNOSIS — R69 Illness, unspecified: Secondary | ICD-10-CM | POA: Diagnosis not present

## 2021-06-06 DIAGNOSIS — R69 Illness, unspecified: Secondary | ICD-10-CM | POA: Diagnosis not present

## 2021-06-07 DIAGNOSIS — R69 Illness, unspecified: Secondary | ICD-10-CM | POA: Diagnosis not present

## 2021-06-08 DIAGNOSIS — R69 Illness, unspecified: Secondary | ICD-10-CM | POA: Diagnosis not present

## 2021-06-09 DIAGNOSIS — R69 Illness, unspecified: Secondary | ICD-10-CM | POA: Diagnosis not present

## 2021-06-09 DIAGNOSIS — I739 Peripheral vascular disease, unspecified: Secondary | ICD-10-CM | POA: Diagnosis not present

## 2021-06-09 DIAGNOSIS — E559 Vitamin D deficiency, unspecified: Secondary | ICD-10-CM | POA: Diagnosis not present

## 2021-06-09 DIAGNOSIS — I10 Essential (primary) hypertension: Secondary | ICD-10-CM | POA: Diagnosis not present

## 2021-06-09 DIAGNOSIS — E785 Hyperlipidemia, unspecified: Secondary | ICD-10-CM | POA: Diagnosis not present

## 2021-06-10 DIAGNOSIS — R69 Illness, unspecified: Secondary | ICD-10-CM | POA: Diagnosis not present

## 2021-06-11 DIAGNOSIS — R69 Illness, unspecified: Secondary | ICD-10-CM | POA: Diagnosis not present

## 2021-06-12 DIAGNOSIS — R69 Illness, unspecified: Secondary | ICD-10-CM | POA: Diagnosis not present

## 2021-06-13 DIAGNOSIS — D518 Other vitamin B12 deficiency anemias: Secondary | ICD-10-CM | POA: Diagnosis not present

## 2021-06-13 DIAGNOSIS — I10 Essential (primary) hypertension: Secondary | ICD-10-CM | POA: Diagnosis not present

## 2021-06-13 DIAGNOSIS — Z79899 Other long term (current) drug therapy: Secondary | ICD-10-CM | POA: Diagnosis not present

## 2021-06-13 DIAGNOSIS — E559 Vitamin D deficiency, unspecified: Secondary | ICD-10-CM | POA: Diagnosis not present

## 2021-06-13 DIAGNOSIS — R69 Illness, unspecified: Secondary | ICD-10-CM | POA: Diagnosis not present

## 2021-06-13 DIAGNOSIS — E038 Other specified hypothyroidism: Secondary | ICD-10-CM | POA: Diagnosis not present

## 2021-06-13 DIAGNOSIS — E119 Type 2 diabetes mellitus without complications: Secondary | ICD-10-CM | POA: Diagnosis not present

## 2021-06-13 DIAGNOSIS — E7849 Other hyperlipidemia: Secondary | ICD-10-CM | POA: Diagnosis not present

## 2021-06-14 DIAGNOSIS — R69 Illness, unspecified: Secondary | ICD-10-CM | POA: Diagnosis not present

## 2021-06-15 DIAGNOSIS — I739 Peripheral vascular disease, unspecified: Secondary | ICD-10-CM | POA: Diagnosis not present

## 2021-06-15 DIAGNOSIS — E785 Hyperlipidemia, unspecified: Secondary | ICD-10-CM | POA: Diagnosis not present

## 2021-06-15 DIAGNOSIS — R69 Illness, unspecified: Secondary | ICD-10-CM | POA: Diagnosis not present

## 2021-06-15 DIAGNOSIS — R4189 Other symptoms and signs involving cognitive functions and awareness: Secondary | ICD-10-CM | POA: Diagnosis not present

## 2021-06-15 DIAGNOSIS — I10 Essential (primary) hypertension: Secondary | ICD-10-CM | POA: Diagnosis not present

## 2021-06-15 DIAGNOSIS — E559 Vitamin D deficiency, unspecified: Secondary | ICD-10-CM | POA: Diagnosis not present

## 2021-06-15 DIAGNOSIS — G629 Polyneuropathy, unspecified: Secondary | ICD-10-CM | POA: Diagnosis not present

## 2021-06-16 DIAGNOSIS — R69 Illness, unspecified: Secondary | ICD-10-CM | POA: Diagnosis not present

## 2021-06-17 DIAGNOSIS — R69 Illness, unspecified: Secondary | ICD-10-CM | POA: Diagnosis not present

## 2021-06-18 DIAGNOSIS — R69 Illness, unspecified: Secondary | ICD-10-CM | POA: Diagnosis not present

## 2021-06-19 DIAGNOSIS — L603 Nail dystrophy: Secondary | ICD-10-CM | POA: Diagnosis not present

## 2021-06-19 DIAGNOSIS — R600 Localized edema: Secondary | ICD-10-CM | POA: Diagnosis not present

## 2021-06-19 DIAGNOSIS — R69 Illness, unspecified: Secondary | ICD-10-CM | POA: Diagnosis not present

## 2021-06-20 DIAGNOSIS — R69 Illness, unspecified: Secondary | ICD-10-CM | POA: Diagnosis not present

## 2021-06-21 DIAGNOSIS — R69 Illness, unspecified: Secondary | ICD-10-CM | POA: Diagnosis not present

## 2021-06-22 DIAGNOSIS — E559 Vitamin D deficiency, unspecified: Secondary | ICD-10-CM | POA: Diagnosis not present

## 2021-06-22 DIAGNOSIS — E785 Hyperlipidemia, unspecified: Secondary | ICD-10-CM | POA: Diagnosis not present

## 2021-06-22 DIAGNOSIS — I10 Essential (primary) hypertension: Secondary | ICD-10-CM | POA: Diagnosis not present

## 2021-06-22 DIAGNOSIS — R69 Illness, unspecified: Secondary | ICD-10-CM | POA: Diagnosis not present

## 2021-06-22 DIAGNOSIS — E119 Type 2 diabetes mellitus without complications: Secondary | ICD-10-CM | POA: Diagnosis not present

## 2021-06-22 DIAGNOSIS — R443 Hallucinations, unspecified: Secondary | ICD-10-CM | POA: Diagnosis not present

## 2021-06-22 DIAGNOSIS — F5101 Primary insomnia: Secondary | ICD-10-CM | POA: Diagnosis not present

## 2021-06-22 DIAGNOSIS — F419 Anxiety disorder, unspecified: Secondary | ICD-10-CM | POA: Diagnosis not present

## 2021-06-22 DIAGNOSIS — E038 Other specified hypothyroidism: Secondary | ICD-10-CM | POA: Diagnosis not present

## 2021-06-22 DIAGNOSIS — D518 Other vitamin B12 deficiency anemias: Secondary | ICD-10-CM | POA: Diagnosis not present

## 2021-06-22 DIAGNOSIS — F03918 Unspecified dementia, unspecified severity, with other behavioral disturbance: Secondary | ICD-10-CM | POA: Diagnosis not present

## 2021-06-23 DIAGNOSIS — R69 Illness, unspecified: Secondary | ICD-10-CM | POA: Diagnosis not present

## 2021-06-24 DIAGNOSIS — R69 Illness, unspecified: Secondary | ICD-10-CM | POA: Diagnosis not present

## 2021-06-25 DIAGNOSIS — R69 Illness, unspecified: Secondary | ICD-10-CM | POA: Diagnosis not present

## 2021-06-26 DIAGNOSIS — R69 Illness, unspecified: Secondary | ICD-10-CM | POA: Diagnosis not present

## 2021-06-27 DIAGNOSIS — R69 Illness, unspecified: Secondary | ICD-10-CM | POA: Diagnosis not present

## 2021-06-28 DIAGNOSIS — R69 Illness, unspecified: Secondary | ICD-10-CM | POA: Diagnosis not present

## 2021-06-29 DIAGNOSIS — R69 Illness, unspecified: Secondary | ICD-10-CM | POA: Diagnosis not present

## 2021-06-30 DIAGNOSIS — R69 Illness, unspecified: Secondary | ICD-10-CM | POA: Diagnosis not present

## 2021-07-01 DIAGNOSIS — R69 Illness, unspecified: Secondary | ICD-10-CM | POA: Diagnosis not present

## 2021-07-02 DIAGNOSIS — R69 Illness, unspecified: Secondary | ICD-10-CM | POA: Diagnosis not present

## 2021-07-03 DIAGNOSIS — R69 Illness, unspecified: Secondary | ICD-10-CM | POA: Diagnosis not present

## 2021-07-04 DIAGNOSIS — R69 Illness, unspecified: Secondary | ICD-10-CM | POA: Diagnosis not present

## 2021-07-05 DIAGNOSIS — R69 Illness, unspecified: Secondary | ICD-10-CM | POA: Diagnosis not present

## 2021-07-05 DIAGNOSIS — I10 Essential (primary) hypertension: Secondary | ICD-10-CM | POA: Diagnosis not present

## 2021-07-06 DIAGNOSIS — F5101 Primary insomnia: Secondary | ICD-10-CM | POA: Diagnosis not present

## 2021-07-06 DIAGNOSIS — F419 Anxiety disorder, unspecified: Secondary | ICD-10-CM | POA: Diagnosis not present

## 2021-07-06 DIAGNOSIS — R443 Hallucinations, unspecified: Secondary | ICD-10-CM | POA: Diagnosis not present

## 2021-07-06 DIAGNOSIS — R69 Illness, unspecified: Secondary | ICD-10-CM | POA: Diagnosis not present

## 2021-07-06 DIAGNOSIS — F03918 Unspecified dementia, unspecified severity, with other behavioral disturbance: Secondary | ICD-10-CM | POA: Diagnosis not present

## 2021-07-07 DIAGNOSIS — R69 Illness, unspecified: Secondary | ICD-10-CM | POA: Diagnosis not present

## 2021-07-08 DIAGNOSIS — I10 Essential (primary) hypertension: Secondary | ICD-10-CM | POA: Diagnosis not present

## 2021-07-08 DIAGNOSIS — E785 Hyperlipidemia, unspecified: Secondary | ICD-10-CM | POA: Diagnosis not present

## 2021-07-08 DIAGNOSIS — I739 Peripheral vascular disease, unspecified: Secondary | ICD-10-CM | POA: Diagnosis not present

## 2021-07-08 DIAGNOSIS — E559 Vitamin D deficiency, unspecified: Secondary | ICD-10-CM | POA: Diagnosis not present

## 2021-07-08 DIAGNOSIS — R69 Illness, unspecified: Secondary | ICD-10-CM | POA: Diagnosis not present

## 2021-07-09 DIAGNOSIS — R69 Illness, unspecified: Secondary | ICD-10-CM | POA: Diagnosis not present

## 2021-07-10 DIAGNOSIS — R69 Illness, unspecified: Secondary | ICD-10-CM | POA: Diagnosis not present

## 2021-07-11 DIAGNOSIS — R69 Illness, unspecified: Secondary | ICD-10-CM | POA: Diagnosis not present

## 2021-07-12 DIAGNOSIS — R69 Illness, unspecified: Secondary | ICD-10-CM | POA: Diagnosis not present

## 2021-07-13 DIAGNOSIS — E785 Hyperlipidemia, unspecified: Secondary | ICD-10-CM | POA: Diagnosis not present

## 2021-07-13 DIAGNOSIS — G629 Polyneuropathy, unspecified: Secondary | ICD-10-CM | POA: Diagnosis not present

## 2021-07-13 DIAGNOSIS — R69 Illness, unspecified: Secondary | ICD-10-CM | POA: Diagnosis not present

## 2021-07-13 DIAGNOSIS — H9190 Unspecified hearing loss, unspecified ear: Secondary | ICD-10-CM | POA: Diagnosis not present

## 2021-07-13 DIAGNOSIS — E876 Hypokalemia: Secondary | ICD-10-CM | POA: Diagnosis not present

## 2021-07-13 DIAGNOSIS — H6121 Impacted cerumen, right ear: Secondary | ICD-10-CM | POA: Diagnosis not present

## 2021-07-13 DIAGNOSIS — I10 Essential (primary) hypertension: Secondary | ICD-10-CM | POA: Diagnosis not present

## 2021-07-13 DIAGNOSIS — I739 Peripheral vascular disease, unspecified: Secondary | ICD-10-CM | POA: Diagnosis not present

## 2021-07-13 DIAGNOSIS — E559 Vitamin D deficiency, unspecified: Secondary | ICD-10-CM | POA: Diagnosis not present

## 2021-07-14 DIAGNOSIS — R69 Illness, unspecified: Secondary | ICD-10-CM | POA: Diagnosis not present

## 2021-07-15 DIAGNOSIS — R69 Illness, unspecified: Secondary | ICD-10-CM | POA: Diagnosis not present

## 2021-07-16 DIAGNOSIS — R69 Illness, unspecified: Secondary | ICD-10-CM | POA: Diagnosis not present

## 2021-07-17 DIAGNOSIS — R69 Illness, unspecified: Secondary | ICD-10-CM | POA: Diagnosis not present

## 2021-07-18 DIAGNOSIS — R69 Illness, unspecified: Secondary | ICD-10-CM | POA: Diagnosis not present

## 2021-07-19 DIAGNOSIS — R69 Illness, unspecified: Secondary | ICD-10-CM | POA: Diagnosis not present

## 2021-07-20 DIAGNOSIS — R69 Illness, unspecified: Secondary | ICD-10-CM | POA: Diagnosis not present

## 2021-07-21 DIAGNOSIS — E559 Vitamin D deficiency, unspecified: Secondary | ICD-10-CM | POA: Diagnosis not present

## 2021-07-21 DIAGNOSIS — D518 Other vitamin B12 deficiency anemias: Secondary | ICD-10-CM | POA: Diagnosis not present

## 2021-07-21 DIAGNOSIS — R69 Illness, unspecified: Secondary | ICD-10-CM | POA: Diagnosis not present

## 2021-07-21 DIAGNOSIS — E119 Type 2 diabetes mellitus without complications: Secondary | ICD-10-CM | POA: Diagnosis not present

## 2021-07-21 DIAGNOSIS — E038 Other specified hypothyroidism: Secondary | ICD-10-CM | POA: Diagnosis not present

## 2021-07-21 DIAGNOSIS — E785 Hyperlipidemia, unspecified: Secondary | ICD-10-CM | POA: Diagnosis not present

## 2021-07-21 DIAGNOSIS — I10 Essential (primary) hypertension: Secondary | ICD-10-CM | POA: Diagnosis not present

## 2021-07-22 DIAGNOSIS — R69 Illness, unspecified: Secondary | ICD-10-CM | POA: Diagnosis not present

## 2021-07-23 DIAGNOSIS — E785 Hyperlipidemia, unspecified: Secondary | ICD-10-CM | POA: Diagnosis not present

## 2021-07-23 DIAGNOSIS — I739 Peripheral vascular disease, unspecified: Secondary | ICD-10-CM | POA: Diagnosis not present

## 2021-07-23 DIAGNOSIS — I10 Essential (primary) hypertension: Secondary | ICD-10-CM | POA: Diagnosis not present

## 2021-07-23 DIAGNOSIS — E559 Vitamin D deficiency, unspecified: Secondary | ICD-10-CM | POA: Diagnosis not present

## 2021-07-23 DIAGNOSIS — R69 Illness, unspecified: Secondary | ICD-10-CM | POA: Diagnosis not present

## 2021-07-24 DIAGNOSIS — R69 Illness, unspecified: Secondary | ICD-10-CM | POA: Diagnosis not present

## 2021-07-25 DIAGNOSIS — R69 Illness, unspecified: Secondary | ICD-10-CM | POA: Diagnosis not present

## 2021-07-26 DIAGNOSIS — R69 Illness, unspecified: Secondary | ICD-10-CM | POA: Diagnosis not present

## 2021-07-27 DIAGNOSIS — R69 Illness, unspecified: Secondary | ICD-10-CM | POA: Diagnosis not present

## 2021-07-28 DIAGNOSIS — R69 Illness, unspecified: Secondary | ICD-10-CM | POA: Diagnosis not present

## 2021-07-29 DIAGNOSIS — R69 Illness, unspecified: Secondary | ICD-10-CM | POA: Diagnosis not present

## 2021-07-30 DIAGNOSIS — R69 Illness, unspecified: Secondary | ICD-10-CM | POA: Diagnosis not present

## 2021-07-31 DIAGNOSIS — R69 Illness, unspecified: Secondary | ICD-10-CM | POA: Diagnosis not present

## 2021-08-01 DIAGNOSIS — R69 Illness, unspecified: Secondary | ICD-10-CM | POA: Diagnosis not present

## 2021-08-02 DIAGNOSIS — R69 Illness, unspecified: Secondary | ICD-10-CM | POA: Diagnosis not present

## 2021-08-03 DIAGNOSIS — R69 Illness, unspecified: Secondary | ICD-10-CM | POA: Diagnosis not present

## 2021-08-04 DIAGNOSIS — R059 Cough, unspecified: Secondary | ICD-10-CM | POA: Diagnosis not present

## 2021-08-04 DIAGNOSIS — R69 Illness, unspecified: Secondary | ICD-10-CM | POA: Diagnosis not present

## 2021-08-05 DIAGNOSIS — R69 Illness, unspecified: Secondary | ICD-10-CM | POA: Diagnosis not present

## 2021-08-06 DIAGNOSIS — R69 Illness, unspecified: Secondary | ICD-10-CM | POA: Diagnosis not present

## 2021-08-07 DIAGNOSIS — R69 Illness, unspecified: Secondary | ICD-10-CM | POA: Diagnosis not present

## 2021-08-08 DIAGNOSIS — I10 Essential (primary) hypertension: Secondary | ICD-10-CM | POA: Diagnosis not present

## 2021-08-08 DIAGNOSIS — R69 Illness, unspecified: Secondary | ICD-10-CM | POA: Diagnosis not present

## 2021-08-09 DIAGNOSIS — F03918 Unspecified dementia, unspecified severity, with other behavioral disturbance: Secondary | ICD-10-CM | POA: Diagnosis not present

## 2021-08-09 DIAGNOSIS — R443 Hallucinations, unspecified: Secondary | ICD-10-CM | POA: Diagnosis not present

## 2021-08-09 DIAGNOSIS — F419 Anxiety disorder, unspecified: Secondary | ICD-10-CM | POA: Diagnosis not present

## 2021-08-09 DIAGNOSIS — F5101 Primary insomnia: Secondary | ICD-10-CM | POA: Diagnosis not present

## 2021-08-09 DIAGNOSIS — R69 Illness, unspecified: Secondary | ICD-10-CM | POA: Diagnosis not present

## 2021-08-10 DIAGNOSIS — I10 Essential (primary) hypertension: Secondary | ICD-10-CM | POA: Diagnosis not present

## 2021-08-10 DIAGNOSIS — R69 Illness, unspecified: Secondary | ICD-10-CM | POA: Diagnosis not present

## 2021-08-10 DIAGNOSIS — R4189 Other symptoms and signs involving cognitive functions and awareness: Secondary | ICD-10-CM | POA: Diagnosis not present

## 2021-08-10 DIAGNOSIS — E559 Vitamin D deficiency, unspecified: Secondary | ICD-10-CM | POA: Diagnosis not present

## 2021-08-10 DIAGNOSIS — E785 Hyperlipidemia, unspecified: Secondary | ICD-10-CM | POA: Diagnosis not present

## 2021-08-10 DIAGNOSIS — H9190 Unspecified hearing loss, unspecified ear: Secondary | ICD-10-CM | POA: Diagnosis not present

## 2021-08-10 DIAGNOSIS — R059 Cough, unspecified: Secondary | ICD-10-CM | POA: Diagnosis not present

## 2021-08-10 DIAGNOSIS — I739 Peripheral vascular disease, unspecified: Secondary | ICD-10-CM | POA: Diagnosis not present

## 2021-08-11 DIAGNOSIS — R69 Illness, unspecified: Secondary | ICD-10-CM | POA: Diagnosis not present

## 2021-08-12 DIAGNOSIS — R69 Illness, unspecified: Secondary | ICD-10-CM | POA: Diagnosis not present

## 2021-08-13 DIAGNOSIS — R69 Illness, unspecified: Secondary | ICD-10-CM | POA: Diagnosis not present

## 2021-08-14 DIAGNOSIS — R69 Illness, unspecified: Secondary | ICD-10-CM | POA: Diagnosis not present

## 2021-08-15 DIAGNOSIS — R69 Illness, unspecified: Secondary | ICD-10-CM | POA: Diagnosis not present

## 2021-08-16 DIAGNOSIS — R69 Illness, unspecified: Secondary | ICD-10-CM | POA: Diagnosis not present

## 2021-08-17 DIAGNOSIS — R69 Illness, unspecified: Secondary | ICD-10-CM | POA: Diagnosis not present

## 2021-08-18 DIAGNOSIS — R69 Illness, unspecified: Secondary | ICD-10-CM | POA: Diagnosis not present

## 2021-08-19 DIAGNOSIS — E785 Hyperlipidemia, unspecified: Secondary | ICD-10-CM | POA: Diagnosis not present

## 2021-08-19 DIAGNOSIS — E119 Type 2 diabetes mellitus without complications: Secondary | ICD-10-CM | POA: Diagnosis not present

## 2021-08-19 DIAGNOSIS — I10 Essential (primary) hypertension: Secondary | ICD-10-CM | POA: Diagnosis not present

## 2021-08-19 DIAGNOSIS — D518 Other vitamin B12 deficiency anemias: Secondary | ICD-10-CM | POA: Diagnosis not present

## 2021-08-19 DIAGNOSIS — E559 Vitamin D deficiency, unspecified: Secondary | ICD-10-CM | POA: Diagnosis not present

## 2021-08-19 DIAGNOSIS — E038 Other specified hypothyroidism: Secondary | ICD-10-CM | POA: Diagnosis not present

## 2021-08-19 DIAGNOSIS — R69 Illness, unspecified: Secondary | ICD-10-CM | POA: Diagnosis not present

## 2021-08-20 DIAGNOSIS — I739 Peripheral vascular disease, unspecified: Secondary | ICD-10-CM | POA: Diagnosis not present

## 2021-08-20 DIAGNOSIS — E559 Vitamin D deficiency, unspecified: Secondary | ICD-10-CM | POA: Diagnosis not present

## 2021-08-20 DIAGNOSIS — I10 Essential (primary) hypertension: Secondary | ICD-10-CM | POA: Diagnosis not present

## 2021-08-20 DIAGNOSIS — E785 Hyperlipidemia, unspecified: Secondary | ICD-10-CM | POA: Diagnosis not present

## 2021-08-20 DIAGNOSIS — R69 Illness, unspecified: Secondary | ICD-10-CM | POA: Diagnosis not present

## 2021-08-21 DIAGNOSIS — R69 Illness, unspecified: Secondary | ICD-10-CM | POA: Diagnosis not present

## 2021-08-22 DIAGNOSIS — R69 Illness, unspecified: Secondary | ICD-10-CM | POA: Diagnosis not present

## 2021-08-23 DIAGNOSIS — R69 Illness, unspecified: Secondary | ICD-10-CM | POA: Diagnosis not present

## 2021-08-24 DIAGNOSIS — R69 Illness, unspecified: Secondary | ICD-10-CM | POA: Diagnosis not present

## 2021-08-25 DIAGNOSIS — R69 Illness, unspecified: Secondary | ICD-10-CM | POA: Diagnosis not present

## 2021-08-26 DIAGNOSIS — R69 Illness, unspecified: Secondary | ICD-10-CM | POA: Diagnosis not present

## 2021-08-27 DIAGNOSIS — R69 Illness, unspecified: Secondary | ICD-10-CM | POA: Diagnosis not present

## 2021-08-28 DIAGNOSIS — R69 Illness, unspecified: Secondary | ICD-10-CM | POA: Diagnosis not present

## 2021-08-29 DIAGNOSIS — R69 Illness, unspecified: Secondary | ICD-10-CM | POA: Diagnosis not present

## 2021-08-30 DIAGNOSIS — R69 Illness, unspecified: Secondary | ICD-10-CM | POA: Diagnosis not present

## 2021-08-31 DIAGNOSIS — F5101 Primary insomnia: Secondary | ICD-10-CM | POA: Diagnosis not present

## 2021-08-31 DIAGNOSIS — F03918 Unspecified dementia, unspecified severity, with other behavioral disturbance: Secondary | ICD-10-CM | POA: Diagnosis not present

## 2021-08-31 DIAGNOSIS — R69 Illness, unspecified: Secondary | ICD-10-CM | POA: Diagnosis not present

## 2021-08-31 DIAGNOSIS — F419 Anxiety disorder, unspecified: Secondary | ICD-10-CM | POA: Diagnosis not present

## 2021-08-31 DIAGNOSIS — R443 Hallucinations, unspecified: Secondary | ICD-10-CM | POA: Diagnosis not present

## 2021-09-01 DIAGNOSIS — R69 Illness, unspecified: Secondary | ICD-10-CM | POA: Diagnosis not present

## 2021-09-02 DIAGNOSIS — R69 Illness, unspecified: Secondary | ICD-10-CM | POA: Diagnosis not present

## 2021-09-03 DIAGNOSIS — R69 Illness, unspecified: Secondary | ICD-10-CM | POA: Diagnosis not present

## 2021-09-04 DIAGNOSIS — R69 Illness, unspecified: Secondary | ICD-10-CM | POA: Diagnosis not present

## 2021-09-05 DIAGNOSIS — R69 Illness, unspecified: Secondary | ICD-10-CM | POA: Diagnosis not present

## 2021-09-05 DIAGNOSIS — I10 Essential (primary) hypertension: Secondary | ICD-10-CM | POA: Diagnosis not present

## 2021-09-06 DIAGNOSIS — R69 Illness, unspecified: Secondary | ICD-10-CM | POA: Diagnosis not present

## 2021-09-07 DIAGNOSIS — R69 Illness, unspecified: Secondary | ICD-10-CM | POA: Diagnosis not present

## 2021-09-08 DIAGNOSIS — R69 Illness, unspecified: Secondary | ICD-10-CM | POA: Diagnosis not present

## 2021-09-09 DIAGNOSIS — R69 Illness, unspecified: Secondary | ICD-10-CM | POA: Diagnosis not present

## 2021-09-10 DIAGNOSIS — R69 Illness, unspecified: Secondary | ICD-10-CM | POA: Diagnosis not present

## 2021-09-11 DIAGNOSIS — R69 Illness, unspecified: Secondary | ICD-10-CM | POA: Diagnosis not present

## 2021-09-12 DIAGNOSIS — R69 Illness, unspecified: Secondary | ICD-10-CM | POA: Diagnosis not present

## 2021-09-13 DIAGNOSIS — R69 Illness, unspecified: Secondary | ICD-10-CM | POA: Diagnosis not present

## 2021-09-14 DIAGNOSIS — E119 Type 2 diabetes mellitus without complications: Secondary | ICD-10-CM | POA: Diagnosis not present

## 2021-09-14 DIAGNOSIS — I10 Essential (primary) hypertension: Secondary | ICD-10-CM | POA: Diagnosis not present

## 2021-09-14 DIAGNOSIS — D518 Other vitamin B12 deficiency anemias: Secondary | ICD-10-CM | POA: Diagnosis not present

## 2021-09-14 DIAGNOSIS — R69 Illness, unspecified: Secondary | ICD-10-CM | POA: Diagnosis not present

## 2021-09-14 DIAGNOSIS — E559 Vitamin D deficiency, unspecified: Secondary | ICD-10-CM | POA: Diagnosis not present

## 2021-09-14 DIAGNOSIS — E038 Other specified hypothyroidism: Secondary | ICD-10-CM | POA: Diagnosis not present

## 2021-09-14 DIAGNOSIS — E785 Hyperlipidemia, unspecified: Secondary | ICD-10-CM | POA: Diagnosis not present

## 2021-09-15 DIAGNOSIS — R69 Illness, unspecified: Secondary | ICD-10-CM | POA: Diagnosis not present

## 2021-09-16 DIAGNOSIS — R69 Illness, unspecified: Secondary | ICD-10-CM | POA: Diagnosis not present

## 2021-09-17 DIAGNOSIS — R69 Illness, unspecified: Secondary | ICD-10-CM | POA: Diagnosis not present

## 2021-09-18 DIAGNOSIS — R69 Illness, unspecified: Secondary | ICD-10-CM | POA: Diagnosis not present

## 2021-09-19 DIAGNOSIS — R69 Illness, unspecified: Secondary | ICD-10-CM | POA: Diagnosis not present

## 2021-09-20 DIAGNOSIS — R69 Illness, unspecified: Secondary | ICD-10-CM | POA: Diagnosis not present

## 2021-09-21 DIAGNOSIS — R69 Illness, unspecified: Secondary | ICD-10-CM | POA: Diagnosis not present

## 2021-09-22 DIAGNOSIS — R69 Illness, unspecified: Secondary | ICD-10-CM | POA: Diagnosis not present

## 2021-09-23 DIAGNOSIS — R69 Illness, unspecified: Secondary | ICD-10-CM | POA: Diagnosis not present

## 2021-09-24 DIAGNOSIS — R69 Illness, unspecified: Secondary | ICD-10-CM | POA: Diagnosis not present

## 2021-09-25 DIAGNOSIS — R69 Illness, unspecified: Secondary | ICD-10-CM | POA: Diagnosis not present

## 2021-09-26 DIAGNOSIS — R69 Illness, unspecified: Secondary | ICD-10-CM | POA: Diagnosis not present

## 2021-09-27 DIAGNOSIS — R69 Illness, unspecified: Secondary | ICD-10-CM | POA: Diagnosis not present

## 2021-09-28 DIAGNOSIS — E876 Hypokalemia: Secondary | ICD-10-CM | POA: Diagnosis not present

## 2021-09-28 DIAGNOSIS — E785 Hyperlipidemia, unspecified: Secondary | ICD-10-CM | POA: Diagnosis not present

## 2021-09-28 DIAGNOSIS — G629 Polyneuropathy, unspecified: Secondary | ICD-10-CM | POA: Diagnosis not present

## 2021-09-28 DIAGNOSIS — L603 Nail dystrophy: Secondary | ICD-10-CM | POA: Diagnosis not present

## 2021-09-28 DIAGNOSIS — R69 Illness, unspecified: Secondary | ICD-10-CM | POA: Diagnosis not present

## 2021-09-28 DIAGNOSIS — I739 Peripheral vascular disease, unspecified: Secondary | ICD-10-CM | POA: Diagnosis not present

## 2021-09-28 DIAGNOSIS — I82409 Acute embolism and thrombosis of unspecified deep veins of unspecified lower extremity: Secondary | ICD-10-CM | POA: Diagnosis not present

## 2021-09-28 DIAGNOSIS — E559 Vitamin D deficiency, unspecified: Secondary | ICD-10-CM | POA: Diagnosis not present

## 2021-09-28 DIAGNOSIS — N814 Uterovaginal prolapse, unspecified: Secondary | ICD-10-CM | POA: Diagnosis not present

## 2021-09-28 DIAGNOSIS — H9190 Unspecified hearing loss, unspecified ear: Secondary | ICD-10-CM | POA: Diagnosis not present

## 2021-09-29 DIAGNOSIS — R69 Illness, unspecified: Secondary | ICD-10-CM | POA: Diagnosis not present

## 2021-09-30 DIAGNOSIS — R69 Illness, unspecified: Secondary | ICD-10-CM | POA: Diagnosis not present

## 2021-10-01 DIAGNOSIS — R69 Illness, unspecified: Secondary | ICD-10-CM | POA: Diagnosis not present

## 2021-10-02 DIAGNOSIS — R69 Illness, unspecified: Secondary | ICD-10-CM | POA: Diagnosis not present

## 2021-10-03 DIAGNOSIS — R69 Illness, unspecified: Secondary | ICD-10-CM | POA: Diagnosis not present

## 2021-10-04 DIAGNOSIS — R69 Illness, unspecified: Secondary | ICD-10-CM | POA: Diagnosis not present

## 2021-10-05 DIAGNOSIS — R69 Illness, unspecified: Secondary | ICD-10-CM | POA: Diagnosis not present

## 2021-10-06 DIAGNOSIS — D518 Other vitamin B12 deficiency anemias: Secondary | ICD-10-CM | POA: Diagnosis not present

## 2021-10-06 DIAGNOSIS — E559 Vitamin D deficiency, unspecified: Secondary | ICD-10-CM | POA: Diagnosis not present

## 2021-10-06 DIAGNOSIS — Z79899 Other long term (current) drug therapy: Secondary | ICD-10-CM | POA: Diagnosis not present

## 2021-10-06 DIAGNOSIS — R69 Illness, unspecified: Secondary | ICD-10-CM | POA: Diagnosis not present

## 2021-10-06 DIAGNOSIS — E7849 Other hyperlipidemia: Secondary | ICD-10-CM | POA: Diagnosis not present

## 2021-10-06 DIAGNOSIS — E038 Other specified hypothyroidism: Secondary | ICD-10-CM | POA: Diagnosis not present

## 2021-10-07 DIAGNOSIS — R69 Illness, unspecified: Secondary | ICD-10-CM | POA: Diagnosis not present

## 2021-10-08 DIAGNOSIS — R69 Illness, unspecified: Secondary | ICD-10-CM | POA: Diagnosis not present

## 2021-10-09 DIAGNOSIS — R69 Illness, unspecified: Secondary | ICD-10-CM | POA: Diagnosis not present

## 2021-10-09 DIAGNOSIS — F5101 Primary insomnia: Secondary | ICD-10-CM | POA: Diagnosis not present

## 2021-10-09 DIAGNOSIS — R443 Hallucinations, unspecified: Secondary | ICD-10-CM | POA: Diagnosis not present

## 2021-10-09 DIAGNOSIS — I10 Essential (primary) hypertension: Secondary | ICD-10-CM | POA: Diagnosis not present

## 2021-10-09 DIAGNOSIS — F419 Anxiety disorder, unspecified: Secondary | ICD-10-CM | POA: Diagnosis not present

## 2021-10-10 DIAGNOSIS — R69 Illness, unspecified: Secondary | ICD-10-CM | POA: Diagnosis not present

## 2021-10-11 DIAGNOSIS — R69 Illness, unspecified: Secondary | ICD-10-CM | POA: Diagnosis not present

## 2021-10-12 DIAGNOSIS — R69 Illness, unspecified: Secondary | ICD-10-CM | POA: Diagnosis not present

## 2021-10-13 DIAGNOSIS — R69 Illness, unspecified: Secondary | ICD-10-CM | POA: Diagnosis not present

## 2021-10-14 DIAGNOSIS — D518 Other vitamin B12 deficiency anemias: Secondary | ICD-10-CM | POA: Diagnosis not present

## 2021-10-14 DIAGNOSIS — E785 Hyperlipidemia, unspecified: Secondary | ICD-10-CM | POA: Diagnosis not present

## 2021-10-14 DIAGNOSIS — E559 Vitamin D deficiency, unspecified: Secondary | ICD-10-CM | POA: Diagnosis not present

## 2021-10-14 DIAGNOSIS — R69 Illness, unspecified: Secondary | ICD-10-CM | POA: Diagnosis not present

## 2021-10-14 DIAGNOSIS — R296 Repeated falls: Secondary | ICD-10-CM | POA: Diagnosis not present

## 2021-10-14 DIAGNOSIS — E119 Type 2 diabetes mellitus without complications: Secondary | ICD-10-CM | POA: Diagnosis not present

## 2021-10-14 DIAGNOSIS — E038 Other specified hypothyroidism: Secondary | ICD-10-CM | POA: Diagnosis not present

## 2021-10-14 DIAGNOSIS — I10 Essential (primary) hypertension: Secondary | ICD-10-CM | POA: Diagnosis not present

## 2021-10-15 DIAGNOSIS — R69 Illness, unspecified: Secondary | ICD-10-CM | POA: Diagnosis not present

## 2021-10-16 DIAGNOSIS — R69 Illness, unspecified: Secondary | ICD-10-CM | POA: Diagnosis not present

## 2021-10-17 DIAGNOSIS — R69 Illness, unspecified: Secondary | ICD-10-CM | POA: Diagnosis not present

## 2021-10-18 DIAGNOSIS — R69 Illness, unspecified: Secondary | ICD-10-CM | POA: Diagnosis not present

## 2021-10-19 DIAGNOSIS — R69 Illness, unspecified: Secondary | ICD-10-CM | POA: Diagnosis not present

## 2021-10-20 DIAGNOSIS — R69 Illness, unspecified: Secondary | ICD-10-CM | POA: Diagnosis not present

## 2021-10-21 DIAGNOSIS — R69 Illness, unspecified: Secondary | ICD-10-CM | POA: Diagnosis not present

## 2021-10-22 DIAGNOSIS — R69 Illness, unspecified: Secondary | ICD-10-CM | POA: Diagnosis not present

## 2021-10-23 DIAGNOSIS — R69 Illness, unspecified: Secondary | ICD-10-CM | POA: Diagnosis not present

## 2021-10-24 DIAGNOSIS — R69 Illness, unspecified: Secondary | ICD-10-CM | POA: Diagnosis not present

## 2021-10-25 DIAGNOSIS — R69 Illness, unspecified: Secondary | ICD-10-CM | POA: Diagnosis not present

## 2021-10-26 DIAGNOSIS — R69 Illness, unspecified: Secondary | ICD-10-CM | POA: Diagnosis not present

## 2021-10-27 DIAGNOSIS — R69 Illness, unspecified: Secondary | ICD-10-CM | POA: Diagnosis not present

## 2021-10-28 DIAGNOSIS — R69 Illness, unspecified: Secondary | ICD-10-CM | POA: Diagnosis not present

## 2021-10-29 DIAGNOSIS — R69 Illness, unspecified: Secondary | ICD-10-CM | POA: Diagnosis not present

## 2021-10-30 DIAGNOSIS — R69 Illness, unspecified: Secondary | ICD-10-CM | POA: Diagnosis not present

## 2021-10-31 DIAGNOSIS — R69 Illness, unspecified: Secondary | ICD-10-CM | POA: Diagnosis not present

## 2021-11-01 DIAGNOSIS — R69 Illness, unspecified: Secondary | ICD-10-CM | POA: Diagnosis not present

## 2021-11-02 DIAGNOSIS — F5101 Primary insomnia: Secondary | ICD-10-CM | POA: Diagnosis not present

## 2021-11-02 DIAGNOSIS — F03918 Unspecified dementia, unspecified severity, with other behavioral disturbance: Secondary | ICD-10-CM | POA: Diagnosis not present

## 2021-11-02 DIAGNOSIS — R443 Hallucinations, unspecified: Secondary | ICD-10-CM | POA: Diagnosis not present

## 2021-11-02 DIAGNOSIS — R69 Illness, unspecified: Secondary | ICD-10-CM | POA: Diagnosis not present

## 2021-11-02 DIAGNOSIS — F419 Anxiety disorder, unspecified: Secondary | ICD-10-CM | POA: Diagnosis not present

## 2021-11-03 DIAGNOSIS — R69 Illness, unspecified: Secondary | ICD-10-CM | POA: Diagnosis not present

## 2021-11-04 DIAGNOSIS — R69 Illness, unspecified: Secondary | ICD-10-CM | POA: Diagnosis not present

## 2021-11-05 DIAGNOSIS — R69 Illness, unspecified: Secondary | ICD-10-CM | POA: Diagnosis not present

## 2021-11-06 DIAGNOSIS — R69 Illness, unspecified: Secondary | ICD-10-CM | POA: Diagnosis not present

## 2021-11-07 DIAGNOSIS — R69 Illness, unspecified: Secondary | ICD-10-CM | POA: Diagnosis not present

## 2021-11-07 DIAGNOSIS — I10 Essential (primary) hypertension: Secondary | ICD-10-CM | POA: Diagnosis not present

## 2021-11-08 DIAGNOSIS — R69 Illness, unspecified: Secondary | ICD-10-CM | POA: Diagnosis not present

## 2021-11-09 DIAGNOSIS — R69 Illness, unspecified: Secondary | ICD-10-CM | POA: Diagnosis not present

## 2021-11-10 DIAGNOSIS — D518 Other vitamin B12 deficiency anemias: Secondary | ICD-10-CM | POA: Diagnosis not present

## 2021-11-10 DIAGNOSIS — R69 Illness, unspecified: Secondary | ICD-10-CM | POA: Diagnosis not present

## 2021-11-10 DIAGNOSIS — E119 Type 2 diabetes mellitus without complications: Secondary | ICD-10-CM | POA: Diagnosis not present

## 2021-11-10 DIAGNOSIS — E559 Vitamin D deficiency, unspecified: Secondary | ICD-10-CM | POA: Diagnosis not present

## 2021-11-10 DIAGNOSIS — E038 Other specified hypothyroidism: Secondary | ICD-10-CM | POA: Diagnosis not present

## 2021-11-10 DIAGNOSIS — E785 Hyperlipidemia, unspecified: Secondary | ICD-10-CM | POA: Diagnosis not present

## 2021-11-10 DIAGNOSIS — I10 Essential (primary) hypertension: Secondary | ICD-10-CM | POA: Diagnosis not present

## 2021-11-11 DIAGNOSIS — R69 Illness, unspecified: Secondary | ICD-10-CM | POA: Diagnosis not present

## 2021-11-12 DIAGNOSIS — R69 Illness, unspecified: Secondary | ICD-10-CM | POA: Diagnosis not present

## 2021-11-13 DIAGNOSIS — R69 Illness, unspecified: Secondary | ICD-10-CM | POA: Diagnosis not present

## 2021-11-14 DIAGNOSIS — E785 Hyperlipidemia, unspecified: Secondary | ICD-10-CM | POA: Diagnosis not present

## 2021-11-14 DIAGNOSIS — R69 Illness, unspecified: Secondary | ICD-10-CM | POA: Diagnosis not present

## 2021-11-14 DIAGNOSIS — R296 Repeated falls: Secondary | ICD-10-CM | POA: Diagnosis not present

## 2021-11-14 DIAGNOSIS — R4189 Other symptoms and signs involving cognitive functions and awareness: Secondary | ICD-10-CM | POA: Diagnosis not present

## 2021-11-14 DIAGNOSIS — H9190 Unspecified hearing loss, unspecified ear: Secondary | ICD-10-CM | POA: Diagnosis not present

## 2021-11-14 DIAGNOSIS — N814 Uterovaginal prolapse, unspecified: Secondary | ICD-10-CM | POA: Diagnosis not present

## 2021-11-14 DIAGNOSIS — E876 Hypokalemia: Secondary | ICD-10-CM | POA: Diagnosis not present

## 2021-11-14 DIAGNOSIS — G629 Polyneuropathy, unspecified: Secondary | ICD-10-CM | POA: Diagnosis not present

## 2021-11-14 DIAGNOSIS — I739 Peripheral vascular disease, unspecified: Secondary | ICD-10-CM | POA: Diagnosis not present

## 2021-11-15 DIAGNOSIS — R69 Illness, unspecified: Secondary | ICD-10-CM | POA: Diagnosis not present

## 2021-11-16 DIAGNOSIS — R69 Illness, unspecified: Secondary | ICD-10-CM | POA: Diagnosis not present

## 2021-11-17 DIAGNOSIS — R69 Illness, unspecified: Secondary | ICD-10-CM | POA: Diagnosis not present

## 2021-11-18 DIAGNOSIS — R69 Illness, unspecified: Secondary | ICD-10-CM | POA: Diagnosis not present

## 2021-11-19 DIAGNOSIS — R69 Illness, unspecified: Secondary | ICD-10-CM | POA: Diagnosis not present

## 2021-11-20 DIAGNOSIS — R69 Illness, unspecified: Secondary | ICD-10-CM | POA: Diagnosis not present

## 2021-11-21 DIAGNOSIS — R69 Illness, unspecified: Secondary | ICD-10-CM | POA: Diagnosis not present

## 2021-11-22 DIAGNOSIS — R69 Illness, unspecified: Secondary | ICD-10-CM | POA: Diagnosis not present

## 2021-11-23 DIAGNOSIS — R69 Illness, unspecified: Secondary | ICD-10-CM | POA: Diagnosis not present

## 2021-11-24 DIAGNOSIS — R69 Illness, unspecified: Secondary | ICD-10-CM | POA: Diagnosis not present

## 2021-11-25 DIAGNOSIS — R69 Illness, unspecified: Secondary | ICD-10-CM | POA: Diagnosis not present

## 2021-11-26 DIAGNOSIS — R69 Illness, unspecified: Secondary | ICD-10-CM | POA: Diagnosis not present

## 2021-11-27 DIAGNOSIS — R69 Illness, unspecified: Secondary | ICD-10-CM | POA: Diagnosis not present

## 2021-11-28 DIAGNOSIS — R69 Illness, unspecified: Secondary | ICD-10-CM | POA: Diagnosis not present

## 2021-11-29 DIAGNOSIS — R69 Illness, unspecified: Secondary | ICD-10-CM | POA: Diagnosis not present

## 2021-11-30 DIAGNOSIS — F5101 Primary insomnia: Secondary | ICD-10-CM | POA: Diagnosis not present

## 2021-11-30 DIAGNOSIS — F419 Anxiety disorder, unspecified: Secondary | ICD-10-CM | POA: Diagnosis not present

## 2021-11-30 DIAGNOSIS — F03918 Unspecified dementia, unspecified severity, with other behavioral disturbance: Secondary | ICD-10-CM | POA: Diagnosis not present

## 2021-11-30 DIAGNOSIS — R443 Hallucinations, unspecified: Secondary | ICD-10-CM | POA: Diagnosis not present

## 2021-11-30 DIAGNOSIS — R69 Illness, unspecified: Secondary | ICD-10-CM | POA: Diagnosis not present

## 2021-12-01 DIAGNOSIS — R69 Illness, unspecified: Secondary | ICD-10-CM | POA: Diagnosis not present

## 2021-12-02 DIAGNOSIS — R69 Illness, unspecified: Secondary | ICD-10-CM | POA: Diagnosis not present

## 2021-12-03 DIAGNOSIS — R69 Illness, unspecified: Secondary | ICD-10-CM | POA: Diagnosis not present

## 2021-12-04 DIAGNOSIS — R69 Illness, unspecified: Secondary | ICD-10-CM | POA: Diagnosis not present

## 2021-12-05 DIAGNOSIS — I10 Essential (primary) hypertension: Secondary | ICD-10-CM | POA: Diagnosis not present

## 2021-12-05 DIAGNOSIS — R69 Illness, unspecified: Secondary | ICD-10-CM | POA: Diagnosis not present

## 2021-12-06 DIAGNOSIS — R69 Illness, unspecified: Secondary | ICD-10-CM | POA: Diagnosis not present

## 2021-12-07 DIAGNOSIS — J302 Other seasonal allergic rhinitis: Secondary | ICD-10-CM | POA: Diagnosis not present

## 2021-12-07 DIAGNOSIS — R69 Illness, unspecified: Secondary | ICD-10-CM | POA: Diagnosis not present

## 2021-12-07 DIAGNOSIS — Z79899 Other long term (current) drug therapy: Secondary | ICD-10-CM | POA: Diagnosis not present

## 2021-12-07 DIAGNOSIS — R0981 Nasal congestion: Secondary | ICD-10-CM | POA: Diagnosis not present

## 2021-12-08 DIAGNOSIS — R69 Illness, unspecified: Secondary | ICD-10-CM | POA: Diagnosis not present

## 2021-12-09 DIAGNOSIS — R69 Illness, unspecified: Secondary | ICD-10-CM | POA: Diagnosis not present

## 2021-12-10 DIAGNOSIS — R69 Illness, unspecified: Secondary | ICD-10-CM | POA: Diagnosis not present

## 2021-12-11 DIAGNOSIS — R69 Illness, unspecified: Secondary | ICD-10-CM | POA: Diagnosis not present

## 2021-12-12 DIAGNOSIS — R296 Repeated falls: Secondary | ICD-10-CM | POA: Diagnosis not present

## 2021-12-12 DIAGNOSIS — I739 Peripheral vascular disease, unspecified: Secondary | ICD-10-CM | POA: Diagnosis not present

## 2021-12-12 DIAGNOSIS — R69 Illness, unspecified: Secondary | ICD-10-CM | POA: Diagnosis not present

## 2021-12-12 DIAGNOSIS — M79604 Pain in right leg: Secondary | ICD-10-CM | POA: Diagnosis not present

## 2021-12-12 DIAGNOSIS — E876 Hypokalemia: Secondary | ICD-10-CM | POA: Diagnosis not present

## 2021-12-12 DIAGNOSIS — E559 Vitamin D deficiency, unspecified: Secondary | ICD-10-CM | POA: Diagnosis not present

## 2021-12-12 DIAGNOSIS — I517 Cardiomegaly: Secondary | ICD-10-CM | POA: Diagnosis not present

## 2021-12-12 DIAGNOSIS — H6121 Impacted cerumen, right ear: Secondary | ICD-10-CM | POA: Diagnosis not present

## 2021-12-12 DIAGNOSIS — J302 Other seasonal allergic rhinitis: Secondary | ICD-10-CM | POA: Diagnosis not present

## 2021-12-12 DIAGNOSIS — G629 Polyneuropathy, unspecified: Secondary | ICD-10-CM | POA: Diagnosis not present

## 2021-12-12 DIAGNOSIS — R0981 Nasal congestion: Secondary | ICD-10-CM | POA: Diagnosis not present

## 2021-12-13 DIAGNOSIS — R69 Illness, unspecified: Secondary | ICD-10-CM | POA: Diagnosis not present

## 2021-12-14 DIAGNOSIS — E038 Other specified hypothyroidism: Secondary | ICD-10-CM | POA: Diagnosis not present

## 2021-12-14 DIAGNOSIS — R69 Illness, unspecified: Secondary | ICD-10-CM | POA: Diagnosis not present

## 2021-12-14 DIAGNOSIS — E119 Type 2 diabetes mellitus without complications: Secondary | ICD-10-CM | POA: Diagnosis not present

## 2021-12-14 DIAGNOSIS — I10 Essential (primary) hypertension: Secondary | ICD-10-CM | POA: Diagnosis not present

## 2021-12-14 DIAGNOSIS — D518 Other vitamin B12 deficiency anemias: Secondary | ICD-10-CM | POA: Diagnosis not present

## 2021-12-14 DIAGNOSIS — E785 Hyperlipidemia, unspecified: Secondary | ICD-10-CM | POA: Diagnosis not present

## 2021-12-14 DIAGNOSIS — E559 Vitamin D deficiency, unspecified: Secondary | ICD-10-CM | POA: Diagnosis not present

## 2021-12-15 DIAGNOSIS — R69 Illness, unspecified: Secondary | ICD-10-CM | POA: Diagnosis not present

## 2021-12-16 DIAGNOSIS — R69 Illness, unspecified: Secondary | ICD-10-CM | POA: Diagnosis not present

## 2021-12-17 DIAGNOSIS — R69 Illness, unspecified: Secondary | ICD-10-CM | POA: Diagnosis not present

## 2021-12-18 DIAGNOSIS — R69 Illness, unspecified: Secondary | ICD-10-CM | POA: Diagnosis not present

## 2021-12-19 DIAGNOSIS — R69 Illness, unspecified: Secondary | ICD-10-CM | POA: Diagnosis not present

## 2021-12-20 DIAGNOSIS — R69 Illness, unspecified: Secondary | ICD-10-CM | POA: Diagnosis not present

## 2021-12-20 DIAGNOSIS — L603 Nail dystrophy: Secondary | ICD-10-CM | POA: Diagnosis not present

## 2021-12-21 DIAGNOSIS — R69 Illness, unspecified: Secondary | ICD-10-CM | POA: Diagnosis not present

## 2021-12-22 DIAGNOSIS — R69 Illness, unspecified: Secondary | ICD-10-CM | POA: Diagnosis not present

## 2021-12-23 DIAGNOSIS — R69 Illness, unspecified: Secondary | ICD-10-CM | POA: Diagnosis not present

## 2021-12-24 DIAGNOSIS — R69 Illness, unspecified: Secondary | ICD-10-CM | POA: Diagnosis not present

## 2021-12-25 DIAGNOSIS — R69 Illness, unspecified: Secondary | ICD-10-CM | POA: Diagnosis not present

## 2021-12-26 DIAGNOSIS — R69 Illness, unspecified: Secondary | ICD-10-CM | POA: Diagnosis not present

## 2021-12-27 DIAGNOSIS — R69 Illness, unspecified: Secondary | ICD-10-CM | POA: Diagnosis not present

## 2021-12-28 DIAGNOSIS — R69 Illness, unspecified: Secondary | ICD-10-CM | POA: Diagnosis not present

## 2021-12-29 DIAGNOSIS — R69 Illness, unspecified: Secondary | ICD-10-CM | POA: Diagnosis not present

## 2021-12-30 DIAGNOSIS — R69 Illness, unspecified: Secondary | ICD-10-CM | POA: Diagnosis not present

## 2021-12-31 DIAGNOSIS — R69 Illness, unspecified: Secondary | ICD-10-CM | POA: Diagnosis not present

## 2022-01-01 DIAGNOSIS — R69 Illness, unspecified: Secondary | ICD-10-CM | POA: Diagnosis not present

## 2022-01-02 DIAGNOSIS — R69 Illness, unspecified: Secondary | ICD-10-CM | POA: Diagnosis not present

## 2022-01-03 DIAGNOSIS — R69 Illness, unspecified: Secondary | ICD-10-CM | POA: Diagnosis not present

## 2022-01-04 DIAGNOSIS — F5101 Primary insomnia: Secondary | ICD-10-CM | POA: Diagnosis not present

## 2022-01-04 DIAGNOSIS — F419 Anxiety disorder, unspecified: Secondary | ICD-10-CM | POA: Diagnosis not present

## 2022-01-04 DIAGNOSIS — F03918 Unspecified dementia, unspecified severity, with other behavioral disturbance: Secondary | ICD-10-CM | POA: Diagnosis not present

## 2022-01-04 DIAGNOSIS — R443 Hallucinations, unspecified: Secondary | ICD-10-CM | POA: Diagnosis not present

## 2022-01-04 DIAGNOSIS — R69 Illness, unspecified: Secondary | ICD-10-CM | POA: Diagnosis not present

## 2022-01-05 DIAGNOSIS — I10 Essential (primary) hypertension: Secondary | ICD-10-CM | POA: Diagnosis not present

## 2022-01-05 DIAGNOSIS — R69 Illness, unspecified: Secondary | ICD-10-CM | POA: Diagnosis not present

## 2022-01-06 DIAGNOSIS — R69 Illness, unspecified: Secondary | ICD-10-CM | POA: Diagnosis not present

## 2022-01-07 DIAGNOSIS — R69 Illness, unspecified: Secondary | ICD-10-CM | POA: Diagnosis not present

## 2022-01-08 DIAGNOSIS — R69 Illness, unspecified: Secondary | ICD-10-CM | POA: Diagnosis not present

## 2022-01-09 DIAGNOSIS — I517 Cardiomegaly: Secondary | ICD-10-CM | POA: Diagnosis not present

## 2022-01-09 DIAGNOSIS — G629 Polyneuropathy, unspecified: Secondary | ICD-10-CM | POA: Diagnosis not present

## 2022-01-09 DIAGNOSIS — J302 Other seasonal allergic rhinitis: Secondary | ICD-10-CM | POA: Diagnosis not present

## 2022-01-09 DIAGNOSIS — E559 Vitamin D deficiency, unspecified: Secondary | ICD-10-CM | POA: Diagnosis not present

## 2022-01-09 DIAGNOSIS — I739 Peripheral vascular disease, unspecified: Secondary | ICD-10-CM | POA: Diagnosis not present

## 2022-01-09 DIAGNOSIS — H9190 Unspecified hearing loss, unspecified ear: Secondary | ICD-10-CM | POA: Diagnosis not present

## 2022-01-09 DIAGNOSIS — R4189 Other symptoms and signs involving cognitive functions and awareness: Secondary | ICD-10-CM | POA: Diagnosis not present

## 2022-01-09 DIAGNOSIS — R69 Illness, unspecified: Secondary | ICD-10-CM | POA: Diagnosis not present

## 2022-01-09 DIAGNOSIS — E785 Hyperlipidemia, unspecified: Secondary | ICD-10-CM | POA: Diagnosis not present

## 2022-01-10 DIAGNOSIS — R69 Illness, unspecified: Secondary | ICD-10-CM | POA: Diagnosis not present

## 2022-01-11 DIAGNOSIS — E7849 Other hyperlipidemia: Secondary | ICD-10-CM | POA: Diagnosis not present

## 2022-01-11 DIAGNOSIS — E559 Vitamin D deficiency, unspecified: Secondary | ICD-10-CM | POA: Diagnosis not present

## 2022-01-11 DIAGNOSIS — E038 Other specified hypothyroidism: Secondary | ICD-10-CM | POA: Diagnosis not present

## 2022-01-11 DIAGNOSIS — R69 Illness, unspecified: Secondary | ICD-10-CM | POA: Diagnosis not present

## 2022-01-11 DIAGNOSIS — D518 Other vitamin B12 deficiency anemias: Secondary | ICD-10-CM | POA: Diagnosis not present

## 2022-01-11 DIAGNOSIS — Z79899 Other long term (current) drug therapy: Secondary | ICD-10-CM | POA: Diagnosis not present

## 2022-01-12 DIAGNOSIS — R69 Illness, unspecified: Secondary | ICD-10-CM | POA: Diagnosis not present

## 2022-01-13 DIAGNOSIS — R69 Illness, unspecified: Secondary | ICD-10-CM | POA: Diagnosis not present

## 2022-01-14 DIAGNOSIS — R69 Illness, unspecified: Secondary | ICD-10-CM | POA: Diagnosis not present

## 2022-01-15 DIAGNOSIS — R69 Illness, unspecified: Secondary | ICD-10-CM | POA: Diagnosis not present

## 2022-01-16 DIAGNOSIS — R69 Illness, unspecified: Secondary | ICD-10-CM | POA: Diagnosis not present

## 2022-01-17 DIAGNOSIS — R69 Illness, unspecified: Secondary | ICD-10-CM | POA: Diagnosis not present

## 2022-01-18 DIAGNOSIS — R69 Illness, unspecified: Secondary | ICD-10-CM | POA: Diagnosis not present

## 2022-01-19 DIAGNOSIS — R69 Illness, unspecified: Secondary | ICD-10-CM | POA: Diagnosis not present

## 2022-01-20 DIAGNOSIS — E038 Other specified hypothyroidism: Secondary | ICD-10-CM | POA: Diagnosis not present

## 2022-01-20 DIAGNOSIS — E119 Type 2 diabetes mellitus without complications: Secondary | ICD-10-CM | POA: Diagnosis not present

## 2022-01-20 DIAGNOSIS — D518 Other vitamin B12 deficiency anemias: Secondary | ICD-10-CM | POA: Diagnosis not present

## 2022-01-20 DIAGNOSIS — E559 Vitamin D deficiency, unspecified: Secondary | ICD-10-CM | POA: Diagnosis not present

## 2022-01-20 DIAGNOSIS — I10 Essential (primary) hypertension: Secondary | ICD-10-CM | POA: Diagnosis not present

## 2022-01-20 DIAGNOSIS — E785 Hyperlipidemia, unspecified: Secondary | ICD-10-CM | POA: Diagnosis not present

## 2022-01-20 DIAGNOSIS — R69 Illness, unspecified: Secondary | ICD-10-CM | POA: Diagnosis not present

## 2022-01-21 DIAGNOSIS — R69 Illness, unspecified: Secondary | ICD-10-CM | POA: Diagnosis not present

## 2022-01-22 DIAGNOSIS — R69 Illness, unspecified: Secondary | ICD-10-CM | POA: Diagnosis not present

## 2022-01-23 DIAGNOSIS — R600 Localized edema: Secondary | ICD-10-CM | POA: Diagnosis not present

## 2022-01-23 DIAGNOSIS — L03115 Cellulitis of right lower limb: Secondary | ICD-10-CM | POA: Diagnosis not present

## 2022-01-23 DIAGNOSIS — L03116 Cellulitis of left lower limb: Secondary | ICD-10-CM | POA: Diagnosis not present

## 2022-01-23 DIAGNOSIS — R69 Illness, unspecified: Secondary | ICD-10-CM | POA: Diagnosis not present

## 2022-01-24 DIAGNOSIS — R69 Illness, unspecified: Secondary | ICD-10-CM | POA: Diagnosis not present

## 2022-01-25 DIAGNOSIS — R69 Illness, unspecified: Secondary | ICD-10-CM | POA: Diagnosis not present

## 2022-01-26 DIAGNOSIS — R69 Illness, unspecified: Secondary | ICD-10-CM | POA: Diagnosis not present

## 2022-01-27 DIAGNOSIS — R69 Illness, unspecified: Secondary | ICD-10-CM | POA: Diagnosis not present

## 2022-01-28 DIAGNOSIS — R69 Illness, unspecified: Secondary | ICD-10-CM | POA: Diagnosis not present

## 2022-01-29 DIAGNOSIS — R69 Illness, unspecified: Secondary | ICD-10-CM | POA: Diagnosis not present

## 2022-01-30 DIAGNOSIS — R69 Illness, unspecified: Secondary | ICD-10-CM | POA: Diagnosis not present

## 2022-01-31 ENCOUNTER — Emergency Department (HOSPITAL_COMMUNITY): Payer: Medicare HMO

## 2022-01-31 ENCOUNTER — Encounter (HOSPITAL_COMMUNITY): Payer: Self-pay

## 2022-01-31 ENCOUNTER — Emergency Department (HOSPITAL_COMMUNITY)
Admission: EM | Admit: 2022-01-31 | Discharge: 2022-01-31 | Disposition: A | Discharge status: Skilled Nursing Facility | Payer: Medicare HMO | Attending: Emergency Medicine | Admitting: Emergency Medicine

## 2022-01-31 ENCOUNTER — Other Ambulatory Visit: Payer: Self-pay

## 2022-01-31 DIAGNOSIS — R69 Illness, unspecified: Secondary | ICD-10-CM | POA: Diagnosis not present

## 2022-01-31 DIAGNOSIS — S0990XA Unspecified injury of head, initial encounter: Secondary | ICD-10-CM | POA: Diagnosis not present

## 2022-01-31 DIAGNOSIS — S59911A Unspecified injury of right forearm, initial encounter: Secondary | ICD-10-CM | POA: Diagnosis not present

## 2022-01-31 DIAGNOSIS — F0390 Unspecified dementia without behavioral disturbance: Secondary | ICD-10-CM | POA: Diagnosis not present

## 2022-01-31 DIAGNOSIS — S199XXA Unspecified injury of neck, initial encounter: Secondary | ICD-10-CM | POA: Diagnosis not present

## 2022-01-31 DIAGNOSIS — Z043 Encounter for examination and observation following other accident: Secondary | ICD-10-CM | POA: Diagnosis not present

## 2022-01-31 DIAGNOSIS — R600 Localized edema: Secondary | ICD-10-CM | POA: Diagnosis not present

## 2022-01-31 DIAGNOSIS — T148XXA Other injury of unspecified body region, initial encounter: Secondary | ICD-10-CM

## 2022-01-31 DIAGNOSIS — S0001XA Abrasion of scalp, initial encounter: Secondary | ICD-10-CM | POA: Insufficient documentation

## 2022-01-31 DIAGNOSIS — M6250 Muscle wasting and atrophy, not elsewhere classified, unspecified site: Secondary | ICD-10-CM | POA: Diagnosis not present

## 2022-01-31 DIAGNOSIS — S0003XA Contusion of scalp, initial encounter: Secondary | ICD-10-CM | POA: Diagnosis not present

## 2022-01-31 DIAGNOSIS — I10 Essential (primary) hypertension: Secondary | ICD-10-CM | POA: Diagnosis not present

## 2022-01-31 DIAGNOSIS — W19XXXA Unspecified fall, initial encounter: Secondary | ICD-10-CM | POA: Diagnosis not present

## 2022-01-31 DIAGNOSIS — S51811A Laceration without foreign body of right forearm, initial encounter: Secondary | ICD-10-CM | POA: Insufficient documentation

## 2022-01-31 DIAGNOSIS — R601 Generalized edema: Secondary | ICD-10-CM | POA: Diagnosis not present

## 2022-01-31 DIAGNOSIS — R296 Repeated falls: Secondary | ICD-10-CM | POA: Diagnosis not present

## 2022-01-31 DIAGNOSIS — S41119A Laceration without foreign body of unspecified upper arm, initial encounter: Secondary | ICD-10-CM | POA: Diagnosis not present

## 2022-01-31 DIAGNOSIS — S0181XA Laceration without foreign body of other part of head, initial encounter: Secondary | ICD-10-CM | POA: Diagnosis not present

## 2022-01-31 DIAGNOSIS — R519 Headache, unspecified: Secondary | ICD-10-CM | POA: Diagnosis not present

## 2022-01-31 LAB — CBC WITH DIFFERENTIAL/PLATELET
Abs Immature Granulocytes: 0.02 10*3/uL (ref 0.00–0.07)
Basophils Absolute: 0.1 10*3/uL (ref 0.0–0.1)
Basophils Relative: 1 %
Eosinophils Absolute: 0.2 10*3/uL (ref 0.0–0.5)
Eosinophils Relative: 3 %
HCT: 38.8 % (ref 36.0–46.0)
Hemoglobin: 11.9 g/dL — ABNORMAL LOW (ref 12.0–15.0)
Immature Granulocytes: 0 %
Lymphocytes Relative: 17 %
Lymphs Abs: 1 10*3/uL (ref 0.7–4.0)
MCH: 29.4 pg (ref 26.0–34.0)
MCHC: 30.7 g/dL (ref 30.0–36.0)
MCV: 95.8 fL (ref 80.0–100.0)
Monocytes Absolute: 0.5 10*3/uL (ref 0.1–1.0)
Monocytes Relative: 8 %
Neutro Abs: 4.1 10*3/uL (ref 1.7–7.7)
Neutrophils Relative %: 71 %
Platelets: 186 10*3/uL (ref 150–400)
RBC: 4.05 MIL/uL (ref 3.87–5.11)
RDW: 13.7 % (ref 11.5–15.5)
WBC: 5.9 10*3/uL (ref 4.0–10.5)
nRBC: 0 % (ref 0.0–0.2)

## 2022-01-31 LAB — BASIC METABOLIC PANEL
Anion gap: 5 (ref 5–15)
BUN: 19 mg/dL (ref 8–23)
CO2: 24 mmol/L (ref 22–32)
Calcium: 9.5 mg/dL (ref 8.9–10.3)
Chloride: 109 mmol/L (ref 98–111)
Creatinine, Ser: 1.35 mg/dL — ABNORMAL HIGH (ref 0.44–1.00)
GFR, Estimated: 38 mL/min — ABNORMAL LOW (ref >60)
Glucose, Bld: 104 mg/dL — ABNORMAL HIGH (ref 70–99)
Potassium: 4.2 mmol/L (ref 3.5–5.1)
Sodium: 138 mmol/L (ref 135–145)

## 2022-01-31 LAB — BRAIN NATRIURETIC PEPTIDE: B Natriuretic Peptide: 214 pg/mL — ABNORMAL HIGH (ref 0.0–100.0)

## 2022-01-31 MED ORDER — FUROSEMIDE 20 MG PO TABS
20.0000 mg | ORAL_TABLET | Freq: Every day | ORAL | 0 refills | Status: DC
Start: 2022-02-01 — End: 2022-01-31

## 2022-01-31 MED ORDER — FUROSEMIDE 20 MG PO TABS: 20.0000 mg | ORAL_TABLET | Freq: Every day | ORAL | 0 refills | Status: DC

## 2022-01-31 MED ORDER — FUROSEMIDE 10 MG/ML IJ SOLN
20.0000 mg | Freq: Once | INTRAMUSCULAR | Status: AC
  Administered 2022-01-31: 20 mg via INTRAVENOUS
  Filled 2022-01-31: qty 2

## 2022-01-31 NOTE — ED Triage Notes (Signed)
Patient sent from [place] for fall this am. Noted with laceration to R FA, R hand and head. Patient is not on blood thinner. Fall mechanical in nature. Complaints of pain to laceration to head.

## 2022-01-31 NOTE — Discharge Instructions (Addendum)
As discussed, today's evaluation has been generally reassuring.  There is no evidence for broken bones, nor injuries to the nervous system.  However, there is some evidence for retained fluid, which is a known issue for your mother.  She is starting a medication for the next 4 days, after receiving a first dose here via IV.  It is important that she follow-up with her cardiology colleagues.  The office should contact you for follow-up but if you have not heard from them in the next day or 2, please call for consideration of an echocardiogram.  Chest XRay:IMPRESSION: There is moderate bilateral interstitial thickening. This may represent chronic interstitial scarring. In the acute setting, this may represent mild-to-moderate interstitial pulmonary edema.  Radiology results from today's studies are included below:  CT head: 1. No evidence of acute intracranial abnormality. 2. Right frontal scalp hematoma without calvarial fracture. 3. Nodular soft tissue thickening of the right external auditory canal with nodular soft tissue in the mastoid antrum. Findings appear improved relative to maxillofacial CT from June 15, 2020 and could potentially represent malignancy and/or chronic infection. If not previously referred/evaluated, recommend ENT consultation and direct inspection. CT cervical spine: No evidence of acute fracture or traumatic malalignment.

## 2022-01-31 NOTE — ED Notes (Signed)
Verbal consent for MSE. No signature pad available.

## 2022-01-31 NOTE — ED Provider Notes (Signed)
[facility] Provider Note   CSN: [id number] Arrival date & time: 01/31/22  6301     History  Chief Complaint  Patient presents with   Christie Nelson is a 88 y.o. female.  HPI Patient presents via EMS after an unwitnessed fall that occurred at a nursing facility.  Patient is subsequently joined by her son who provides additional details. Patient has dementia, level 5 caveat.  Seemingly the patient has been dealing with worsening lower extremity swelling, wheezing, has seemingly been diagnosed with heart failure, but is not taking diuretics due to concern for renal dysfunction. In general, however, the patient has been in her usual state of health.  Per nursing report and EMS, the patient was found on the ground after noticed fall.  There is blood on the patient's forehead, and, right forearm.  The patient herself does not complain about pain, does follow simple commands appropriately.    Home Medications Prior to Admission medications   Medication Sig Start Date End Date Taking? Authorizing Provider  furosemide (LASIX) 20 MG tablet Take 1 tablet (20 mg total) by mouth daily for 4 days. 02/01/22 02/05/22 Yes Carmin Muskrat, MD  famotidine (PEPCID) 40 MG tablet Take 40 mg by mouth every morning. 12/17/19   [provider]  fluticasone (FLONASE) 50 MCG/ACT nasal spray Place 2 sprays into both nostrils daily. 12/17/19   [provider]  hydrochlorothiazide (MICROZIDE) 12.5 MG capsule Take 12.5 mg by mouth every morning. 12/17/19   [provider]  QUEtiapine (SEROQUEL) 25 MG tablet Take 25 mg by mouth at bedtime. 12/17/19   [provider]  sertraline (ZOLOFT) 50 MG tablet Take 1 tablet by mouth daily. 05/29/18   [provider]  traMADol (ULTRAM) 50 MG tablet Take 1 tablet (50 mg total) by mouth every 6 (six) hours as needed. 01/04/20   Fredia Sorrow, MD      Allergies    Patient has no known allergies.    Review  of Systems   Review of Systems  Unable to perform ROS: Dementia   Physical Exam Updated Vital Signs BP (!) 160/74   Pulse 76   Temp 98.2 F (36.8 C) (Oral)   Resp 16   Ht '5\' 2"'$  (1.575 m)   Wt 62.7 kg   SpO2 94%   BMI 25.28 kg/m  Physical Exam Vitals and nursing note reviewed.  Constitutional:      General: She is not in acute distress.    Appearance: She is well-developed. She is not toxic-appearing or diaphoretic.  HENT:     Head: Normocephalic.   Eyes:     Conjunctiva/sclera: Conjunctivae normal.  Cardiovascular:     Rate and Rhythm: Normal rate and regular rhythm.  Pulmonary:     Effort: Pulmonary effort is normal. No respiratory distress.     Breath sounds: Normal breath sounds. No stridor.  Abdominal:     General: There is no distension.  Musculoskeletal:     Cervical back: No spinous process tenderness or muscular tenderness.     Comments: Pelvis stable, patient flexes to each hip to command, has more pain in the right compared to left, no shortening of the legs. She moves all other extremities spontaneously, has no other complaints of pain.  Skin:    General: Skin is warm and dry.       Neurological:     Mental Status: She is alert.     Cranial Nerves: No cranial nerve  deficit.     Motor: Atrophy present. No tremor or abnormal muscle tone.  Psychiatric:        Mood and Affect: Mood normal.        Cognition and Memory: Cognition is impaired. Memory is impaired.    ED Results / Procedures / Treatments   Labs (all labs ordered are listed, but only abnormal results are displayed) Labs Reviewed  CBC WITH DIFFERENTIAL/PLATELET - Abnormal; Notable for the following components:      Result Value   Hemoglobin 11.9 (*)    All other components within normal limits  BRAIN NATRIURETIC PEPTIDE - Abnormal; Notable for the following components:   B Natriuretic Peptide 214.0 (*)    All other components within normal limits  BASIC METABOLIC PANEL - Abnormal; Notable  for the following components:   Glucose, Bld 104 (*)    Creatinine, Ser 1.35 (*)    GFR, Estimated 38 (*)    All other components within normal limits    EKG None  Radiology DG Chest 1 View  Result Date: 01/31/2022 CLINICAL DATA:  Fall today. EXAM: CHEST  1 VIEW COMPARISON:  None Available. FINDINGS: Cardiac silhouette and mediastinal contours within normal limits. Mild-to-moderate calcification within aortic arch. Moderate bilateral interstitial thickening. No pleural effusion or pneumothorax. No acute skeletal abnormality. IMPRESSION: There is moderate bilateral interstitial thickening. This may represent chronic interstitial scarring. In the acute setting, this may represent mild-to-moderate interstitial pulmonary edema. No comparison is available. Electronically Signed   By: Yvonne Kendall M.D.   On: 01/31/2022 09:42   DG Pelvis 1-2 Views  Result Date: 01/31/2022 CLINICAL DATA:  Status post fall. EXAM: PELVIS - 1-2 VIEW COMPARISON:  06/15/2020 FINDINGS: Postoperative changes from bilateral hip arthroplasty. Unchanged appearance of chronic right acetabular deformity and lucency. There are no signs of acute fracture or dislocation. IMPRESSION: 1. No acute findings. 2. Status post bilateral hip arthroplasty. Electronically Signed   By: Kerby Moors M.D.   On: 01/31/2022 09:44   CT Head Wo Contrast  Result Date: 01/31/2022 CLINICAL DATA:  Polytrauma, blunt; Polytrauma, blunt fall, frontal head trauma w lac EXAM: CT HEAD WITHOUT CONTRAST CT CERVICAL SPINE WITHOUT CONTRAST TECHNIQUE: Multidetector CT imaging of the head and cervical spine was performed following the standard protocol without intravenous contrast. Multiplanar CT image reconstructions of the cervical spine were also generated. RADIATION DOSE REDUCTION: This exam was performed according to the departmental dose-optimization program which includes automated exposure control, adjustment of the mA and/or kV according to patient size  and/or use of iterative reconstruction technique. COMPARISON:  CT cervical spine October 6, 21. FINDINGS: CT HEAD FINDINGS Brain: No evidence of acute infarction, hemorrhage, hydrocephalus, extra-axial collection or mass lesion/mass effect. Mild for age chronic microvascular disease and atrophy. Vascular: No hyperdense vessel identified. Skull: Right frontal scalp hematoma.  No acute fracture. Sinuses/Orbits: Visualized sinuses are clear. No acute orbital findings. Other: Nodular soft tissue thickening of the right external auditory canal with nodular soft tissue in the right mastoid antrum. CT CERVICAL SPINE FINDINGS Alignment: Mild retrolisthesis of C3 on C4 and mild anterolisthesis of C6 on C7, similar to prior. Skull base and vertebrae: Mild height loss of the visualized T2 vertebral body, similar to prior. Otherwise, no vertebral body height loss. No evidence of acute fracture. Soft tissues and spinal canal: No prevertebral fluid or swelling. No visible canal hematoma. Disc levels: Similar moderate to severe multilevel degenerative disc disease. Multilevel facet and uncovertebral hypertrophy with varying degrees of neural foraminal stenosis.  Upper chest: No consolidation the visualized lung apices. IMPRESSION: CT head: 1. No evidence of acute intracranial abnormality. 2. Right frontal scalp hematoma without calvarial fracture. 3. Nodular soft tissue thickening of the right external auditory canal with nodular soft tissue in the mastoid antrum. Findings appear improved relative to maxillofacial CT from June 15, 2020 and could potentially represent malignancy and/or chronic infection. If not previously referred/evaluated, recommend ENT consultation and direct inspection. CT cervical spine: No evidence of acute fracture or traumatic malalignment. Electronically Signed   By: Margaretha Sheffield M.D.   On: 01/31/2022 09:52   CT Cervical Spine Wo Contrast  Result Date: 01/31/2022 CLINICAL DATA:  Polytrauma,  blunt; Polytrauma, blunt fall, frontal head trauma w lac EXAM: CT HEAD WITHOUT CONTRAST CT CERVICAL SPINE WITHOUT CONTRAST TECHNIQUE: Multidetector CT imaging of the head and cervical spine was performed following the standard protocol without intravenous contrast. Multiplanar CT image reconstructions of the cervical spine were also generated. RADIATION DOSE REDUCTION: This exam was performed according to the departmental dose-optimization program which includes automated exposure control, adjustment of the mA and/or kV according to patient size and/or use of iterative reconstruction technique. COMPARISON:  CT cervical spine October 6, 21. FINDINGS: CT HEAD FINDINGS Brain: No evidence of acute infarction, hemorrhage, hydrocephalus, extra-axial collection or mass lesion/mass effect. Mild for age chronic microvascular disease and atrophy. Vascular: No hyperdense vessel identified. Skull: Right frontal scalp hematoma.  No acute fracture. Sinuses/Orbits: Visualized sinuses are clear. No acute orbital findings. Other: Nodular soft tissue thickening of the right external auditory canal with nodular soft tissue in the right mastoid antrum. CT CERVICAL SPINE FINDINGS Alignment: Mild retrolisthesis of C3 on C4 and mild anterolisthesis of C6 on C7, similar to prior. Skull base and vertebrae: Mild height loss of the visualized T2 vertebral body, similar to prior. Otherwise, no vertebral body height loss. No evidence of acute fracture. Soft tissues and spinal canal: No prevertebral fluid or swelling. No visible canal hematoma. Disc levels: Similar moderate to severe multilevel degenerative disc disease. Multilevel facet and uncovertebral hypertrophy with varying degrees of neural foraminal stenosis. Upper chest: No consolidation the visualized lung apices. IMPRESSION: CT head: 1. No evidence of acute intracranial abnormality. 2. Right frontal scalp hematoma without calvarial fracture. 3. Nodular soft tissue thickening of the  right external auditory canal with nodular soft tissue in the mastoid antrum. Findings appear improved relative to maxillofacial CT from June 15, 2020 and could potentially represent malignancy and/or chronic infection. If not previously referred/evaluated, recommend ENT consultation and direct inspection. CT cervical spine: No evidence of acute fracture or traumatic malalignment. Electronically Signed   By: Margaretha Sheffield M.D.   On: 01/31/2022 09:52    Procedures Procedures    Medications Ordered in ED Medications  furosemide (LASIX) injection 20 mg (has no administration in time range)    ED Course/ Medical Decision Making/ A&P This patient with a Hx of dementia, prior hip arthroplasty, wheezing lower extremity edema presents to the ED for concern of fall, multiple skin lesions, this involves an extensive number of treatment options, and is a complaint that carries with it a high risk of complications and morbidity.    The differential diagnosis includes intracranial abnormality, fractures, and given concern for wheezing and swelling, heart failure, electrolyte abnormalities   Social Determinants of Health:  Mention, age, nursing home residency  Additional history obtained:  Additional history and/or information obtained from as above son, EMS, notable for details in HPI   After the initial evaluation,  orders, including: Labs x-ray CT were initiated.   Patient placed on Cardiac and Pulse-Oximetry Monitors. The patient was maintained on a cardiac monitor.  The cardiac monitored showed an rhythm of 75 sinus normal The patient was also maintained on pulse oximetry. The readings were typically 100% room air normal   On repeat evaluation of the patient stayed the same  Lab Tests:  I personally interpreted labs.  The pertinent results include: Mild elevation in BNP, slight elevation in creatinine  Imaging Studies ordered:  I independently visualized and interpreted imaging  which showed no intracranial hemorrhage, no fracture of any area imaged. X-ray with some pulmonary congestion I agree with the radiologist interpretation  11:24 AM Patient in no distress, no oxygen requirement, no complaints.  On repeat evaluation she has clear lung sounds bilaterally, the son notes that he had previously heard some wheezing.  We lengthy conversation about all results, including generally reassuring radiographic studies, no fracture, no intracranial hemorrhage.  Labs consistent with known history of fluid retention, renal dysfunction, reflective of this was elevated BNP, slight elevation in creatinine.  We like conversation about pros and cons of diuretics given her renal dysfunction, and patient will receive Lasix x1, have this medication for the next 5 days in an effort to decrease some of fluid retention, after again discussing possibility of slight renal impact.  Patient will have outpatient echocardiogram as it does not appear as though she has had 1 on chart review, and son is amenable to this.  After wounds were repaired, without evidence for new neuro dysfunction, intracranial hemorrhage, fracture, distress, new oxygen requirement, patient discharged with close outpatient follow-up.   Final Clinical Impression(s) / ED Diagnoses Final diagnoses:  Fall, initial encounter  Lower extremity edema  Multiple skin tears    Rx / DC Orders ED Discharge Orders          Ordered    furosemide (LASIX) 20 MG tablet  Daily        01/31/22 1124    Ambulatory referral to Cardiology       Comments: If you have not heard from the Cardiology office within the next 72 hours please call [phone].   01/31/22 1124              Carmin Muskrat, MD 01/31/22 1124

## 2022-02-01 DIAGNOSIS — F03918 Unspecified dementia, unspecified severity, with other behavioral disturbance: Secondary | ICD-10-CM | POA: Diagnosis not present

## 2022-02-01 DIAGNOSIS — F419 Anxiety disorder, unspecified: Secondary | ICD-10-CM | POA: Diagnosis not present

## 2022-02-01 DIAGNOSIS — F5101 Primary insomnia: Secondary | ICD-10-CM | POA: Diagnosis not present

## 2022-02-01 DIAGNOSIS — R69 Illness, unspecified: Secondary | ICD-10-CM | POA: Diagnosis not present

## 2022-02-01 DIAGNOSIS — R443 Hallucinations, unspecified: Secondary | ICD-10-CM | POA: Diagnosis not present

## 2022-02-02 DIAGNOSIS — R69 Illness, unspecified: Secondary | ICD-10-CM | POA: Diagnosis not present

## 2022-02-03 DIAGNOSIS — R69 Illness, unspecified: Secondary | ICD-10-CM | POA: Diagnosis not present

## 2022-02-04 DIAGNOSIS — R69 Illness, unspecified: Secondary | ICD-10-CM | POA: Diagnosis not present

## 2022-02-04 DIAGNOSIS — I10 Essential (primary) hypertension: Secondary | ICD-10-CM | POA: Diagnosis not present

## 2022-02-05 DIAGNOSIS — R69 Illness, unspecified: Secondary | ICD-10-CM | POA: Diagnosis not present

## 2022-02-06 DIAGNOSIS — R4189 Other symptoms and signs involving cognitive functions and awareness: Secondary | ICD-10-CM | POA: Diagnosis not present

## 2022-02-06 DIAGNOSIS — H9190 Unspecified hearing loss, unspecified ear: Secondary | ICD-10-CM | POA: Diagnosis not present

## 2022-02-06 DIAGNOSIS — I10 Essential (primary) hypertension: Secondary | ICD-10-CM | POA: Diagnosis not present

## 2022-02-06 DIAGNOSIS — I739 Peripheral vascular disease, unspecified: Secondary | ICD-10-CM | POA: Diagnosis not present

## 2022-02-06 DIAGNOSIS — G629 Polyneuropathy, unspecified: Secondary | ICD-10-CM | POA: Diagnosis not present

## 2022-02-06 DIAGNOSIS — R300 Dysuria: Secondary | ICD-10-CM | POA: Diagnosis not present

## 2022-02-06 DIAGNOSIS — R296 Repeated falls: Secondary | ICD-10-CM | POA: Diagnosis not present

## 2022-02-06 DIAGNOSIS — E785 Hyperlipidemia, unspecified: Secondary | ICD-10-CM | POA: Diagnosis not present

## 2022-02-06 DIAGNOSIS — E559 Vitamin D deficiency, unspecified: Secondary | ICD-10-CM | POA: Diagnosis not present

## 2022-02-06 DIAGNOSIS — J302 Other seasonal allergic rhinitis: Secondary | ICD-10-CM | POA: Diagnosis not present

## 2022-02-06 DIAGNOSIS — R69 Illness, unspecified: Secondary | ICD-10-CM | POA: Diagnosis not present

## 2022-02-06 DIAGNOSIS — S0181XA Laceration without foreign body of other part of head, initial encounter: Secondary | ICD-10-CM | POA: Diagnosis not present

## 2022-02-07 DIAGNOSIS — R69 Illness, unspecified: Secondary | ICD-10-CM | POA: Diagnosis not present

## 2022-02-08 DIAGNOSIS — R69 Illness, unspecified: Secondary | ICD-10-CM | POA: Diagnosis not present

## 2022-02-09 DIAGNOSIS — R69 Illness, unspecified: Secondary | ICD-10-CM | POA: Diagnosis not present

## 2022-02-10 DIAGNOSIS — R69 Illness, unspecified: Secondary | ICD-10-CM | POA: Diagnosis not present

## 2022-02-11 DIAGNOSIS — R69 Illness, unspecified: Secondary | ICD-10-CM | POA: Diagnosis not present

## 2022-02-12 DIAGNOSIS — R69 Illness, unspecified: Secondary | ICD-10-CM | POA: Diagnosis not present

## 2022-02-13 DIAGNOSIS — R69 Illness, unspecified: Secondary | ICD-10-CM | POA: Diagnosis not present

## 2022-02-14 DIAGNOSIS — R69 Illness, unspecified: Secondary | ICD-10-CM | POA: Diagnosis not present

## 2022-02-15 DIAGNOSIS — R69 Illness, unspecified: Secondary | ICD-10-CM | POA: Diagnosis not present

## 2022-02-16 DIAGNOSIS — R69 Illness, unspecified: Secondary | ICD-10-CM | POA: Diagnosis not present

## 2022-02-17 DIAGNOSIS — R69 Illness, unspecified: Secondary | ICD-10-CM | POA: Diagnosis not present

## 2022-02-18 DIAGNOSIS — R69 Illness, unspecified: Secondary | ICD-10-CM | POA: Diagnosis not present

## 2022-02-19 DIAGNOSIS — R69 Illness, unspecified: Secondary | ICD-10-CM | POA: Diagnosis not present

## 2022-02-20 DIAGNOSIS — R69 Illness, unspecified: Secondary | ICD-10-CM | POA: Diagnosis not present

## 2022-02-20 DIAGNOSIS — R062 Wheezing: Secondary | ICD-10-CM | POA: Diagnosis not present

## 2022-02-21 DIAGNOSIS — R69 Illness, unspecified: Secondary | ICD-10-CM | POA: Diagnosis not present

## 2022-02-22 DIAGNOSIS — D518 Other vitamin B12 deficiency anemias: Secondary | ICD-10-CM | POA: Diagnosis not present

## 2022-02-22 DIAGNOSIS — R69 Illness, unspecified: Secondary | ICD-10-CM | POA: Diagnosis not present

## 2022-02-22 DIAGNOSIS — E038 Other specified hypothyroidism: Secondary | ICD-10-CM | POA: Diagnosis not present

## 2022-02-22 DIAGNOSIS — E782 Mixed hyperlipidemia: Secondary | ICD-10-CM | POA: Diagnosis not present

## 2022-02-22 DIAGNOSIS — E559 Vitamin D deficiency, unspecified: Secondary | ICD-10-CM | POA: Diagnosis not present

## 2022-02-22 DIAGNOSIS — I10 Essential (primary) hypertension: Secondary | ICD-10-CM | POA: Diagnosis not present

## 2022-02-22 DIAGNOSIS — E119 Type 2 diabetes mellitus without complications: Secondary | ICD-10-CM | POA: Diagnosis not present

## 2022-02-23 DIAGNOSIS — R69 Illness, unspecified: Secondary | ICD-10-CM | POA: Diagnosis not present

## 2022-02-24 DIAGNOSIS — R69 Illness, unspecified: Secondary | ICD-10-CM | POA: Diagnosis not present

## 2022-02-25 DIAGNOSIS — R69 Illness, unspecified: Secondary | ICD-10-CM | POA: Diagnosis not present

## 2022-02-26 DIAGNOSIS — R69 Illness, unspecified: Secondary | ICD-10-CM | POA: Diagnosis not present

## 2022-02-27 DIAGNOSIS — R69 Illness, unspecified: Secondary | ICD-10-CM | POA: Diagnosis not present

## 2022-02-28 DIAGNOSIS — R69 Illness, unspecified: Secondary | ICD-10-CM | POA: Diagnosis not present

## 2022-03-01 DIAGNOSIS — R443 Hallucinations, unspecified: Secondary | ICD-10-CM | POA: Diagnosis not present

## 2022-03-01 DIAGNOSIS — F5101 Primary insomnia: Secondary | ICD-10-CM | POA: Diagnosis not present

## 2022-03-01 DIAGNOSIS — R69 Illness, unspecified: Secondary | ICD-10-CM | POA: Diagnosis not present

## 2022-03-01 DIAGNOSIS — F419 Anxiety disorder, unspecified: Secondary | ICD-10-CM | POA: Diagnosis not present

## 2022-03-01 DIAGNOSIS — F03918 Unspecified dementia, unspecified severity, with other behavioral disturbance: Secondary | ICD-10-CM | POA: Diagnosis not present

## 2022-03-02 DIAGNOSIS — R69 Illness, unspecified: Secondary | ICD-10-CM | POA: Diagnosis not present

## 2022-03-03 DIAGNOSIS — R69 Illness, unspecified: Secondary | ICD-10-CM | POA: Diagnosis not present

## 2022-03-04 DIAGNOSIS — R69 Illness, unspecified: Secondary | ICD-10-CM | POA: Diagnosis not present

## 2022-03-05 DIAGNOSIS — R69 Illness, unspecified: Secondary | ICD-10-CM | POA: Diagnosis not present

## 2022-03-06 DIAGNOSIS — R69 Illness, unspecified: Secondary | ICD-10-CM | POA: Diagnosis not present

## 2022-03-06 DIAGNOSIS — H6122 Impacted cerumen, left ear: Secondary | ICD-10-CM | POA: Diagnosis not present

## 2022-03-06 DIAGNOSIS — H60331 Swimmer's ear, right ear: Secondary | ICD-10-CM | POA: Diagnosis not present

## 2022-03-07 DIAGNOSIS — I10 Essential (primary) hypertension: Secondary | ICD-10-CM | POA: Diagnosis not present

## 2022-03-07 DIAGNOSIS — R69 Illness, unspecified: Secondary | ICD-10-CM | POA: Diagnosis not present

## 2022-03-08 DIAGNOSIS — R69 Illness, unspecified: Secondary | ICD-10-CM | POA: Diagnosis not present

## 2022-03-09 DIAGNOSIS — R69 Illness, unspecified: Secondary | ICD-10-CM | POA: Diagnosis not present

## 2022-03-10 DIAGNOSIS — R69 Illness, unspecified: Secondary | ICD-10-CM | POA: Diagnosis not present

## 2022-03-11 DIAGNOSIS — R69 Illness, unspecified: Secondary | ICD-10-CM | POA: Diagnosis not present

## 2022-03-12 DIAGNOSIS — R69 Illness, unspecified: Secondary | ICD-10-CM | POA: Diagnosis not present

## 2022-03-13 DIAGNOSIS — R69 Illness, unspecified: Secondary | ICD-10-CM | POA: Diagnosis not present

## 2022-03-14 DIAGNOSIS — R69 Illness, unspecified: Secondary | ICD-10-CM | POA: Diagnosis not present

## 2022-03-15 DIAGNOSIS — R69 Illness, unspecified: Secondary | ICD-10-CM | POA: Diagnosis not present

## 2022-03-16 DIAGNOSIS — R69 Illness, unspecified: Secondary | ICD-10-CM | POA: Diagnosis not present

## 2022-03-17 DIAGNOSIS — R69 Illness, unspecified: Secondary | ICD-10-CM | POA: Diagnosis not present

## 2022-03-18 DIAGNOSIS — R69 Illness, unspecified: Secondary | ICD-10-CM | POA: Diagnosis not present

## 2022-03-19 DIAGNOSIS — R69 Illness, unspecified: Secondary | ICD-10-CM | POA: Diagnosis not present

## 2022-03-20 ENCOUNTER — Ambulatory Visit (INDEPENDENT_AMBULATORY_CARE_PROVIDER_SITE_OTHER): Payer: Medicare HMO | Admitting: Cardiology

## 2022-03-20 ENCOUNTER — Encounter: Payer: Self-pay | Admitting: Cardiology

## 2022-03-20 VITALS — BP 142/70 | HR 77 | Ht 66.0 in | Wt 150.8 lb

## 2022-03-20 DIAGNOSIS — I739 Peripheral vascular disease, unspecified: Secondary | ICD-10-CM | POA: Diagnosis not present

## 2022-03-20 DIAGNOSIS — E559 Vitamin D deficiency, unspecified: Secondary | ICD-10-CM | POA: Diagnosis not present

## 2022-03-20 DIAGNOSIS — E785 Hyperlipidemia, unspecified: Secondary | ICD-10-CM | POA: Diagnosis not present

## 2022-03-20 DIAGNOSIS — R0602 Shortness of breath: Secondary | ICD-10-CM

## 2022-03-20 DIAGNOSIS — I4891 Unspecified atrial fibrillation: Secondary | ICD-10-CM | POA: Diagnosis not present

## 2022-03-20 DIAGNOSIS — R600 Localized edema: Secondary | ICD-10-CM

## 2022-03-20 DIAGNOSIS — I10 Essential (primary) hypertension: Secondary | ICD-10-CM | POA: Diagnosis not present

## 2022-03-20 DIAGNOSIS — H9190 Unspecified hearing loss, unspecified ear: Secondary | ICD-10-CM | POA: Diagnosis not present

## 2022-03-20 DIAGNOSIS — R4189 Other symptoms and signs involving cognitive functions and awareness: Secondary | ICD-10-CM | POA: Diagnosis not present

## 2022-03-20 DIAGNOSIS — R69 Illness, unspecified: Secondary | ICD-10-CM | POA: Diagnosis not present

## 2022-03-20 DIAGNOSIS — G629 Polyneuropathy, unspecified: Secondary | ICD-10-CM | POA: Diagnosis not present

## 2022-03-20 DIAGNOSIS — J302 Other seasonal allergic rhinitis: Secondary | ICD-10-CM | POA: Diagnosis not present

## 2022-03-20 MED ORDER — FUROSEMIDE 20 MG PO TABS: ORAL_TABLET | ORAL | 6 refills | Status: DC

## 2022-03-20 MED ORDER — APIXABAN 5 MG PO TABS: 5.0000 mg | ORAL_TABLET | Freq: Two times a day (BID) | ORAL | 6 refills | Status: DC

## 2022-03-20 NOTE — Patient Instructions (Addendum)
Medication Instructions:  Begin Lasix '20mg'$  daily x 3 days, then only as needed for swelling thereafter. Begin Eliquis '5mg'$  twice a day   Continue all other medications.     Labwork: none  Testing/Procedures: Your physician has requested that you have an echocardiogram. Echocardiography is a painless test that uses sound waves to create images of your heart. It provides your doctor with information about the size and shape of your heart and how well your heart's chambers and valves are working. This procedure takes approximately one hour. There are no restrictions for this procedure. Office will contact with results via phone or letter.     Follow-Up: 4 months   Any Other Special Instructions Will Be Listed Below (If Applicable).   If you need a refill on your cardiac medications before your next appointment, please call your pharmacy.

## 2022-03-20 NOTE — Progress Notes (Signed)
Clinical Summary Ms. Eppinger is a 88 y.o.female seen as a new patient for the following medical problems.    1.LE edema - ongonig about 1.5 month - occasonal SOB, wheezing. Occasional SOb with lying flat. ` - BNP at recent ER visti 214.     2.Dementia    3. Afib - new diagnosis this clinic visit - difficult historian to assess palpitaitons.   SH: lives [facility].  Past Medical History:  Diagnosis Date   Arthritis    GERD (gastroesophageal reflux disease)    Hypertension    Prolactinoma (HCC)    Stroke (HCC)      No Known Allergies   Current Outpatient Medications  Medication Sig Dispense Refill   famotidine (PEPCID) 40 MG tablet Take 40 mg by mouth every morning.     fluticasone (FLONASE) 50 MCG/ACT nasal spray Place 2 sprays into both nostrils daily.     furosemide (LASIX) 20 MG tablet Take 1 tablet (20 mg total) by mouth daily for 4 days. 15 tablet 0   hydrochlorothiazide (MICROZIDE) 12.5 MG capsule Take 12.5 mg by mouth every morning.     QUEtiapine (SEROQUEL) 25 MG tablet Take 25 mg by mouth at bedtime.     sertraline (ZOLOFT) 50 MG tablet Take 1 tablet by mouth daily.     traMADol (ULTRAM) 50 MG tablet Take 1 tablet (50 mg total) by mouth every 6 (six) hours as needed. 15 tablet 0   No current facility-administered medications for this visit.     Past Surgical History:  Procedure Laterality Date   ABDOMINAL HYSTERECTOMY  yrs ago   CATARACT EXTRACTION W/ INTRAOCULAR LENS  IMPLANT, BILATERAL  2008   both eyes done   RECTOCELE REPAIR  yrs ago   right hip arthroplasty  2003   TONSILLECTOMY  age 56   TOTAL HIP ARTHROPLASTY  09/18/2011   Procedure: TOTAL HIP ARTHROPLASTY ANTERIOR APPROACH;  Surgeon: Mauri Pole;  Location: [facility];  Service: Orthopedics;  Laterality: Left;     No Known Allergies    No family history on file.   Social History Ms. Hughston reports that she quit smoking about 34 years ago. She has a 7.50 pack-year  smoking history. She has never used smokeless tobacco. Ms. Kyllo reports no history of alcohol use.   Review of Systems CONSTITUTIONAL: No weight loss, fever, chills, weakness or fatigue.  HEENT: Eyes: No visual loss, blurred vision, double vision or yellow sclerae.No hearing loss, sneezing, congestion, runny nose or sore throat.  SKIN: No rash or itching.  CARDIOVASCULAR: per hpi RESPIRATORY: per hpi  GASTROINTESTINAL: No anorexia, nausea, vomiting or diarrhea. No abdominal pain or blood.  GENITOURINARY: No burning on urination, no polyuria NEUROLOGICAL: No headache, dizziness, syncope, paralysis, ataxia, numbness or tingling in the extremities. No change in bowel or bladder control.  MUSCULOSKELETAL: No muscle, back pain, joint pain or stiffness.  LYMPHATICS: No enlarged nodes. No history of splenectomy.  PSYCHIATRIC: No history of depression or anxiety.  ENDOCRINOLOGIC: No reports of sweating, cold or heat intolerance. No polyuria or polydipsia.  Marland Kitchen   Physical Examination Today's Vitals   03/20/22 1039  BP: (!) 142/70  Pulse: 77  SpO2: 97%  Weight: 150 lb 12.8 oz (68.4 kg)  Height: '5\' 6"'$  (1.676 m)   Body mass index is 24.34 kg/m.  Gen: resting comfortably, no acute distress HEENT: no scleral icterus, pupils equal round and reactive, no palptable cervical adenopathy,  CV: irreg, no m/r/ gno jvd  Resp: faint crackles bilateral bases GI: abdomen is soft, non-tender, non-distended, normal bowel sounds, no hepatosplenomegaly MSK: extremities are warm, 1+ bilatearl LE edema Skin: warm, no rash Neuro:  no focal deficits Psych: appropriate affect     Assessment and Plan  1.Leg edema/SOB/probably CHF - check echo to assess cardiac function - volume overloaded, start lasix '20mg'$  x 3 days then prn after. Can continue her daily HCTZ  2. Afib - new diagnosis today, EKG shows rate controlled afib - no specific symptoms, self rate controlled not requiring av nodal agent -  CHADS2Vasc score is 6 (CHF,HTN, age x 2, prior stroke), start eliquis '5mg'$  bid - advanced age, dementia, self rate controlled. No strong reason to attempt restoration of sinus rhythm, monitor rates at this time   F/u 4 months      Arnoldo Lenis, M.D.

## 2022-03-21 DIAGNOSIS — R69 Illness, unspecified: Secondary | ICD-10-CM | POA: Diagnosis not present

## 2022-03-22 ENCOUNTER — Ambulatory Visit (INDEPENDENT_AMBULATORY_CARE_PROVIDER_SITE_OTHER): Payer: Medicare HMO

## 2022-03-22 DIAGNOSIS — R69 Illness, unspecified: Secondary | ICD-10-CM | POA: Diagnosis not present

## 2022-03-22 DIAGNOSIS — R0602 Shortness of breath: Secondary | ICD-10-CM

## 2022-03-23 DIAGNOSIS — E119 Type 2 diabetes mellitus without complications: Secondary | ICD-10-CM | POA: Diagnosis not present

## 2022-03-23 DIAGNOSIS — E038 Other specified hypothyroidism: Secondary | ICD-10-CM | POA: Diagnosis not present

## 2022-03-23 DIAGNOSIS — D518 Other vitamin B12 deficiency anemias: Secondary | ICD-10-CM | POA: Diagnosis not present

## 2022-03-23 DIAGNOSIS — R69 Illness, unspecified: Secondary | ICD-10-CM | POA: Diagnosis not present

## 2022-03-23 DIAGNOSIS — I10 Essential (primary) hypertension: Secondary | ICD-10-CM | POA: Diagnosis not present

## 2022-03-23 DIAGNOSIS — E785 Hyperlipidemia, unspecified: Secondary | ICD-10-CM | POA: Diagnosis not present

## 2022-03-23 DIAGNOSIS — E559 Vitamin D deficiency, unspecified: Secondary | ICD-10-CM | POA: Diagnosis not present

## 2022-03-23 LAB — ECHOCARDIOGRAM COMPLETE
AR max vel: 1.51 cm2
AV Area VTI: 1.74 cm2
AV Area mean vel: 1.51 cm2
AV Mean grad: 3.8 mmHg
AV Peak grad: 7.5 mmHg
Ao pk vel: 1.37 m/s
Area-P 1/2: 4.39 cm2
Calc EF: 53 %
MV M vel: 3.56 m/s
MV Peak grad: 50.6 mmHg
S' Lateral: 3.14 cm
Single Plane A2C EF: 53 %
Single Plane A4C EF: 53.6 %

## 2022-03-24 DIAGNOSIS — R69 Illness, unspecified: Secondary | ICD-10-CM | POA: Diagnosis not present

## 2022-03-25 DIAGNOSIS — R69 Illness, unspecified: Secondary | ICD-10-CM | POA: Diagnosis not present

## 2022-03-26 DIAGNOSIS — R69 Illness, unspecified: Secondary | ICD-10-CM | POA: Diagnosis not present

## 2022-03-27 DIAGNOSIS — R69 Illness, unspecified: Secondary | ICD-10-CM | POA: Diagnosis not present

## 2022-03-28 DIAGNOSIS — R69 Illness, unspecified: Secondary | ICD-10-CM | POA: Diagnosis not present

## 2022-03-29 DIAGNOSIS — R69 Illness, unspecified: Secondary | ICD-10-CM | POA: Diagnosis not present

## 2022-03-30 DIAGNOSIS — R69 Illness, unspecified: Secondary | ICD-10-CM | POA: Diagnosis not present

## 2022-03-31 DIAGNOSIS — R69 Illness, unspecified: Secondary | ICD-10-CM | POA: Diagnosis not present

## 2022-04-01 DIAGNOSIS — R69 Illness, unspecified: Secondary | ICD-10-CM | POA: Diagnosis not present

## 2022-04-02 DIAGNOSIS — R69 Illness, unspecified: Secondary | ICD-10-CM | POA: Diagnosis not present

## 2022-04-03 DIAGNOSIS — R69 Illness, unspecified: Secondary | ICD-10-CM | POA: Diagnosis not present

## 2022-04-04 DIAGNOSIS — R69 Illness, unspecified: Secondary | ICD-10-CM | POA: Diagnosis not present

## 2022-04-05 DIAGNOSIS — R69 Illness, unspecified: Secondary | ICD-10-CM | POA: Diagnosis not present

## 2022-04-06 DIAGNOSIS — R69 Illness, unspecified: Secondary | ICD-10-CM | POA: Diagnosis not present

## 2022-04-06 DIAGNOSIS — R011 Cardiac murmur, unspecified: Secondary | ICD-10-CM | POA: Diagnosis not present

## 2022-04-07 DIAGNOSIS — R69 Illness, unspecified: Secondary | ICD-10-CM | POA: Diagnosis not present

## 2022-04-07 DIAGNOSIS — I10 Essential (primary) hypertension: Secondary | ICD-10-CM | POA: Diagnosis not present

## 2022-04-08 DIAGNOSIS — R69 Illness, unspecified: Secondary | ICD-10-CM | POA: Diagnosis not present

## 2022-04-09 DIAGNOSIS — R69 Illness, unspecified: Secondary | ICD-10-CM | POA: Diagnosis not present

## 2022-04-09 DIAGNOSIS — F5101 Primary insomnia: Secondary | ICD-10-CM | POA: Diagnosis not present

## 2022-04-09 DIAGNOSIS — F03918 Unspecified dementia, unspecified severity, with other behavioral disturbance: Secondary | ICD-10-CM | POA: Diagnosis not present

## 2022-04-09 DIAGNOSIS — F419 Anxiety disorder, unspecified: Secondary | ICD-10-CM | POA: Diagnosis not present

## 2022-04-09 DIAGNOSIS — R443 Hallucinations, unspecified: Secondary | ICD-10-CM | POA: Diagnosis not present

## 2022-04-17 DIAGNOSIS — I739 Peripheral vascular disease, unspecified: Secondary | ICD-10-CM | POA: Diagnosis not present

## 2022-04-17 DIAGNOSIS — E785 Hyperlipidemia, unspecified: Secondary | ICD-10-CM | POA: Diagnosis not present

## 2022-04-17 DIAGNOSIS — I10 Essential (primary) hypertension: Secondary | ICD-10-CM | POA: Diagnosis not present

## 2022-04-17 DIAGNOSIS — R0989 Other specified symptoms and signs involving the circulatory and respiratory systems: Secondary | ICD-10-CM | POA: Diagnosis not present

## 2022-04-17 DIAGNOSIS — I6523 Occlusion and stenosis of bilateral carotid arteries: Secondary | ICD-10-CM | POA: Diagnosis not present

## 2022-04-17 DIAGNOSIS — J302 Other seasonal allergic rhinitis: Secondary | ICD-10-CM | POA: Diagnosis not present

## 2022-04-17 DIAGNOSIS — H9190 Unspecified hearing loss, unspecified ear: Secondary | ICD-10-CM | POA: Diagnosis not present

## 2022-04-18 ENCOUNTER — Telehealth: Payer: Self-pay | Admitting: Cardiology

## 2022-04-18 DIAGNOSIS — E038 Other specified hypothyroidism: Secondary | ICD-10-CM | POA: Diagnosis not present

## 2022-04-18 DIAGNOSIS — I10 Essential (primary) hypertension: Secondary | ICD-10-CM | POA: Diagnosis not present

## 2022-04-18 DIAGNOSIS — D518 Other vitamin B12 deficiency anemias: Secondary | ICD-10-CM | POA: Diagnosis not present

## 2022-04-18 DIAGNOSIS — E782 Mixed hyperlipidemia: Secondary | ICD-10-CM | POA: Diagnosis not present

## 2022-04-18 DIAGNOSIS — E559 Vitamin D deficiency, unspecified: Secondary | ICD-10-CM | POA: Diagnosis not present

## 2022-04-18 DIAGNOSIS — E119 Type 2 diabetes mellitus without complications: Secondary | ICD-10-CM | POA: Diagnosis not present

## 2022-04-18 NOTE — Telephone Encounter (Signed)
Pt son called on an update for echo results

## 2022-04-18 NOTE — Telephone Encounter (Signed)
Laurine Blazer, LPN  05/15/7472 40:37 AM EDT Back to Top    Notified son Kennyth Lose), copy to pcp.    Arnoldo Lenis, MD  04/16/2022  1:03 PM EDT     Echo shows heart pumping function is normal. Has some age related stiffness of the heart which can cause some fluid/swelling but its an overall minor finding. Continue current meds     Zandra Abts MD

## 2022-04-25 DIAGNOSIS — R221 Localized swelling, mass and lump, neck: Secondary | ICD-10-CM | POA: Diagnosis not present

## 2022-04-25 DIAGNOSIS — E042 Nontoxic multinodular goiter: Secondary | ICD-10-CM | POA: Diagnosis not present

## 2022-04-30 DIAGNOSIS — I70223 Atherosclerosis of native arteries of extremities with rest pain, bilateral legs: Secondary | ICD-10-CM | POA: Diagnosis not present

## 2022-05-03 DIAGNOSIS — R69 Illness, unspecified: Secondary | ICD-10-CM | POA: Diagnosis not present

## 2022-05-03 DIAGNOSIS — F5101 Primary insomnia: Secondary | ICD-10-CM | POA: Diagnosis not present

## 2022-05-03 DIAGNOSIS — F419 Anxiety disorder, unspecified: Secondary | ICD-10-CM | POA: Diagnosis not present

## 2022-05-03 DIAGNOSIS — R443 Hallucinations, unspecified: Secondary | ICD-10-CM | POA: Diagnosis not present

## 2022-05-03 DIAGNOSIS — F03918 Unspecified dementia, unspecified severity, with other behavioral disturbance: Secondary | ICD-10-CM | POA: Diagnosis not present

## 2022-05-08 DIAGNOSIS — I77811 Abdominal aortic ectasia: Secondary | ICD-10-CM | POA: Diagnosis not present

## 2022-05-09 DIAGNOSIS — I10 Essential (primary) hypertension: Secondary | ICD-10-CM | POA: Diagnosis not present

## 2022-05-10 DIAGNOSIS — R2231 Localized swelling, mass and lump, right upper limb: Secondary | ICD-10-CM | POA: Diagnosis not present

## 2022-05-15 DIAGNOSIS — R590 Localized enlarged lymph nodes: Secondary | ICD-10-CM | POA: Diagnosis not present

## 2022-05-17 DIAGNOSIS — D518 Other vitamin B12 deficiency anemias: Secondary | ICD-10-CM | POA: Diagnosis not present

## 2022-05-17 DIAGNOSIS — E782 Mixed hyperlipidemia: Secondary | ICD-10-CM | POA: Diagnosis not present

## 2022-05-17 DIAGNOSIS — E119 Type 2 diabetes mellitus without complications: Secondary | ICD-10-CM | POA: Diagnosis not present

## 2022-05-17 DIAGNOSIS — E038 Other specified hypothyroidism: Secondary | ICD-10-CM | POA: Diagnosis not present

## 2022-05-17 DIAGNOSIS — I10 Essential (primary) hypertension: Secondary | ICD-10-CM | POA: Diagnosis not present

## 2022-05-17 DIAGNOSIS — E559 Vitamin D deficiency, unspecified: Secondary | ICD-10-CM | POA: Diagnosis not present

## 2022-05-20 DIAGNOSIS — R296 Repeated falls: Secondary | ICD-10-CM | POA: Diagnosis not present

## 2022-05-22 DIAGNOSIS — J302 Other seasonal allergic rhinitis: Secondary | ICD-10-CM | POA: Diagnosis not present

## 2022-05-22 DIAGNOSIS — I10 Essential (primary) hypertension: Secondary | ICD-10-CM | POA: Diagnosis not present

## 2022-05-22 DIAGNOSIS — I739 Peripheral vascular disease, unspecified: Secondary | ICD-10-CM | POA: Diagnosis not present

## 2022-05-22 DIAGNOSIS — H9190 Unspecified hearing loss, unspecified ear: Secondary | ICD-10-CM | POA: Diagnosis not present

## 2022-05-22 DIAGNOSIS — E559 Vitamin D deficiency, unspecified: Secondary | ICD-10-CM | POA: Diagnosis not present

## 2022-05-28 DIAGNOSIS — E038 Other specified hypothyroidism: Secondary | ICD-10-CM | POA: Diagnosis not present

## 2022-05-28 DIAGNOSIS — Z79899 Other long term (current) drug therapy: Secondary | ICD-10-CM | POA: Diagnosis not present

## 2022-05-28 DIAGNOSIS — E559 Vitamin D deficiency, unspecified: Secondary | ICD-10-CM | POA: Diagnosis not present

## 2022-05-28 DIAGNOSIS — D518 Other vitamin B12 deficiency anemias: Secondary | ICD-10-CM | POA: Diagnosis not present

## 2022-05-28 DIAGNOSIS — E782 Mixed hyperlipidemia: Secondary | ICD-10-CM | POA: Diagnosis not present

## 2022-06-04 DIAGNOSIS — R69 Illness, unspecified: Secondary | ICD-10-CM | POA: Diagnosis not present

## 2022-06-05 DIAGNOSIS — R69 Illness, unspecified: Secondary | ICD-10-CM | POA: Diagnosis not present

## 2022-06-06 DIAGNOSIS — R69 Illness, unspecified: Secondary | ICD-10-CM | POA: Diagnosis not present

## 2022-06-07 DIAGNOSIS — R69 Illness, unspecified: Secondary | ICD-10-CM | POA: Diagnosis not present

## 2022-06-07 DIAGNOSIS — F03918 Unspecified dementia, unspecified severity, with other behavioral disturbance: Secondary | ICD-10-CM | POA: Diagnosis not present

## 2022-06-07 DIAGNOSIS — F5101 Primary insomnia: Secondary | ICD-10-CM | POA: Diagnosis not present

## 2022-06-07 DIAGNOSIS — F419 Anxiety disorder, unspecified: Secondary | ICD-10-CM | POA: Diagnosis not present

## 2022-06-07 DIAGNOSIS — R443 Hallucinations, unspecified: Secondary | ICD-10-CM | POA: Diagnosis not present

## 2022-06-08 DIAGNOSIS — R69 Illness, unspecified: Secondary | ICD-10-CM | POA: Diagnosis not present

## 2022-06-09 DIAGNOSIS — R69 Illness, unspecified: Secondary | ICD-10-CM | POA: Diagnosis not present

## 2022-06-09 DIAGNOSIS — I10 Essential (primary) hypertension: Secondary | ICD-10-CM | POA: Diagnosis not present

## 2022-06-10 DIAGNOSIS — R69 Illness, unspecified: Secondary | ICD-10-CM | POA: Diagnosis not present

## 2022-06-11 DIAGNOSIS — R69 Illness, unspecified: Secondary | ICD-10-CM | POA: Diagnosis not present

## 2022-06-12 DIAGNOSIS — R69 Illness, unspecified: Secondary | ICD-10-CM | POA: Diagnosis not present

## 2022-06-13 DIAGNOSIS — R69 Illness, unspecified: Secondary | ICD-10-CM | POA: Diagnosis not present

## 2022-06-14 DIAGNOSIS — R69 Illness, unspecified: Secondary | ICD-10-CM | POA: Diagnosis not present

## 2022-06-15 DIAGNOSIS — R69 Illness, unspecified: Secondary | ICD-10-CM | POA: Diagnosis not present

## 2022-06-16 DIAGNOSIS — R69 Illness, unspecified: Secondary | ICD-10-CM | POA: Diagnosis not present

## 2022-06-17 DIAGNOSIS — R69 Illness, unspecified: Secondary | ICD-10-CM | POA: Diagnosis not present

## 2022-06-18 DIAGNOSIS — I10 Essential (primary) hypertension: Secondary | ICD-10-CM | POA: Diagnosis not present

## 2022-06-18 DIAGNOSIS — E559 Vitamin D deficiency, unspecified: Secondary | ICD-10-CM | POA: Diagnosis not present

## 2022-06-18 DIAGNOSIS — D518 Other vitamin B12 deficiency anemias: Secondary | ICD-10-CM | POA: Diagnosis not present

## 2022-06-18 DIAGNOSIS — E119 Type 2 diabetes mellitus without complications: Secondary | ICD-10-CM | POA: Diagnosis not present

## 2022-06-18 DIAGNOSIS — E038 Other specified hypothyroidism: Secondary | ICD-10-CM | POA: Diagnosis not present

## 2022-06-18 DIAGNOSIS — E785 Hyperlipidemia, unspecified: Secondary | ICD-10-CM | POA: Diagnosis not present

## 2022-06-18 DIAGNOSIS — R69 Illness, unspecified: Secondary | ICD-10-CM | POA: Diagnosis not present

## 2022-06-19 DIAGNOSIS — R69 Illness, unspecified: Secondary | ICD-10-CM | POA: Diagnosis not present

## 2022-06-20 DIAGNOSIS — R69 Illness, unspecified: Secondary | ICD-10-CM | POA: Diagnosis not present

## 2022-06-21 DIAGNOSIS — R69 Illness, unspecified: Secondary | ICD-10-CM | POA: Diagnosis not present

## 2022-06-22 DIAGNOSIS — R69 Illness, unspecified: Secondary | ICD-10-CM | POA: Diagnosis not present

## 2022-06-23 DIAGNOSIS — R69 Illness, unspecified: Secondary | ICD-10-CM | POA: Diagnosis not present

## 2022-06-24 DIAGNOSIS — R69 Illness, unspecified: Secondary | ICD-10-CM | POA: Diagnosis not present

## 2022-06-25 DIAGNOSIS — R69 Illness, unspecified: Secondary | ICD-10-CM | POA: Diagnosis not present

## 2022-06-26 DIAGNOSIS — R69 Illness, unspecified: Secondary | ICD-10-CM | POA: Diagnosis not present

## 2022-06-27 ENCOUNTER — Emergency Department (HOSPITAL_COMMUNITY): Payer: Medicare HMO

## 2022-06-27 ENCOUNTER — Inpatient Hospital Stay (HOSPITAL_COMMUNITY)
Admission: EM | Admit: 2022-06-27 | Discharge: 2022-07-03 | DRG: 511 | Disposition: A | Discharge status: Nursing Home (Includes ICF) | Payer: Medicare HMO | Source: Skilled Nursing Facility | Attending: Internal Medicine | Admitting: Internal Medicine

## 2022-06-27 ENCOUNTER — Encounter (HOSPITAL_COMMUNITY): Payer: Self-pay | Admitting: Emergency Medicine

## 2022-06-27 ENCOUNTER — Other Ambulatory Visit: Payer: Self-pay

## 2022-06-27 DIAGNOSIS — Z79899 Other long term (current) drug therapy: Secondary | ICD-10-CM

## 2022-06-27 DIAGNOSIS — R296 Repeated falls: Secondary | ICD-10-CM | POA: Diagnosis present

## 2022-06-27 DIAGNOSIS — Z8673 Personal history of transient ischemic attack (TIA), and cerebral infarction without residual deficits: Secondary | ICD-10-CM | POA: Diagnosis not present

## 2022-06-27 DIAGNOSIS — G319 Degenerative disease of nervous system, unspecified: Secondary | ICD-10-CM | POA: Diagnosis not present

## 2022-06-27 DIAGNOSIS — S52501A Unspecified fracture of the lower end of right radius, initial encounter for closed fracture: Secondary | ICD-10-CM | POA: Diagnosis not present

## 2022-06-27 DIAGNOSIS — S62101B Fracture of unspecified carpal bone, right wrist, initial encounter for open fracture: Secondary | ICD-10-CM | POA: Diagnosis present

## 2022-06-27 DIAGNOSIS — M25521 Pain in right elbow: Secondary | ICD-10-CM | POA: Diagnosis not present

## 2022-06-27 DIAGNOSIS — Z01818 Encounter for other preprocedural examination: Secondary | ICD-10-CM

## 2022-06-27 DIAGNOSIS — S52591A Other fractures of lower end of right radius, initial encounter for closed fracture: Secondary | ICD-10-CM | POA: Diagnosis not present

## 2022-06-27 DIAGNOSIS — S52351A Displaced comminuted fracture of shaft of radius, right arm, initial encounter for closed fracture: Secondary | ICD-10-CM | POA: Diagnosis not present

## 2022-06-27 DIAGNOSIS — S52601B Unspecified fracture of lower end of right ulna, initial encounter for open fracture type I or II: Secondary | ICD-10-CM | POA: Diagnosis present

## 2022-06-27 DIAGNOSIS — E876 Hypokalemia: Secondary | ICD-10-CM | POA: Diagnosis present

## 2022-06-27 DIAGNOSIS — Z96641 Presence of right artificial hip joint: Secondary | ICD-10-CM | POA: Diagnosis present

## 2022-06-27 DIAGNOSIS — Z7189 Other specified counseling: Secondary | ICD-10-CM

## 2022-06-27 DIAGNOSIS — F03C0 Unspecified dementia, severe, without behavioral disturbance, psychotic disturbance, mood disturbance, and anxiety: Secondary | ICD-10-CM | POA: Diagnosis present

## 2022-06-27 DIAGNOSIS — I10 Essential (primary) hypertension: Secondary | ICD-10-CM | POA: Diagnosis present

## 2022-06-27 DIAGNOSIS — I6782 Cerebral ischemia: Secondary | ICD-10-CM | POA: Diagnosis not present

## 2022-06-27 DIAGNOSIS — R001 Bradycardia, unspecified: Secondary | ICD-10-CM | POA: Diagnosis not present

## 2022-06-27 DIAGNOSIS — S0993XA Unspecified injury of face, initial encounter: Secondary | ICD-10-CM | POA: Diagnosis not present

## 2022-06-27 DIAGNOSIS — H9190 Unspecified hearing loss, unspecified ear: Secondary | ICD-10-CM | POA: Diagnosis not present

## 2022-06-27 DIAGNOSIS — S199XXA Unspecified injury of neck, initial encounter: Secondary | ICD-10-CM | POA: Diagnosis not present

## 2022-06-27 DIAGNOSIS — W1830XA Fall on same level, unspecified, initial encounter: Secondary | ICD-10-CM | POA: Diagnosis present

## 2022-06-27 DIAGNOSIS — S62109A Fracture of unspecified carpal bone, unspecified wrist, initial encounter for closed fracture: Secondary | ICD-10-CM | POA: Diagnosis not present

## 2022-06-27 DIAGNOSIS — M4312 Spondylolisthesis, cervical region: Secondary | ICD-10-CM | POA: Diagnosis not present

## 2022-06-27 DIAGNOSIS — R339 Retention of urine, unspecified: Secondary | ICD-10-CM | POA: Diagnosis not present

## 2022-06-27 DIAGNOSIS — M25511 Pain in right shoulder: Secondary | ICD-10-CM | POA: Diagnosis not present

## 2022-06-27 DIAGNOSIS — D62 Acute posthemorrhagic anemia: Secondary | ICD-10-CM | POA: Diagnosis not present

## 2022-06-27 DIAGNOSIS — Y92129 Unspecified place in nursing home as the place of occurrence of the external cause: Secondary | ICD-10-CM

## 2022-06-27 DIAGNOSIS — K5900 Constipation, unspecified: Secondary | ICD-10-CM | POA: Diagnosis not present

## 2022-06-27 DIAGNOSIS — S52331A Displaced oblique fracture of shaft of right radius, initial encounter for closed fracture: Secondary | ICD-10-CM | POA: Diagnosis not present

## 2022-06-27 DIAGNOSIS — F0390 Unspecified dementia without behavioral disturbance: Secondary | ICD-10-CM | POA: Diagnosis present

## 2022-06-27 DIAGNOSIS — Z743 Need for continuous supervision: Secondary | ICD-10-CM | POA: Diagnosis not present

## 2022-06-27 DIAGNOSIS — M4602 Spinal enthesopathy, cervical region: Secondary | ICD-10-CM | POA: Diagnosis not present

## 2022-06-27 DIAGNOSIS — S52501B Unspecified fracture of the lower end of right radius, initial encounter for open fracture type I or II: Principal | ICD-10-CM | POA: Diagnosis present

## 2022-06-27 DIAGNOSIS — Z87891 Personal history of nicotine dependence: Secondary | ICD-10-CM

## 2022-06-27 DIAGNOSIS — F05 Delirium due to known physiological condition: Secondary | ICD-10-CM | POA: Diagnosis present

## 2022-06-27 DIAGNOSIS — I4891 Unspecified atrial fibrillation: Secondary | ICD-10-CM | POA: Diagnosis present

## 2022-06-27 DIAGNOSIS — I639 Cerebral infarction, unspecified: Secondary | ICD-10-CM | POA: Diagnosis not present

## 2022-06-27 DIAGNOSIS — I517 Cardiomegaly: Secondary | ICD-10-CM | POA: Diagnosis not present

## 2022-06-27 DIAGNOSIS — R69 Illness, unspecified: Secondary | ICD-10-CM | POA: Diagnosis not present

## 2022-06-27 DIAGNOSIS — R58 Hemorrhage, not elsewhere classified: Secondary | ICD-10-CM | POA: Diagnosis not present

## 2022-06-27 DIAGNOSIS — K219 Gastro-esophageal reflux disease without esophagitis: Secondary | ICD-10-CM | POA: Diagnosis not present

## 2022-06-27 DIAGNOSIS — Z66 Do not resuscitate: Secondary | ICD-10-CM | POA: Diagnosis not present

## 2022-06-27 DIAGNOSIS — I480 Paroxysmal atrial fibrillation: Secondary | ICD-10-CM | POA: Diagnosis not present

## 2022-06-27 DIAGNOSIS — W19XXXA Unspecified fall, initial encounter: Secondary | ICD-10-CM | POA: Diagnosis present

## 2022-06-27 DIAGNOSIS — R52 Pain, unspecified: Secondary | ICD-10-CM | POA: Diagnosis not present

## 2022-06-27 DIAGNOSIS — Z043 Encounter for examination and observation following other accident: Secondary | ICD-10-CM | POA: Diagnosis not present

## 2022-06-27 DIAGNOSIS — Z7901 Long term (current) use of anticoagulants: Secondary | ICD-10-CM

## 2022-06-27 DIAGNOSIS — S52601A Unspecified fracture of lower end of right ulna, initial encounter for closed fracture: Secondary | ICD-10-CM | POA: Diagnosis not present

## 2022-06-27 LAB — CBC WITH DIFFERENTIAL/PLATELET
Abs Immature Granulocytes: 0.03 10*3/uL (ref 0.00–0.07)
Basophils Absolute: 0.1 10*3/uL (ref 0.0–0.1)
Basophils Relative: 1 %
Eosinophils Absolute: 0.1 10*3/uL (ref 0.0–0.5)
Eosinophils Relative: 2 %
HCT: 41.8 % (ref 36.0–46.0)
Hemoglobin: 13.8 g/dL (ref 12.0–15.0)
Immature Granulocytes: 0 %
Lymphocytes Relative: 12 %
Lymphs Abs: 1.1 10*3/uL (ref 0.7–4.0)
MCH: 30.3 pg (ref 26.0–34.0)
MCHC: 33 g/dL (ref 30.0–36.0)
MCV: 91.7 fL (ref 80.0–100.0)
Monocytes Absolute: 0.4 10*3/uL (ref 0.1–1.0)
Monocytes Relative: 5 %
Neutro Abs: 7.3 10*3/uL (ref 1.7–7.7)
Neutrophils Relative %: 80 %
Platelets: 175 10*3/uL (ref 150–400)
RBC: 4.56 MIL/uL (ref 3.87–5.11)
RDW: 14.4 % (ref 11.5–15.5)
WBC: 9 10*3/uL (ref 4.0–10.5)
nRBC: 0 % (ref 0.0–0.2)

## 2022-06-27 LAB — COMPREHENSIVE METABOLIC PANEL
ALT: 15 U/L (ref 0–44)
AST: 24 U/L (ref 15–41)
Albumin: 4 g/dL (ref 3.5–5.0)
Alkaline Phosphatase: 117 U/L (ref 38–126)
Anion gap: 9 (ref 5–15)
BUN: 18 mg/dL (ref 8–23)
CO2: 25 mmol/L (ref 22–32)
Calcium: 9.6 mg/dL (ref 8.9–10.3)
Chloride: 105 mmol/L (ref 98–111)
Creatinine, Ser: 1.11 mg/dL — ABNORMAL HIGH (ref 0.44–1.00)
GFR, Estimated: 48 mL/min — ABNORMAL LOW (ref >60)
Glucose, Bld: 133 mg/dL — ABNORMAL HIGH (ref 70–99)
Potassium: 3.5 mmol/L (ref 3.5–5.1)
Sodium: 139 mmol/L (ref 135–145)
Total Bilirubin: 0.9 mg/dL (ref 0.3–1.2)
Total Protein: 7.3 g/dL (ref 6.5–8.1)

## 2022-06-27 MED ORDER — GABAPENTIN 100 MG PO CAPS
100.0000 mg | ORAL_CAPSULE | Freq: Every day | ORAL | Status: DC
  Administered 2022-06-27: 100 mg via ORAL
  Filled 2022-06-27: qty 1

## 2022-06-27 MED ORDER — FENTANYL CITRATE PF 50 MCG/ML IJ SOSY
25.0000 ug | PREFILLED_SYRINGE | INTRAMUSCULAR | Status: DC | PRN
  Administered 2022-06-27 – 2022-06-28 (×2): 25 ug via INTRAVENOUS
  Filled 2022-06-27 (×2): qty 1

## 2022-06-27 MED ORDER — HYDRALAZINE HCL 20 MG/ML IJ SOLN
5.0000 mg | INTRAMUSCULAR | Status: DC | PRN
  Administered 2022-06-27: 5 mg via INTRAVENOUS
  Filled 2022-06-27: qty 1

## 2022-06-27 MED ORDER — ONDANSETRON HCL 4 MG PO TABS: 4.0000 mg | ORAL_TABLET | Freq: Four times a day (QID) | ORAL | Status: DC | PRN

## 2022-06-27 MED ORDER — PAROXETINE HCL 10 MG PO TABS
10.0000 mg | ORAL_TABLET | Freq: Every day | ORAL | Status: DC
  Administered 2022-06-28: 10 mg via ORAL
  Filled 2022-06-27: qty 1

## 2022-06-27 MED ORDER — FENTANYL CITRATE PF 50 MCG/ML IJ SOSY
12.5000 ug | PREFILLED_SYRINGE | Freq: Once | INTRAMUSCULAR | Status: AC
  Administered 2022-06-27: 12.5 ug via INTRAVENOUS
  Filled 2022-06-27: qty 1

## 2022-06-27 MED ORDER — POLYETHYLENE GLYCOL 3350 17 G PO PACK: 17.0000 g | PACK | Freq: Every day | ORAL | Status: DC | PRN

## 2022-06-27 MED ORDER — POTASSIUM CHLORIDE IN NACL 20-0.9 MEQ/L-% IV SOLN: INTRAVENOUS | Status: AC

## 2022-06-27 MED ORDER — QUETIAPINE FUMARATE 25 MG PO TABS
12.5000 mg | ORAL_TABLET | Freq: Every day | ORAL | Status: DC
  Administered 2022-06-27 – 2022-06-30 (×4): 12.5 mg via ORAL
  Filled 2022-06-27 (×4): qty 1

## 2022-06-27 MED ORDER — ACETAMINOPHEN 650 MG RE SUPP: 650.0000 mg | Freq: Four times a day (QID) | RECTAL | Status: DC | PRN

## 2022-06-27 MED ORDER — CEFAZOLIN SODIUM-DEXTROSE 1-4 GM/50ML-% IV SOLN
1.0000 g | Freq: Three times a day (TID) | INTRAVENOUS | Status: DC
  Administered 2022-06-27 – 2022-06-28 (×2): 1 g via INTRAVENOUS
  Filled 2022-06-27 (×8): qty 50

## 2022-06-27 MED ORDER — ACETAMINOPHEN 325 MG PO TABS
650.0000 mg | ORAL_TABLET | Freq: Four times a day (QID) | ORAL | Status: DC | PRN
  Filled 2022-06-27: qty 2

## 2022-06-27 MED ORDER — ONDANSETRON HCL 4 MG/2ML IJ SOLN: 4.0000 mg | Freq: Four times a day (QID) | INTRAMUSCULAR | Status: DC | PRN

## 2022-06-27 MED ORDER — FENTANYL CITRATE PF 50 MCG/ML IJ SOSY: 12.5000 ug | PREFILLED_SYRINGE | INTRAMUSCULAR | Status: DC | PRN

## 2022-06-27 NOTE — Assessment & Plan Note (Addendum)
Status post fall.  With open wound.  X-ray showing acute mildly comminuted mildly displaced fracture of distal radial metaphysis, also mildly displaced oblique fracture of the distal ulna metaphysis. - EDP talked to Dr. Aline Brochure, will see in consult tomorrow, likely surgery 10/20 -Hold Eliquis - N/s + 20 kcl 75cc/hr x 20hrs -IV fentanyl 25 mg every 4 hours as needed -IV cefazolin q8h started

## 2022-06-27 NOTE — Assessment & Plan Note (Signed)
Stable. -Resume Seroquel, paroxetine

## 2022-06-27 NOTE — H&P (View-Only) (Signed)
Patient ID: Christie Nelson, female   DOB: 1930/04/28, 88 y.o.   MRN: [id number]   Antibiotics have been given within 6 hrs.  The px is on eloquis, she will need to wait for surgery until Friday

## 2022-06-27 NOTE — ED Provider Notes (Signed)
[facility] EMERGENCY DEPARTMENT Provider Note   CSN: [id number] Arrival date & time: 06/27/22  1520     History  Chief Complaint  Patient presents with   Christie Nelson is a 88 y.o. female.  Patient brought in by EMS from [facility].  Unwitnessed fall.  Patient with bruising to her right shoulder right elbow and has a deformity to her right wrist.  Also complaining of right jaw pain.  Patient is on the blood thinner Eliquis.  The right wrist on the ulnar side does have a puncture wound.  Patient has a known history of dementia hypertension gastroesophageal reflux disease has had abdominal hysterectomy in the past and a total hip arthroplasty in 2013 patient is a former smoker quit 1989.       Home Medications Prior to Admission medications   Medication Sig Start Date End Date Taking? Authorizing Provider  acetaminophen (TYLENOL) 500 MG tablet Take 1 tablet by mouth 3 (three) times daily as needed. 02/06/22  Yes [provider]  apixaban (ELIQUIS) 5 MG TABS tablet Take 1 tablet (5 mg total) by mouth 2 (two) times daily. 03/20/22  Yes Branch, Alphonse Guild, MD  atorvastatin (LIPITOR) 10 MG tablet Take 1 tablet by mouth at bedtime. 02/06/22  Yes [provider]  Cholecalciferol (VITAMIN D3) 250 MCG (10000 UT) TABS Take 1 tablet by mouth daily.   Yes [provider]  furosemide (LASIX) 20 MG tablet Take one tab by mouth daily x 3 days, then only as needed for swelling thereafter 03/20/22  Yes Branch, Alphonse Guild, MD  gabapentin (NEURONTIN) 100 MG capsule Take 1 capsule by mouth at bedtime. 02/06/22  Yes [provider]  hydrochlorothiazide (HYDRODIURIL) 12.5 MG tablet Take 12.5 mg by mouth daily. 06/26/22  Yes [provider]  PARoxetine (PAXIL) 10 MG tablet Take 10 mg by mouth daily. 01/09/22  Yes [provider]  potassium chloride SA (KLOR-CON M) 20 MEQ tablet Take 1 tablet by mouth daily. 07/05/20  Yes  [provider]  QUEtiapine (SEROQUEL) 25 MG tablet Take 12.5 mg by mouth at bedtime. 12/17/19  Yes [provider]      Allergies    Patient has no known allergies.    Review of Systems   Review of Systems  Unable to perform ROS: Dementia  All other systems reviewed and are negative.   Physical Exam Updated Vital Signs BP (!) 187/109   Pulse 62   Resp 16   Ht 1.676 m ('5\' 6"'$ )   Wt 68 kg   SpO2 100%   BMI 24.20 kg/m  Physical Exam Vitals and nursing note reviewed.  Constitutional:      General: She is not in acute distress.    Appearance: Normal appearance. She is well-developed.  HENT:     Head: Normocephalic and atraumatic.     Comments: Some tenderness to the right side of the jaw no obvious deformity Eyes:     Extraocular Movements: Extraocular movements intact.     Conjunctiva/sclera: Conjunctivae normal.     Pupils: Pupils are equal, round, and reactive to light.  Cardiovascular:     Rate and Rhythm: Normal rate and regular rhythm.     Heart sounds: No murmur heard. Pulmonary:     Effort: Pulmonary effort is normal. No respiratory distress.     Breath sounds: Normal breath sounds.  Abdominal:     Palpations: Abdomen is soft.     Tenderness: There  is no abdominal tenderness.  Musculoskeletal:        General: Tenderness and signs of injury present. No swelling.     Cervical back: Normal range of motion and neck supple. No rigidity or tenderness.     Right lower leg: No edema.     Left lower leg: No edema.     Comments: Patient with some superficial abrasions and and some bruising to the right shoulder no obvious deformity there.  Also of the same at the elbow without any obvious deformity some swelling to the elbow area.  Obvious deformity to the right wrist.  But good cap refill radial pulse is 2+.  Patient with a wound measuring about 2 cm with slight a puncture wound at the distal ulnar area.  This is concerning for open fracture.  Tenderness to  palpation clinically appears to be distal radius and ulnar fracture.  Questionable pain to the right hip area.  No deformity to the right lower extremity.  And distally cap refills intact.  Skin:    General: Skin is warm and dry.     Capillary Refill: Capillary refill takes less than 2 seconds.  Neurological:     General: No focal deficit present.     Mental Status: She is alert. Mental status is at baseline.  Psychiatric:        Mood and Affect: Mood normal.     ED Results / Procedures / Treatments   Labs (all labs ordered are listed, but only abnormal results are displayed) Labs Reviewed  COMPREHENSIVE METABOLIC PANEL - Abnormal; Notable for the following components:      Result Value   Glucose, Bld 133 (*)    Creatinine, Ser 1.11 (*)    GFR, Estimated 48 (*)    All other components within normal limits  CBC WITH DIFFERENTIAL/PLATELET    EKG EKG Interpretation  Date/Time:  Wednesday June 27 2022 15:35:34 EDT Ventricular Rate:  96 PR Interval:  124 QRS Duration: 147 QT Interval:  404 QTC Calculation: 511 R Axis:   52 Text Interpretation: Unknown rhythm, irregular rate Right bundle branch block Confirmed by Fredia Sorrow [phone]) on 06/27/2022 5:36:27 PM  Radiology DG HIPS BILAT WITH PELVIS MIN 5 VIEWS  Result Date: 06/27/2022 CLINICAL DATA:  Unwitnessed fall. EXAM: DG HIP (WITH OR WITHOUT PELVIS) 5+V BILAT COMPARISON:  AP pelvis 01/31/2022 FINDINGS: Status post bilateral total hip arthroplasty. The right-sided arthroplasty hardware appears represent area of vision, with multiple superior right acetabular cup screws, a fractured cerclage wire in the region of the right greater trochanter, and multiple cerclage wires around a moderately long right femoral stem. There are chronic changes within the right acetabulum including a likely chronic fracture line lucency within the superomedial right acetabulum. No new lucency is seen around either total hip arthroplasty to  indicate hardware failure. No acute fracture or dislocation. IMPRESSION: 1. Status post bilateral total hip arthroplasty without evidence of hardware failure. 2. Chronic changes within the right acetabulum. No acute fracture is seen. Electronically Signed   By: Yvonne Kendall M.D.   On: 06/27/2022 18:11   DG Wrist 2 Views Right  Result Date: 06/27/2022 CLINICAL DATA:  Unwitnessed fall. Right wrist bandage with blood on bandage. EXAM: RIGHT WRIST - 2 VIEW COMPARISON:  None Available. FINDINGS: There is diffuse decreased bone mineralization. Oblique, mildly comminuted fracture of the distal radial metadiaphysis. There is 3 mm lateral cortical step-off of the distal fracture component at the lateral aspect of the fracture line but  no cortical step-off at the medial aspect of the fracture line. There appears to be 2 mm volar cortical step-off on lateral view. Additional oblique acute fracture of the distal ulnar metadiaphysis. Approximately 7 mm lateral and 3 mm dorsal displacement of the distal fracture component with respect to the proximal fracture component. 4 mm ulnar positive variance. Moderate to severe radiocarpal joint space narrowing. Widening of the scapholunate interval up to 4 mm. Moderate triscaphe and thumb carpometacarpal and mild-to-moderate second through fifth carpometacarpal joint space narrowing. IMPRESSION: 1. Acute, mildly comminuted, and mildly displaced fracture of the distal radial metadiaphysis. 2. Acute, mildly displaced oblique fracture of the distal ulnar metadiaphysis. Electronically Signed   By: Yvonne Kendall M.D.   On: 06/27/2022 18:04   DG Elbow Complete Right  Result Date: 06/27/2022 CLINICAL DATA:  Unwitnessed fall.  Pain. EXAM: RIGHT ELBOW - COMPLETE 3+ VIEW COMPARISON:  None Available. FINDINGS: There is diffuse decreased bone mineralization. No definite joint effusion. No acute fracture is seen. No dislocation. Minimal degenerative spurring at the peripheral aspect of the  medial trochlea and coronoid process. Minimal chronic enthesopathic change at the common extensor origin at the lateral epicondyle. IMPRESSION: 1. Within limitations of diffuse decreased bone mineralization, no acute fracture is seen. 2. Minimal osteoarthritis. Electronically Signed   By: Yvonne Kendall M.D.   On: 06/27/2022 18:01   DG Shoulder Right  Result Date: 06/27/2022 CLINICAL DATA:  Unwitnessed fall.  Right shoulder pain. EXAM: RIGHT SHOULDER - 2+ VIEW COMPARISON:  None Available. FINDINGS: Mild inferior glenoid degenerative osteophytosis. Mild acromioclavicular joint space narrowing and peripheral osteophytosis. No acute fracture is seen. No dislocation. The visualized portion of the right lung is unremarkable. IMPRESSION: Mild acromioclavicular and glenohumeral osteoarthritis. No acute fracture is seen. Electronically Signed   By: Yvonne Kendall M.D.   On: 06/27/2022 17:59   DG Chest 1 View  Result Date: 06/27/2022 CLINICAL DATA:  Unwitnessed fall. EXAM: CHEST  1 VIEW COMPARISON:  AP chest 01/31/2022 FINDINGS: Cardiac silhouette is again borderline enlarged. Moderate calcification is seen within the aortic arch. There is mild chronic bilateral interstitial thickening. No new focal airspace opacity. No pleural effusion pneumothorax. Moderate multilevel degenerative disc changes of the thoracic spine. IMPRESSION: 1. No acute lung process. 2. Mild chronic bilateral interstitial thickening, likely chronic interstitial scarring. Electronically Signed   By: Yvonne Kendall M.D.   On: 06/27/2022 17:58   CT Head Wo Contrast  Result Date: 06/27/2022 CLINICAL DATA:  Fall, head trauma EXAM: CT HEAD WITHOUT CONTRAST CT MAXILLOFACIAL WITHOUT CONTRAST CT CERVICAL SPINE WITHOUT CONTRAST TECHNIQUE: Multidetector CT imaging of the head, cervical spine, and maxillofacial structures were performed using the standard protocol without intravenous contrast. Multiplanar CT image reconstructions of the cervical spine  and maxillofacial structures were also generated. RADIATION DOSE REDUCTION: This exam was performed according to the departmental dose-optimization program which includes automated exposure control, adjustment of the mA and/or kV according to patient size and/or use of iterative reconstruction technique. COMPARISON:  CT head 01/31/2022 FINDINGS: CT HEAD FINDINGS Brain: Generalized atrophy. Moderate white matter hypodensity bilaterally unchanged. Chronic infarct right basal ganglia unchanged. Negative for acute infarct, hemorrhage, mass. Vascular: Negative for hyperdense vessel Skull: Negative Other: None CT MAXILLOFACIAL FINDINGS Osseous: Negative for acute fracture. Chronic healed fracture left zygomatic arch unchanged from the prior head CT Orbits: Bilateral cataract extraction.  No orbital mass or edema. Sinuses: Nasal sinuses clear. Visualized mastoid and middle ear clear. Soft tissues: No soft tissue mass or edema in the face.  CT CERVICAL SPINE FINDINGS Alignment: Mild retrolisthesis C3-4 and C5-6 Skull base and vertebrae: Negative for cervical spine fracture Soft tissues and spinal canal: Negative for soft tissue mass or edema in the neck. Disc levels: Disc degeneration and spurring most prominent C3-4 and C5-6. Mild spinal stenosis and foraminal stenosis at C3-4 and C5-6 Upper chest: Visualized lung apices clear Other: None IMPRESSION: 1. No acute intracranial abnormality. Atrophy and chronic microvascular ischemia. 2. Negative for acute facial fracture. 3. Negative for cervical spine fracture. Electronically Signed   By: Franchot Gallo M.D.   On: 06/27/2022 17:44   CT Cervical Spine Wo Contrast  Result Date: 06/27/2022 CLINICAL DATA:  Fall, head trauma EXAM: CT HEAD WITHOUT CONTRAST CT MAXILLOFACIAL WITHOUT CONTRAST CT CERVICAL SPINE WITHOUT CONTRAST TECHNIQUE: Multidetector CT imaging of the head, cervical spine, and maxillofacial structures were performed using the standard protocol without  intravenous contrast. Multiplanar CT image reconstructions of the cervical spine and maxillofacial structures were also generated. RADIATION DOSE REDUCTION: This exam was performed according to the departmental dose-optimization program which includes automated exposure control, adjustment of the mA and/or kV according to patient size and/or use of iterative reconstruction technique. COMPARISON:  CT head 01/31/2022 FINDINGS: CT HEAD FINDINGS Brain: Generalized atrophy. Moderate white matter hypodensity bilaterally unchanged. Chronic infarct right basal ganglia unchanged. Negative for acute infarct, hemorrhage, mass. Vascular: Negative for hyperdense vessel Skull: Negative Other: None CT MAXILLOFACIAL FINDINGS Osseous: Negative for acute fracture. Chronic healed fracture left zygomatic arch unchanged from the prior head CT Orbits: Bilateral cataract extraction.  No orbital mass or edema. Sinuses: Nasal sinuses clear. Visualized mastoid and middle ear clear. Soft tissues: No soft tissue mass or edema in the face. CT CERVICAL SPINE FINDINGS Alignment: Mild retrolisthesis C3-4 and C5-6 Skull base and vertebrae: Negative for cervical spine fracture Soft tissues and spinal canal: Negative for soft tissue mass or edema in the neck. Disc levels: Disc degeneration and spurring most prominent C3-4 and C5-6. Mild spinal stenosis and foraminal stenosis at C3-4 and C5-6 Upper chest: Visualized lung apices clear Other: None IMPRESSION: 1. No acute intracranial abnormality. Atrophy and chronic microvascular ischemia. 2. Negative for acute facial fracture. 3. Negative for cervical spine fracture. Electronically Signed   By: Franchot Gallo M.D.   On: 06/27/2022 17:44   CT Maxillofacial WO CM  Result Date: 06/27/2022 CLINICAL DATA:  Fall, head trauma EXAM: CT HEAD WITHOUT CONTRAST CT MAXILLOFACIAL WITHOUT CONTRAST CT CERVICAL SPINE WITHOUT CONTRAST TECHNIQUE: Multidetector CT imaging of the head, cervical spine, and  maxillofacial structures were performed using the standard protocol without intravenous contrast. Multiplanar CT image reconstructions of the cervical spine and maxillofacial structures were also generated. RADIATION DOSE REDUCTION: This exam was performed according to the departmental dose-optimization program which includes automated exposure control, adjustment of the mA and/or kV according to patient size and/or use of iterative reconstruction technique. COMPARISON:  CT head 01/31/2022 FINDINGS: CT HEAD FINDINGS Brain: Generalized atrophy. Moderate white matter hypodensity bilaterally unchanged. Chronic infarct right basal ganglia unchanged. Negative for acute infarct, hemorrhage, mass. Vascular: Negative for hyperdense vessel Skull: Negative Other: None CT MAXILLOFACIAL FINDINGS Osseous: Negative for acute fracture. Chronic healed fracture left zygomatic arch unchanged from the prior head CT Orbits: Bilateral cataract extraction.  No orbital mass or edema. Sinuses: Nasal sinuses clear. Visualized mastoid and middle ear clear. Soft tissues: No soft tissue mass or edema in the face. CT CERVICAL SPINE FINDINGS Alignment: Mild retrolisthesis C3-4 and C5-6 Skull base and vertebrae: Negative for cervical spine fracture  Soft tissues and spinal canal: Negative for soft tissue mass or edema in the neck. Disc levels: Disc degeneration and spurring most prominent C3-4 and C5-6. Mild spinal stenosis and foraminal stenosis at C3-4 and C5-6 Upper chest: Visualized lung apices clear Other: None IMPRESSION: 1. No acute intracranial abnormality. Atrophy and chronic microvascular ischemia. 2. Negative for acute facial fracture. 3. Negative for cervical spine fracture. Electronically Signed   By: Franchot Gallo M.D.   On: 06/27/2022 17:44    Procedures Procedures    Medications Ordered in ED Medications  ceFAZolin (ANCEF) IVPB 1 g/50 mL premix (has no administration in time range)  fentaNYL (SUBLIMAZE) injection 12.5 mcg  (has no administration in time range)    ED Course/ Medical Decision Making/ A&P                           Medical Decision Making Amount and/or Complexity of Data Reviewed Labs: ordered. Radiology: ordered.  Risk Prescription drug management. Decision regarding hospitalization.   Patient will be given Ancef.  There is a fracture to the distal radius and ulnar area.  Their wound on the ulnar aspect of the right wrist highly concerning for open fracture.  CT head neck and face without any acute abnormalities.  X-ray of the left right shoulder and the right elbow without any acute bony abnormalities.  X-ray of the pelvis and hips without any bony abnormality.  Patient is on Eliquis.  Suspect this may be because of the history of stroke.  We will discuss with on-call orthopedics and hospitalist for admission.   Final Clinical Impression(s) / ED Diagnoses Final diagnoses:  Fall, initial encounter  Type I or II open fracture of distal end of right ulna, unspecified fracture morphology, initial encounter  Type I or II open fracture of distal end of right radius with ulna, initial encounter    Rx / DC Orders ED Discharge Orders     None         Fredia Sorrow, MD 06/27/22 1905

## 2022-06-27 NOTE — ED Triage Notes (Addendum)
Patient BIB [facility] from [place] c/o an unwitnessed fall.  Patient reports pain in her right shoulder and right jaw.  EMS reports possible deformity to right shoulder and right wrist. Patient has right wrist bandaged with blood noted on bandage.  Patient is on eliquis.  Patient's wrist bandage removed d/t blood leaking through and small puncture like wound noted to outer side of right wrist.  Deformity noted.  Patient's wrist rebandaged.

## 2022-06-27 NOTE — Assessment & Plan Note (Signed)
Appears to be mechanical fall.  Patient reports multiple falls.  Resides at an ALF.  Head and cervical CT negative for acute abnormality.  Right shoulder and elbow x-ray also unremarkable.  Maxillofacial CT also unremarkable. -Will need PT eval prior to discharge

## 2022-06-27 NOTE — ED Notes (Signed)
Patient assisted on and off bedpan.

## 2022-06-27 NOTE — H&P (Signed)
History and Physical    [facility] EQA:[id number] DOB: [birth year] DOA: 06/27/2022  PCP: Christie Nasuti, MD   Patient coming from: [facility]  I have personally briefly reviewed patient's old medical records in [place]  Chief Complaint: Fall, right wrist pain  HPI: [facility] is a 88 y.o. female with medical history significant for dementia, stroke, hypertension, on anticoagulation.  Patient was brought from [facility] with reports of unwitnessed fall, she is reporting pain to her right shoulder and right jaw, right wrist. My evaluation, patient is awake alert oriented to person and situation, but she also keeps repeating that her husband is coming back but she is unable to tell me his name.  She tells me she has been falling several times.  She denies chest pain, no difficulty breathing.  She does not know why she falls.  ED Course: Temperature 98.  Heart rate 36- 128.  Respirate rate 15-21.  Blood pressure systolic 979-892.  O2 sat 90 to 100% on room air.  Head CT cervical CT right shoulder right elbow x-rays negative for acute abnormality.  Right wrist x-ray-acute mildly comminuted and mildly displaced fracture of the distal radial metadiaphysis, also mildly displaced oblique fracture of distal third diaphysis. EDP talked to Dr. Aline Brochure will see in a.m, likely surgery Friday 10/20.  Review of Systems: As per HPI all other systems reviewed and negative.  Past Medical History:  Diagnosis Date   Arthritis    GERD (gastroesophageal reflux disease)    Hypertension    Prolactinoma ([place])    Stroke [facility])     Past Surgical History:  Procedure Laterality Date   ABDOMINAL HYSTERECTOMY  yrs ago   CATARACT EXTRACTION W/ INTRAOCULAR LENS  IMPLANT, BILATERAL  2008   both eyes done   RECTOCELE REPAIR  yrs ago   right hip arthroplasty  2003   TONSILLECTOMY  age 59   TOTAL HIP ARTHROPLASTY  09/18/2011   Procedure: TOTAL HIP ARTHROPLASTY ANTERIOR APPROACH;  Surgeon: Mauri Pole;  Location: [facility];  Service: Orthopedics;  Laterality: Left;     reports that she quit smoking about 34 years ago. Her smoking use included cigarettes. She has a 7.50 pack-year smoking history. She has never used smokeless tobacco. She reports that she does not drink alcohol and does not use drugs.  No Known Allergies  Unable to ascertain due to dementia.  Prior to Admission medications   Medication Sig Start Date End Date Taking? Authorizing Provider  acetaminophen (TYLENOL) 500 MG tablet Take 1 tablet by mouth 3 (three) times daily as needed. 02/06/22  Yes [provider]  apixaban (ELIQUIS) 5 MG TABS tablet Take 1 tablet (5 mg total) by mouth 2 (two) times daily. 03/20/22  Yes Branch, Alphonse Guild, MD  atorvastatin (LIPITOR) 10 MG tablet Take 1 tablet by mouth at bedtime. 02/06/22  Yes [provider]  Cholecalciferol (VITAMIN D3) 250 MCG (10000 UT) TABS Take 1 tablet by mouth daily.   Yes [provider]  furosemide (LASIX) 20 MG tablet Take one tab by mouth daily x 3 days, then only as needed for swelling thereafter 03/20/22  Yes Branch, Alphonse Guild, MD  gabapentin (NEURONTIN) 100 MG capsule Take 1 capsule by mouth at bedtime. 02/06/22  Yes [provider]  hydrochlorothiazide (HYDRODIURIL) 12.5 MG tablet Take 12.5 mg by mouth daily. 06/26/22  Yes [provider]  PARoxetine (PAXIL) 10 MG tablet Take 10 mg by mouth daily. 01/09/22  Yes [provider]  potassium chloride SA (KLOR-CON M) 20 MEQ tablet Take 1 tablet by mouth daily. 07/05/20  Yes [provider]  QUEtiapine (SEROQUEL) 25 MG tablet Take 12.5 mg by mouth at bedtime. 12/17/19  Yes [provider]    Physical Exam: Vitals:   06/27/22 1830 06/27/22 1900 06/27/22 1952 06/27/22 1954  BP: (!) 173/102 (!) 187/109 (!) 191/90   Pulse: (!) 42 62 (!) 36   Resp: '18 16 18   '$ Temp:    98.2 F (36.8 C)  TempSrc:    Oral  SpO2: 100% 100% 100%   Weight:      Height:         Constitutional: NAD, calm, comfortable Vitals:   06/27/22 1830 06/27/22 1900 06/27/22 1952 06/27/22 1954  BP: (!) 173/102 (!) 187/109 (!) 191/90   Pulse: (!) 42 62 (!) 36   Resp: '18 16 18   '$ Temp:    98.2 F (36.8 C)  TempSrc:    Oral  SpO2: 100% 100% 100%   Weight:      Height:       Eyes: PERRL, lids and conjunctivae normal ENMT: Mucous membranes are moist.   Neck: normal, supple, no masses, no thyromegaly Respiratory: clear to auscultation bilaterally, no wheezing, no crackles. Normal respiratory effort. No accessory muscle use.  Cardiovascular: Regular rate and rhythm, no murmurs / rubs / gallops. No extremity edema.  Lower extremities warm. Abdomen: no tenderness, no masses palpated. No hepatosplenomegaly. Bowel sounds positive.  Musculoskeletal: no clubbing / cyanosis.  Obvious deformity to right distal forearm area.  Open bleeding punctate wound  Skin: no rashes, lesions, ulcers. No induration Neurologic: No apparent cranial nerve abnormality, moving extremities spontaneously.  Right upper extremity not tested. Psychiatric: Normal judgment and insight. Alert and oriented x to person and situation. normal mood.   Labs on Admission: I have personally reviewed following labs and imaging studies  CBC: Recent Labs  Lab 06/27/22 1622  WBC 9.0  NEUTROABS 7.3  HGB 13.8  HCT 41.8  MCV 91.7  PLT 295   Basic Metabolic Panel: Recent Labs  Lab 06/27/22 1800  NA 139  K 3.5  CL 105  CO2 25  GLUCOSE 133*  BUN 18  CREATININE 1.11*  CALCIUM 9.6   GFR: Estimated Creatinine Clearance: 32.2 mL/min (A) (by C-G formula based on SCr of 1.11 mg/dL (H)). Liver Function Tests: Recent Labs  Lab 06/27/22 1800  AST 24  ALT 15  ALKPHOS 117  BILITOT 0.9  PROT 7.3  ALBUMIN 4.0    Radiological Exams on Admission: DG HIPS BILAT WITH PELVIS MIN 5 VIEWS  Result Date: 06/27/2022 CLINICAL DATA:  Unwitnessed fall. EXAM: DG HIP (WITH OR WITHOUT PELVIS) 5+V BILAT COMPARISON:   AP pelvis 01/31/2022 FINDINGS: Status post bilateral total hip arthroplasty. The right-sided arthroplasty hardware appears represent area of vision, with multiple superior right acetabular cup screws, a fractured cerclage wire in the region of the right greater trochanter, and multiple cerclage wires around a moderately long right femoral stem. There are chronic changes within the right acetabulum including a likely chronic fracture line lucency within the superomedial right acetabulum. No new lucency is seen around either total hip arthroplasty to indicate hardware failure. No acute fracture or dislocation. IMPRESSION: 1. Status post bilateral total hip arthroplasty without evidence of hardware failure. 2. Chronic changes within the right acetabulum. No acute fracture is seen. Electronically Signed   By: Yvonne Kendall M.D.   On: 06/27/2022 18:11  DG Wrist 2 Views Right  Result Date: 06/27/2022 CLINICAL DATA:  Unwitnessed fall. Right wrist bandage with blood on bandage. EXAM: RIGHT WRIST - 2 VIEW COMPARISON:  None Available. FINDINGS: There is diffuse decreased bone mineralization. Oblique, mildly comminuted fracture of the distal radial metadiaphysis. There is 3 mm lateral cortical step-off of the distal fracture component at the lateral aspect of the fracture line but no cortical step-off at the medial aspect of the fracture line. There appears to be 2 mm volar cortical step-off on lateral view. Additional oblique acute fracture of the distal ulnar metadiaphysis. Approximately 7 mm lateral and 3 mm dorsal displacement of the distal fracture component with respect to the proximal fracture component. 4 mm ulnar positive variance. Moderate to severe radiocarpal joint space narrowing. Widening of the scapholunate interval up to 4 mm. Moderate triscaphe and thumb carpometacarpal and mild-to-moderate second through fifth carpometacarpal joint space narrowing. IMPRESSION: 1. Acute, mildly comminuted, and mildly  displaced fracture of the distal radial metadiaphysis. 2. Acute, mildly displaced oblique fracture of the distal ulnar metadiaphysis. Electronically Signed   By: Yvonne Kendall M.D.   On: 06/27/2022 18:04   DG Elbow Complete Right  Result Date: 06/27/2022 CLINICAL DATA:  Unwitnessed fall.  Pain. EXAM: RIGHT ELBOW - COMPLETE 3+ VIEW COMPARISON:  None Available. FINDINGS: There is diffuse decreased bone mineralization. No definite joint effusion. No acute fracture is seen. No dislocation. Minimal degenerative spurring at the peripheral aspect of the medial trochlea and coronoid process. Minimal chronic enthesopathic change at the common extensor origin at the lateral epicondyle. IMPRESSION: 1. Within limitations of diffuse decreased bone mineralization, no acute fracture is seen. 2. Minimal osteoarthritis. Electronically Signed   By: Yvonne Kendall M.D.   On: 06/27/2022 18:01   DG Shoulder Right  Result Date: 06/27/2022 CLINICAL DATA:  Unwitnessed fall.  Right shoulder pain. EXAM: RIGHT SHOULDER - 2+ VIEW COMPARISON:  None Available. FINDINGS: Mild inferior glenoid degenerative osteophytosis. Mild acromioclavicular joint space narrowing and peripheral osteophytosis. No acute fracture is seen. No dislocation. The visualized portion of the right lung is unremarkable. IMPRESSION: Mild acromioclavicular and glenohumeral osteoarthritis. No acute fracture is seen. Electronically Signed   By: Yvonne Kendall M.D.   On: 06/27/2022 17:59   DG Chest 1 View  Result Date: 06/27/2022 CLINICAL DATA:  Unwitnessed fall. EXAM: CHEST  1 VIEW COMPARISON:  AP chest 01/31/2022 FINDINGS: Cardiac silhouette is again borderline enlarged. Moderate calcification is seen within the aortic arch. There is mild chronic bilateral interstitial thickening. No new focal airspace opacity. No pleural effusion pneumothorax. Moderate multilevel degenerative disc changes of the thoracic spine. IMPRESSION: 1. No acute lung process. 2. Mild  chronic bilateral interstitial thickening, likely chronic interstitial scarring. Electronically Signed   By: Yvonne Kendall M.D.   On: 06/27/2022 17:58   CT Head Wo Contrast  Result Date: 06/27/2022 CLINICAL DATA:  Fall, head trauma EXAM: CT HEAD WITHOUT CONTRAST CT MAXILLOFACIAL WITHOUT CONTRAST CT CERVICAL SPINE WITHOUT CONTRAST TECHNIQUE: Multidetector CT imaging of the head, cervical spine, and maxillofacial structures were performed using the standard protocol without intravenous contrast. Multiplanar CT image reconstructions of the cervical spine and maxillofacial structures were also generated. RADIATION DOSE REDUCTION: This exam was performed according to the departmental dose-optimization program which includes automated exposure control, adjustment of the mA and/or kV according to patient size and/or use of iterative reconstruction technique. COMPARISON:  CT head 01/31/2022 FINDINGS: CT HEAD FINDINGS Brain: Generalized atrophy. Moderate white matter hypodensity bilaterally unchanged. Chronic infarct right basal  ganglia unchanged. Negative for acute infarct, hemorrhage, mass. Vascular: Negative for hyperdense vessel Skull: Negative Other: None CT MAXILLOFACIAL FINDINGS Osseous: Negative for acute fracture. Chronic healed fracture left zygomatic arch unchanged from the prior head CT Orbits: Bilateral cataract extraction.  No orbital mass or edema. Sinuses: Nasal sinuses clear. Visualized mastoid and middle ear clear. Soft tissues: No soft tissue mass or edema in the face. CT CERVICAL SPINE FINDINGS Alignment: Mild retrolisthesis C3-4 and C5-6 Skull base and vertebrae: Negative for cervical spine fracture Soft tissues and spinal canal: Negative for soft tissue mass or edema in the neck. Disc levels: Disc degeneration and spurring most prominent C3-4 and C5-6. Mild spinal stenosis and foraminal stenosis at C3-4 and C5-6 Upper chest: Visualized lung apices clear Other: None IMPRESSION: 1. No acute  intracranial abnormality. Atrophy and chronic microvascular ischemia. 2. Negative for acute facial fracture. 3. Negative for cervical spine fracture. Electronically Signed   By: Franchot Gallo M.D.   On: 06/27/2022 17:44   CT Cervical Spine Wo Contrast  Result Date: 06/27/2022 CLINICAL DATA:  Fall, head trauma EXAM: CT HEAD WITHOUT CONTRAST CT MAXILLOFACIAL WITHOUT CONTRAST CT CERVICAL SPINE WITHOUT CONTRAST TECHNIQUE: Multidetector CT imaging of the head, cervical spine, and maxillofacial structures were performed using the standard protocol without intravenous contrast. Multiplanar CT image reconstructions of the cervical spine and maxillofacial structures were also generated. RADIATION DOSE REDUCTION: This exam was performed according to the departmental dose-optimization program which includes automated exposure control, adjustment of the mA and/or kV according to patient size and/or use of iterative reconstruction technique. COMPARISON:  CT head 01/31/2022 FINDINGS: CT HEAD FINDINGS Brain: Generalized atrophy. Moderate white matter hypodensity bilaterally unchanged. Chronic infarct right basal ganglia unchanged. Negative for acute infarct, hemorrhage, mass. Vascular: Negative for hyperdense vessel Skull: Negative Other: None CT MAXILLOFACIAL FINDINGS Osseous: Negative for acute fracture. Chronic healed fracture left zygomatic arch unchanged from the prior head CT Orbits: Bilateral cataract extraction.  No orbital mass or edema. Sinuses: Nasal sinuses clear. Visualized mastoid and middle ear clear. Soft tissues: No soft tissue mass or edema in the face. CT CERVICAL SPINE FINDINGS Alignment: Mild retrolisthesis C3-4 and C5-6 Skull base and vertebrae: Negative for cervical spine fracture Soft tissues and spinal canal: Negative for soft tissue mass or edema in the neck. Disc levels: Disc degeneration and spurring most prominent C3-4 and C5-6. Mild spinal stenosis and foraminal stenosis at C3-4 and C5-6 Upper  chest: Visualized lung apices clear Other: None IMPRESSION: 1. No acute intracranial abnormality. Atrophy and chronic microvascular ischemia. 2. Negative for acute facial fracture. 3. Negative for cervical spine fracture. Electronically Signed   By: Franchot Gallo M.D.   On: 06/27/2022 17:44   CT Maxillofacial WO CM  Result Date: 06/27/2022 CLINICAL DATA:  Fall, head trauma EXAM: CT HEAD WITHOUT CONTRAST CT MAXILLOFACIAL WITHOUT CONTRAST CT CERVICAL SPINE WITHOUT CONTRAST TECHNIQUE: Multidetector CT imaging of the head, cervical spine, and maxillofacial structures were performed using the standard protocol without intravenous contrast. Multiplanar CT image reconstructions of the cervical spine and maxillofacial structures were also generated. RADIATION DOSE REDUCTION: This exam was performed according to the departmental dose-optimization program which includes automated exposure control, adjustment of the mA and/or kV according to patient size and/or use of iterative reconstruction technique. COMPARISON:  CT head 01/31/2022 FINDINGS: CT HEAD FINDINGS Brain: Generalized atrophy. Moderate white matter hypodensity bilaterally unchanged. Chronic infarct right basal ganglia unchanged. Negative for acute infarct, hemorrhage, mass. Vascular: Negative for hyperdense vessel Skull: Negative Other: None CT MAXILLOFACIAL  FINDINGS Osseous: Negative for acute fracture. Chronic healed fracture left zygomatic arch unchanged from the prior head CT Orbits: Bilateral cataract extraction.  No orbital mass or edema. Sinuses: Nasal sinuses clear. Visualized mastoid and middle ear clear. Soft tissues: No soft tissue mass or edema in the face. CT CERVICAL SPINE FINDINGS Alignment: Mild retrolisthesis C3-4 and C5-6 Skull base and vertebrae: Negative for cervical spine fracture Soft tissues and spinal canal: Negative for soft tissue mass or edema in the neck. Disc levels: Disc degeneration and spurring most prominent C3-4 and C5-6.  Mild spinal stenosis and foraminal stenosis at C3-4 and C5-6 Upper chest: Visualized lung apices clear Other: None IMPRESSION: 1. No acute intracranial abnormality. Atrophy and chronic microvascular ischemia. 2. Negative for acute facial fracture. 3. Negative for cervical spine fracture. Electronically Signed   By: Franchot Gallo M.D.   On: 06/27/2022 17:44    EKG: Independently reviewed.  Rhythm irregular, rate 96.  QTc 411.  P waves intermittently present.  RBBB.  Assessment/Plan Principal Problem:   Wrist fracture Active Problems:   Fall   Atrial fibrillation ([place])   Dementia ([facility])   HTN (hypertension)  Assessment and Plan: * Wrist fracture Status post fall.  With open wound.  X-ray showing acute mildly comminuted mildly displaced fracture of distal radial metaphysis, also mildly displaced oblique fracture of the distal ulna metaphysis. - EDP talked to Dr. Aline Brochure, will see in consult tomorrow, likely surgery 10/20 -Hold Eliquis - N/s + 20 kcl 75cc/hr x 20hrs -IV fentanyl 25 mg every 4 hours as needed -IV cefazolin q8h started  Fall Appears to be mechanical fall.  Patient reports multiple falls.  Resides at an [facility].  Head and cervical CT negative for acute abnormality.  Right shoulder and elbow x-ray also unremarkable.  Maxillofacial CT also unremarkable. -Will need PT eval prior to discharge  HTN (hypertension) Elevated.  Systolic 563J to 497W. -Hold HCTZ and Lasix for now while hydrating -As needed hydralazine  Dementia ([facility]) Stable. -Resume Seroquel, paroxetine  Atrial fibrillation ([facility]) Rate controlled, not on rate limiting medication and on anticoagulation with Eliquis -Hold Eliquis pending surgery   DVT prophylaxis: SCDS Code Status: FULL Family Communication: None at bedside Disposition Plan: ~ 2 days Consults called:  Ortho Admission status: Inpt Tele I certify that at the point of admission it is my clinical judgment that the patient will require inpatient  hospital care spanning beyond 2 midnights from the point of admission due to high intensity of service, high risk for further deterioration and high frequency of surveillance required.  Author: Bethena Roys, MD 06/27/2022 10:10 PM  For on call review [email or web address].

## 2022-06-27 NOTE — H&P (View-Only) (Signed)
Reason for Consult: Fracture right wrist Referring Physician: Dr. Berle Mull  Christie Nelson is an 88 y.o. female.  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  She is somewhat sedated on fentanyl.  The history was obtained from Dr. Alda Ponder ski through direct communication.  The fracture was treated in the emergency room with splinting and IV antibiotics less than 6 hours from injury  Past Medical History:  Diagnosis Date  . Arthritis   . GERD (gastroesophageal reflux disease)   . Hypertension   . Prolactinoma ([place])   . Stroke [facility])     Past Surgical History:  Procedure Laterality Date  . ABDOMINAL HYSTERECTOMY  yrs ago  . CATARACT EXTRACTION W/ INTRAOCULAR LENS  IMPLANT, BILATERAL  2008   both eyes done  . RECTOCELE REPAIR  yrs ago  . right hip arthroplasty  2003  . TONSILLECTOMY  age 65  . TOTAL HIP ARTHROPLASTY  09/18/2011   Procedure: TOTAL HIP ARTHROPLASTY ANTERIOR APPROACH;  Surgeon: Mauri Pole;  Location: [facility];  Service: Orthopedics;  Laterality: Left;    History reviewed. No pertinent family history.  Social History:  reports that she quit smoking about 34 years ago. Her smoking use included cigarettes. She has a 7.50 pack-year smoking history. She has never used smokeless tobacco. She reports that she does not drink alcohol and does not use drugs.  Allergies: No Known Allergies  Medications:  Current Outpatient Medications  Medication Instructions  . acetaminophen (TYLENOL) 500 MG tablet 1 tablet, Oral, 3 times daily PRN  . apixaban (ELIQUIS) 5 mg, Oral, 2 times daily  . atorvastatin (LIPITOR) 10 MG tablet 1 tablet, Oral, Nightly  . Cholecalciferol (VITAMIN D3) 250 MCG (10000 UT) TABS 1 tablet, Oral, Daily   . furosemide (LASIX) 20 MG tablet Take one tab by mouth daily x 3 days, then only as needed for swelling thereafter  . gabapentin (NEURONTIN) 100 MG capsule 1 capsule, Oral, Nightly  . hydrochlorothiazide (HYDRODIURIL) 12.5 mg, Oral, Daily  . PARoxetine (PAXIL) 10 mg, Oral, Daily  . potassium chloride SA (KLOR-CON M) 20 MEQ tablet 1 tablet, Oral, Daily  . QUEtiapine (SEROQUEL) 12.5 mg, Oral, Daily at bedtime     Results for orders placed or performed during the hospital encounter of 06/27/22 (from the past 48 hour(s))  CBC with Differential/Platelet     Status: None   Collection Time: 06/27/22  4:22 PM  Result Value Ref Range   WBC 9.0 4.0 - 10.5 K/uL   RBC 4.56 3.87 - 5.11 MIL/uL   Hemoglobin 13.8 12.0 - 15.0 g/dL   HCT 41.8 36.0 - 46.0 %   MCV 91.7 80.0 - 100.0 fL   MCH 30.3 26.0 - 34.0 pg   MCHC 33.0 30.0 - 36.0 g/dL   RDW 14.4 11.5 - 15.5 %   Platelets 175 150 - 400 K/uL   nRBC 0.0 0.0 - 0.2 %   Neutrophils Relative % 80 %   Neutro Abs 7.3 1.7 - 7.7 K/uL   Lymphocytes Relative 12 %   Lymphs Abs 1.1 0.7 - 4.0 K/uL   Monocytes Relative 5 %   Monocytes Absolute 0.4 0.1 - 1.0 K/uL   Eosinophils Relative 2 %   Eosinophils Absolute 0.1 0.0 -  0.5 K/uL   Basophils Relative 1 %   Basophils Absolute 0.1 0.0 - 0.1 K/uL   Immature Granulocytes 0 %   Abs Immature Granulocytes 0.03 0.00 - 0.07 K/uL    Comment: Performed at [facility], [address], [place], [place] [address]  Comprehensive metabolic panel     Status: Abnormal   Collection Time: 06/27/22  6:00 PM  Result Value Ref Range   Sodium 139 135 - 145 mmol/L   Potassium 3.5 3.5 - 5.1 mmol/L    Comment: SLIGHT HEMOLYSIS   Chloride 105 98 - 111 mmol/L   CO2 25 22 - 32 mmol/L   Glucose, Bld 133 (H) 70 - 99 mg/dL    Comment: Glucose reference range applies only to samples taken after fasting for at least 8 hours.   BUN 18 8 - 23 mg/dL   Creatinine, Ser 1.11 (H) 0.44 - 1.00 mg/dL   Calcium 9.6 8.9 - 10.3 mg/dL   Total  Protein 7.3 6.5 - 8.1 g/dL   Albumin 4.0 3.5 - 5.0 g/dL   AST 24 15 - 41 U/L   ALT 15 0 - 44 U/L   Alkaline Phosphatase 117 38 - 126 U/L   Total Bilirubin 0.9 0.3 - 1.2 mg/dL   GFR, Estimated 48 (L) >60 mL/min    Comment: (NOTE) Calculated using the CKD-EPI Creatinine Equation (2021)    Anion gap 9 5 - 15    Comment: Performed at [facility], [address], [place], [place] [address]    DG HIPS BILAT WITH PELVIS MIN 5 VIEWS I was able to look at the x-rays of the hip on the right side this appears to be a revision total hip arthroplasty with pelvic screws and femoral cables appears to be stable.  On the left side she has a left total hip with press-fit technique which is also stable  Result Date: 06/27/2022 CLINICAL DATA:  Unwitnessed fall. EXAM: DG HIP (WITH OR WITHOUT PELVIS) 5+V BILAT COMPARISON:  AP pelvis 01/31/2022 FINDINGS: Status post bilateral total hip arthroplasty. The right-sided arthroplasty hardware appears represent area of vision, with multiple superior right acetabular cup screws, a fractured cerclage wire in the region of the right greater trochanter, and multiple cerclage wires around a moderately long right femoral stem. There are chronic changes within the right acetabulum including a likely chronic fracture line lucency within the superomedial right acetabulum. No new lucency is seen around either total hip arthroplasty to indicate hardware failure. No acute fracture or dislocation. IMPRESSION: 1. Status post bilateral total hip arthroplasty without evidence of hardware failure. 2. Chronic changes within the right acetabulum. No acute fracture is seen. Electronically Signed   By: Yvonne Kendall M.D.   On: 06/27/2022 18:11   DG Wrist 2 Views Right I was able to interpret the wrist x-rays.  She has a distal radius and distal ulnar fracture at the same level both are comminuted the overall alignment is reasonable with no significant angulation or displacement there is shortening  of both fractures  Result Date: 06/27/2022 CLINICAL DATA:  Unwitnessed fall. Right wrist bandage with blood on bandage. EXAM: RIGHT WRIST - 2 VIEW COMPARISON:  None Available. FINDINGS: There is diffuse decreased bone mineralization. Oblique, mildly comminuted fracture of the distal radial metadiaphysis. There is 3 mm lateral cortical step-off of the distal fracture component at the lateral aspect of the fracture line but no cortical step-off at the medial aspect of the fracture line. There appears to be 2 mm  volar cortical step-off on lateral view. Additional oblique acute fracture of the distal ulnar metadiaphysis. Approximately 7 mm lateral and 3 mm dorsal displacement of the distal fracture component with respect to the proximal fracture component. 4 mm ulnar positive variance. Moderate to severe radiocarpal joint space narrowing. Widening of the scapholunate interval up to 4 mm. Moderate triscaphe and thumb carpometacarpal and mild-to-moderate second through fifth carpometacarpal joint space narrowing. IMPRESSION: 1. Acute, mildly comminuted, and mildly displaced fracture of the distal radial metadiaphysis. 2. Acute, mildly displaced oblique fracture of the distal ulnar metadiaphysis. Electronically Signed   By: Yvonne Kendall M.D.   On: 06/27/2022 18:04   DG Elbow Complete Right I was able to interpret the right elbow there is no fracture or dislocation of the elbow  Result Date: 06/27/2022 CLINICAL DATA:  Unwitnessed fall.  Pain. EXAM: RIGHT ELBOW - COMPLETE 3+ VIEW COMPARISON:  None Available. FINDINGS: There is diffuse decreased bone mineralization. No definite joint effusion. No acute fracture is seen. No dislocation. Minimal degenerative spurring at the peripheral aspect of the medial trochlea and coronoid process. Minimal chronic enthesopathic change at the common extensor origin at the lateral epicondyle. IMPRESSION: 1. Within limitations of diffuse decreased bone mineralization, no acute  fracture is seen. 2. Minimal osteoarthritis. Electronically Signed   By: Yvonne Kendall M.D.   On: 06/27/2022 18:01   DG Shoulder Right my interpretation of the right shoulder is that there is no fracture of the shoulder  Result Date: 06/27/2022 CLINICAL DATA:  Unwitnessed fall.  Right shoulder pain. EXAM: RIGHT SHOULDER - 2+ VIEW COMPARISON:  None Available. FINDINGS: Mild inferior glenoid degenerative osteophytosis. Mild acromioclavicular joint space narrowing and peripheral osteophytosis. No acute fracture is seen. No dislocation. The visualized portion of the right lung is unremarkable. IMPRESSION: Mild acromioclavicular and glenohumeral osteoarthritis. No acute fracture is seen. Electronically Signed   By: Yvonne Kendall M.D.   On: 06/27/2022 17:59   DG Chest 1 View  Result Date: 06/27/2022 CLINICAL DATA:  Unwitnessed fall. EXAM: CHEST  1 VIEW COMPARISON:  AP chest 01/31/2022 FINDINGS: Cardiac silhouette is again borderline enlarged. Moderate calcification is seen within the aortic arch. There is mild chronic bilateral interstitial thickening. No new focal airspace opacity. No pleural effusion pneumothorax. Moderate multilevel degenerative disc changes of the thoracic spine. IMPRESSION: 1. No acute lung process. 2. Mild chronic bilateral interstitial thickening, likely chronic interstitial scarring. Electronically Signed   By: Yvonne Kendall M.D.   On: 06/27/2022 17:58   CT Head Wo Contrast  Result Date: 06/27/2022 CLINICAL DATA:  Fall, head trauma EXAM: CT HEAD WITHOUT CONTRAST CT MAXILLOFACIAL WITHOUT CONTRAST CT CERVICAL SPINE WITHOUT CONTRAST TECHNIQUE: Multidetector CT imaging of the head, cervical spine, and maxillofacial structures were performed using the standard protocol without intravenous contrast. Multiplanar CT image reconstructions of the cervical spine and maxillofacial structures were also generated. RADIATION DOSE REDUCTION: This exam was performed according to the departmental  dose-optimization program which includes automated exposure control, adjustment of the mA and/or kV according to patient size and/or use of iterative reconstruction technique. COMPARISON:  CT head 01/31/2022 FINDINGS: CT HEAD FINDINGS Brain: Generalized atrophy. Moderate white matter hypodensity bilaterally unchanged. Chronic infarct right basal ganglia unchanged. Negative for acute infarct, hemorrhage, mass. Vascular: Negative for hyperdense vessel Skull: Negative Other: None CT MAXILLOFACIAL FINDINGS Osseous: Negative for acute fracture. Chronic healed fracture left zygomatic arch unchanged from the prior head CT Orbits: Bilateral cataract extraction.  No orbital mass or edema. Sinuses: Nasal sinuses clear. Visualized mastoid  and middle ear clear. Soft tissues: No soft tissue mass or edema in the face. CT CERVICAL SPINE FINDINGS Alignment: Mild retrolisthesis C3-4 and C5-6 Skull base and vertebrae: Negative for cervical spine fracture Soft tissues and spinal canal: Negative for soft tissue mass or edema in the neck. Disc levels: Disc degeneration and spurring most prominent C3-4 and C5-6. Mild spinal stenosis and foraminal stenosis at C3-4 and C5-6 Upper chest: Visualized lung apices clear Other: None IMPRESSION: 1. No acute intracranial abnormality. Atrophy and chronic microvascular ischemia. 2. Negative for acute facial fracture. 3. Negative for cervical spine fracture. Electronically Signed   By: Franchot Gallo M.D.   On: 06/27/2022 17:44   CT Cervical Spine Wo Contrast, I reviewed the CT report there is no spinal fracture there is some cervical spondylosis  Result Date: 06/27/2022 CLINICAL DATA:  Fall, head trauma EXAM: CT HEAD WITHOUT CONTRAST CT MAXILLOFACIAL WITHOUT CONTRAST CT CERVICAL SPINE WITHOUT CONTRAST TECHNIQUE: Multidetector CT imaging of the head, cervical spine, and maxillofacial structures were performed using the standard protocol without intravenous contrast. Multiplanar CT image  reconstructions of the cervical spine and maxillofacial structures were also generated. RADIATION DOSE REDUCTION: This exam was performed according to the departmental dose-optimization program which includes automated exposure control, adjustment of the mA and/or kV according to patient size and/or use of iterative reconstruction technique. COMPARISON:  CT head 01/31/2022 FINDINGS: CT HEAD FINDINGS Brain: Generalized atrophy. Moderate white matter hypodensity bilaterally unchanged. Chronic infarct right basal ganglia unchanged. Negative for acute infarct, hemorrhage, mass. Vascular: Negative for hyperdense vessel Skull: Negative Other: None CT MAXILLOFACIAL FINDINGS Osseous: Negative for acute fracture. Chronic healed fracture left zygomatic arch unchanged from the prior head CT Orbits: Bilateral cataract extraction.  No orbital mass or edema. Sinuses: Nasal sinuses clear. Visualized mastoid and middle ear clear. Soft tissues: No soft tissue mass or edema in the face. CT CERVICAL SPINE FINDINGS Alignment: Mild retrolisthesis C3-4 and C5-6 Skull base and vertebrae: Negative for cervical spine fracture Soft tissues and spinal canal: Negative for soft tissue mass or edema in the neck. Disc levels: Disc degeneration and spurring most prominent C3-4 and C5-6. Mild spinal stenosis and foraminal stenosis at C3-4 and C5-6 Upper chest: Visualized lung apices clear Other: None IMPRESSION: 1. No acute intracranial abnormality. Atrophy and chronic microvascular ischemia. 2. Negative for acute facial fracture. 3. Negative for cervical spine fracture. Electronically Signed   By: Franchot Gallo M.D.   On: 06/27/2022 17:44   CT Maxillofacial WO CM  Result Date: 06/27/2022 CLINICAL DATA:  Fall, head trauma EXAM: CT HEAD WITHOUT CONTRAST CT MAXILLOFACIAL WITHOUT CONTRAST CT CERVICAL SPINE WITHOUT CONTRAST TECHNIQUE: Multidetector CT imaging of the head, cervical spine, and maxillofacial structures were performed using the  standard protocol without intravenous contrast. Multiplanar CT image reconstructions of the cervical spine and maxillofacial structures were also generated. RADIATION DOSE REDUCTION: This exam was performed according to the departmental dose-optimization program which includes automated exposure control, adjustment of the mA and/or kV according to patient size and/or use of iterative reconstruction technique. COMPARISON:  CT head 01/31/2022 FINDINGS: CT HEAD FINDINGS Brain: Generalized atrophy. Moderate white matter hypodensity bilaterally unchanged. Chronic infarct right basal ganglia unchanged. Negative for acute infarct, hemorrhage, mass. Vascular: Negative for hyperdense vessel Skull: Negative Other: None CT MAXILLOFACIAL FINDINGS Osseous: Negative for acute fracture. Chronic healed fracture left zygomatic arch unchanged from the prior head CT Orbits: Bilateral cataract extraction.  No orbital mass or edema. Sinuses: Nasal sinuses clear. Visualized mastoid and middle ear clear.  Soft tissues: No soft tissue mass or edema in the face. CT CERVICAL SPINE FINDINGS Alignment: Mild retrolisthesis C3-4 and C5-6 Skull base and vertebrae: Negative for cervical spine fracture Soft tissues and spinal canal: Negative for soft tissue mass or edema in the neck. Disc levels: Disc degeneration and spurring most prominent C3-4 and C5-6. Mild spinal stenosis and foraminal stenosis at C3-4 and C5-6 Upper chest: Visualized lung apices clear Other: None IMPRESSION: 1. No acute intracranial abnormality. Atrophy and chronic microvascular ischemia. 2. Negative for acute facial fracture. 3. Negative for cervical spine fracture. Electronically Signed   By: Franchot Gallo M.D.   On: 06/27/2022 17:44    Review of Systems  Unable to perform ROS: Acuity of condition  Blood pressure (!) 191/90, pulse (!) 36, temperature 98.2 F (36.8 C), temperature source Oral, resp. rate 18, height '5\' 6"'$  (1.676 m), weight 68 kg, SpO2 100 %. Physical  Exam  I find the patient lying in bed she is easily arousable.  Her appearance no gross abnormalities she seems very sleepy.  She is oriented to person, seem to know that she was in the hospital but not oriented to time her mood was pleasant her affect was somnolent  She was lying in bed ambulatory status unclear  The lower extremities showed no subluxation dislocation malalignment tremor or skin lesion and pulses were intact  The left upper extremity on inspection looked normal with normal range of motion all joints reduced and stable normal muscle tone without tremor and normal skin pulse intact  On the right side the right upper extremity was in a splint to capillary refill look good this visible skin had some bruising she has a laceration I cannot see it I am not taking the splint down until surgery strength assessment was deferred as was stability although the x-ray shows no dislocation of the wrist and range of motion was deferred as well overall overall alignment on x-ray was without major deformity.  Capillary refill as stated intact  Lymph nodes right axilla normal gross sensation intact all 4 extremities.  Toes downgoing without pathologic reflexes DTRs in the lower extremity normal coordination and balance deferred Assessment/Plan: This is an 88 year old female both bones fracture right distal radius and ulna.  The patient has been given antibiotics within 6 hours of admission as there is a laceration over the right wrist which was bleeding on admission.  The patient is on Eliquis.  We will continue the IV antibiotics and splinting and then schedule her for surgery after 24 hours of stopping the Eliquis.  Plan is for open reduction internal fixation right wrist  Arther Abbott 06/27/2022, 7:59 PM

## 2022-06-27 NOTE — Consult Note (Addendum)
Reason for Consult:*** Referring Physician: ***  Christie Nelson is an 88 y.o. female.  HPI: ***  Past Medical History:  Diagnosis Date   Arthritis    GERD (gastroesophageal reflux disease)    Hypertension    Prolactinoma (HCC)    Stroke [facility])     Past Surgical History:  Procedure Laterality Date   ABDOMINAL HYSTERECTOMY  yrs ago   CATARACT EXTRACTION W/ INTRAOCULAR LENS  IMPLANT, BILATERAL  2008   both eyes done   RECTOCELE REPAIR  yrs ago   right hip arthroplasty  2003   TONSILLECTOMY  age 8   TOTAL HIP ARTHROPLASTY  09/18/2011   Procedure: TOTAL HIP ARTHROPLASTY ANTERIOR APPROACH;  Surgeon: Shelda Pal;  Location: [facility];  Service: Orthopedics;  Laterality: Left;    History reviewed. No pertinent family history.  Social History:  reports that she quit smoking about 34 years ago. Her smoking use included cigarettes. She has a 7.50 pack-year smoking history. She has never used smokeless tobacco. She reports that she does not drink alcohol and does not use drugs.  Allergies: No Known Allergies  Medications: {medication reviewed/display:3041432}  Results for orders placed or performed during the hospital encounter of 06/27/22 (from the past 48 hour(s))  CBC with Differential/Platelet     Status: None   Collection Time: 06/27/22  4:22 PM  Result Value Ref Range   WBC 9.0 4.0 - 10.5 K/uL   RBC 4.56 3.87 - 5.11 MIL/uL   Hemoglobin 13.8 12.0 - 15.0 g/dL   HCT 16.1 09.6 - 04.5 %   MCV 91.7 80.0 - 100.0 fL   MCH 30.3 26.0 - 34.0 pg   MCHC 33.0 30.0 - 36.0 g/dL   RDW 40.9 81.1 - 91.4 %   Platelets 175 150 - 400 K/uL   nRBC 0.0 0.0 - 0.2 %   Neutrophils Relative % 80 %   Neutro Abs 7.3 1.7 - 7.7 K/uL   Lymphocytes Relative 12 %   Lymphs Abs 1.1 0.7 - 4.0 K/uL   Monocytes Relative 5 %   Monocytes Absolute 0.4 0.1 - 1.0 K/uL   Eosinophils Relative 2 %   Eosinophils Absolute 0.1 0.0 - 0.5 K/uL   Basophils Relative 1 %   Basophils Absolute 0.1 0.0 - 0.1 K/uL    Immature Granulocytes 0 %   Abs Immature Granulocytes 0.03 0.00 - 0.07 K/uL    Comment: Performed at [facility], [address] [place], [place] [address]  Comprehensive metabolic panel     Status: Abnormal   Collection Time: 06/27/22  6:00 PM  Result Value Ref Range   Sodium 139 135 - 145 mmol/L   Potassium 3.5 3.5 - 5.1 mmol/L    Comment: SLIGHT HEMOLYSIS   Chloride 105 98 - 111 mmol/L   CO2 25 22 - 32 mmol/L   Glucose, Bld 133 (H) 70 - 99 mg/dL    Comment: Glucose reference range applies only to samples taken after fasting for at least 8 hours.   BUN 18 8 - 23 mg/dL   Creatinine, Ser 6.21 (H) 0.44 - 1.00 mg/dL   Calcium 9.6 8.9 - 30.8 mg/dL   Total Protein 7.3 6.5 - 8.1 g/dL   Albumin 4.0 3.5 - 5.0 g/dL   AST 24 15 - 41 U/L   ALT 15 0 - 44 U/L   Alkaline Phosphatase 117 38 - 126 U/L   Total Bilirubin 0.9 0.3 - 1.2 mg/dL   GFR, Estimated 48 (L) >60 mL/min  Comment: (NOTE) Calculated using the CKD-EPI Creatinine Equation (2021)    Anion gap 9 5 - 15    Comment: Performed at [facility], [address], [place], [place] [address]    DG HIPS BILAT WITH PELVIS MIN 5 VIEWS  Result Date: 06/27/2022 CLINICAL DATA:  Unwitnessed fall. EXAM: DG HIP (WITH OR WITHOUT PELVIS) 5+V BILAT COMPARISON:  AP pelvis 01/31/2022 FINDINGS: Status post bilateral total hip arthroplasty. The right-sided arthroplasty hardware appears represent area of vision, with multiple superior right acetabular cup screws, a fractured cerclage wire in the region of the right greater trochanter, and multiple cerclage wires around a moderately long right femoral stem. There are chronic changes within the right acetabulum including a likely chronic fracture line lucency within the superomedial right acetabulum. No new lucency is seen around either total hip arthroplasty to indicate hardware failure. No acute fracture or dislocation. IMPRESSION: 1. Status post bilateral total hip arthroplasty without evidence of hardware  failure. 2. Chronic changes within the right acetabulum. No acute fracture is seen. Electronically Signed   By: Neita Garnet M.D.   On: 06/27/2022 18:11   DG Wrist 2 Views Right  Result Date: 06/27/2022 CLINICAL DATA:  Unwitnessed fall. Right wrist bandage with blood on bandage. EXAM: RIGHT WRIST - 2 VIEW COMPARISON:  None Available. FINDINGS: There is diffuse decreased bone mineralization. Oblique, mildly comminuted fracture of the distal radial metadiaphysis. There is 3 mm lateral cortical step-off of the distal fracture component at the lateral aspect of the fracture line but no cortical step-off at the medial aspect of the fracture line. There appears to be 2 mm volar cortical step-off on lateral view. Additional oblique acute fracture of the distal ulnar metadiaphysis. Approximately 7 mm lateral and 3 mm dorsal displacement of the distal fracture component with respect to the proximal fracture component. 4 mm ulnar positive variance. Moderate to severe radiocarpal joint space narrowing. Widening of the scapholunate interval up to 4 mm. Moderate triscaphe and thumb carpometacarpal and mild-to-moderate second through fifth carpometacarpal joint space narrowing. IMPRESSION: 1. Acute, mildly comminuted, and mildly displaced fracture of the distal radial metadiaphysis. 2. Acute, mildly displaced oblique fracture of the distal ulnar metadiaphysis. Electronically Signed   By: Neita Garnet M.D.   On: 06/27/2022 18:04   DG Elbow Complete Right  Result Date: 06/27/2022 CLINICAL DATA:  Unwitnessed fall.  Pain. EXAM: RIGHT ELBOW - COMPLETE 3+ VIEW COMPARISON:  None Available. FINDINGS: There is diffuse decreased bone mineralization. No definite joint effusion. No acute fracture is seen. No dislocation. Minimal degenerative spurring at the peripheral aspect of the medial trochlea and coronoid process. Minimal chronic enthesopathic change at the common extensor origin at the lateral epicondyle. IMPRESSION: 1.  Within limitations of diffuse decreased bone mineralization, no acute fracture is seen. 2. Minimal osteoarthritis. Electronically Signed   By: Neita Garnet M.D.   On: 06/27/2022 18:01   DG Shoulder Right  Result Date: 06/27/2022 CLINICAL DATA:  Unwitnessed fall.  Right shoulder pain. EXAM: RIGHT SHOULDER - 2+ VIEW COMPARISON:  None Available. FINDINGS: Mild inferior glenoid degenerative osteophytosis. Mild acromioclavicular joint space narrowing and peripheral osteophytosis. No acute fracture is seen. No dislocation. The visualized portion of the right lung is unremarkable. IMPRESSION: Mild acromioclavicular and glenohumeral osteoarthritis. No acute fracture is seen. Electronically Signed   By: Neita Garnet M.D.   On: 06/27/2022 17:59   DG Chest 1 View  Result Date: 06/27/2022 CLINICAL DATA:  Unwitnessed fall. EXAM: CHEST  1 VIEW COMPARISON:  AP  chest 01/31/2022 FINDINGS: Cardiac silhouette is again borderline enlarged. Moderate calcification is seen within the aortic arch. There is mild chronic bilateral interstitial thickening. No new focal airspace opacity. No pleural effusion pneumothorax. Moderate multilevel degenerative disc changes of the thoracic spine. IMPRESSION: 1. No acute lung process. 2. Mild chronic bilateral interstitial thickening, likely chronic interstitial scarring. Electronically Signed   By: Neita Garnet M.D.   On: 06/27/2022 17:58   CT Head Wo Contrast  Result Date: 06/27/2022 CLINICAL DATA:  Fall, head trauma EXAM: CT HEAD WITHOUT CONTRAST CT MAXILLOFACIAL WITHOUT CONTRAST CT CERVICAL SPINE WITHOUT CONTRAST TECHNIQUE: Multidetector CT imaging of the head, cervical spine, and maxillofacial structures were performed using the standard protocol without intravenous contrast. Multiplanar CT image reconstructions of the cervical spine and maxillofacial structures were also generated. RADIATION DOSE REDUCTION: This exam was performed according to the departmental dose-optimization  program which includes automated exposure control, adjustment of the mA and/or kV according to patient size and/or use of iterative reconstruction technique. COMPARISON:  CT head 01/31/2022 FINDINGS: CT HEAD FINDINGS Brain: Generalized atrophy. Moderate white matter hypodensity bilaterally unchanged. Chronic infarct right basal ganglia unchanged. Negative for acute infarct, hemorrhage, mass. Vascular: Negative for hyperdense vessel Skull: Negative Other: None CT MAXILLOFACIAL FINDINGS Osseous: Negative for acute fracture. Chronic healed fracture left zygomatic arch unchanged from the prior head CT Orbits: Bilateral cataract extraction.  No orbital mass or edema. Sinuses: Nasal sinuses clear. Visualized mastoid and middle ear clear. Soft tissues: No soft tissue mass or edema in the face. CT CERVICAL SPINE FINDINGS Alignment: Mild retrolisthesis C3-4 and C5-6 Skull base and vertebrae: Negative for cervical spine fracture Soft tissues and spinal canal: Negative for soft tissue mass or edema in the neck. Disc levels: Disc degeneration and spurring most prominent C3-4 and C5-6. Mild spinal stenosis and foraminal stenosis at C3-4 and C5-6 Upper chest: Visualized lung apices clear Other: None IMPRESSION: 1. No acute intracranial abnormality. Atrophy and chronic microvascular ischemia. 2. Negative for acute facial fracture. 3. Negative for cervical spine fracture. Electronically Signed   By: Marlan Palau M.D.   On: 06/27/2022 17:44   CT Cervical Spine Wo Contrast  Result Date: 06/27/2022 CLINICAL DATA:  Fall, head trauma EXAM: CT HEAD WITHOUT CONTRAST CT MAXILLOFACIAL WITHOUT CONTRAST CT CERVICAL SPINE WITHOUT CONTRAST TECHNIQUE: Multidetector CT imaging of the head, cervical spine, and maxillofacial structures were performed using the standard protocol without intravenous contrast. Multiplanar CT image reconstructions of the cervical spine and maxillofacial structures were also generated. RADIATION DOSE REDUCTION:  This exam was performed according to the departmental dose-optimization program which includes automated exposure control, adjustment of the mA and/or kV according to patient size and/or use of iterative reconstruction technique. COMPARISON:  CT head 01/31/2022 FINDINGS: CT HEAD FINDINGS Brain: Generalized atrophy. Moderate white matter hypodensity bilaterally unchanged. Chronic infarct right basal ganglia unchanged. Negative for acute infarct, hemorrhage, mass. Vascular: Negative for hyperdense vessel Skull: Negative Other: None CT MAXILLOFACIAL FINDINGS Osseous: Negative for acute fracture. Chronic healed fracture left zygomatic arch unchanged from the prior head CT Orbits: Bilateral cataract extraction.  No orbital mass or edema. Sinuses: Nasal sinuses clear. Visualized mastoid and middle ear clear. Soft tissues: No soft tissue mass or edema in the face. CT CERVICAL SPINE FINDINGS Alignment: Mild retrolisthesis C3-4 and C5-6 Skull base and vertebrae: Negative for cervical spine fracture Soft tissues and spinal canal: Negative for soft tissue mass or edema in the neck. Disc levels: Disc degeneration and spurring most prominent C3-4 and C5-6. Mild spinal  stenosis and foraminal stenosis at C3-4 and C5-6 Upper chest: Visualized lung apices clear Other: None IMPRESSION: 1. No acute intracranial abnormality. Atrophy and chronic microvascular ischemia. 2. Negative for acute facial fracture. 3. Negative for cervical spine fracture. Electronically Signed   By: Marlan Palau M.D.   On: 06/27/2022 17:44   CT Maxillofacial WO CM  Result Date: 06/27/2022 CLINICAL DATA:  Fall, head trauma EXAM: CT HEAD WITHOUT CONTRAST CT MAXILLOFACIAL WITHOUT CONTRAST CT CERVICAL SPINE WITHOUT CONTRAST TECHNIQUE: Multidetector CT imaging of the head, cervical spine, and maxillofacial structures were performed using the standard protocol without intravenous contrast. Multiplanar CT image reconstructions of the cervical spine and  maxillofacial structures were also generated. RADIATION DOSE REDUCTION: This exam was performed according to the departmental dose-optimization program which includes automated exposure control, adjustment of the mA and/or kV according to patient size and/or use of iterative reconstruction technique. COMPARISON:  CT head 01/31/2022 FINDINGS: CT HEAD FINDINGS Brain: Generalized atrophy. Moderate white matter hypodensity bilaterally unchanged. Chronic infarct right basal ganglia unchanged. Negative for acute infarct, hemorrhage, mass. Vascular: Negative for hyperdense vessel Skull: Negative Other: None CT MAXILLOFACIAL FINDINGS Osseous: Negative for acute fracture. Chronic healed fracture left zygomatic arch unchanged from the prior head CT Orbits: Bilateral cataract extraction.  No orbital mass or edema. Sinuses: Nasal sinuses clear. Visualized mastoid and middle ear clear. Soft tissues: No soft tissue mass or edema in the face. CT CERVICAL SPINE FINDINGS Alignment: Mild retrolisthesis C3-4 and C5-6 Skull base and vertebrae: Negative for cervical spine fracture Soft tissues and spinal canal: Negative for soft tissue mass or edema in the neck. Disc levels: Disc degeneration and spurring most prominent C3-4 and C5-6. Mild spinal stenosis and foraminal stenosis at C3-4 and C5-6 Upper chest: Visualized lung apices clear Other: None IMPRESSION: 1. No acute intracranial abnormality. Atrophy and chronic microvascular ischemia. 2. Negative for acute facial fracture. 3. Negative for cervical spine fracture. Electronically Signed   By: Marlan Palau M.D.   On: 06/27/2022 17:44    Review of Systems Blood pressure (!) 191/90, pulse (!) 36, temperature 98.2 F (36.8 C), temperature source Oral, resp. rate 18, height 5\' 6"  (1.676 m), weight 68 kg, SpO2 100 %. Physical Exam  Assessment/Plan: ***  Fuller Canada 06/27/2022, 7:59 PM

## 2022-06-27 NOTE — Assessment & Plan Note (Signed)
Rate controlled, not on rate limiting medication and on anticoagulation with Eliquis -Hold Eliquis pending surgery

## 2022-06-27 NOTE — Progress Notes (Signed)
Patient ID: Christie Nelson, female   DOB: [birth year], 88 y.o.   MRN: [id number]   Antibiotics have been given within 6 hrs.  The px is on eloquis, she will need to wait for surgery until Friday

## 2022-06-27 NOTE — ED Notes (Signed)
Per MD Emokpae at bedside, apply vasoline guaze and then abd pad to control bleeding and to keep gauze from sticking to wound.  Patient's arm splinted after application of gauze without difficulty, patient tolerated well.

## 2022-06-27 NOTE — ED Notes (Signed)
Repaged Dr Aline Brochure to Dr Rogene Houston @ [phone]. Also called cell# no answer.

## 2022-06-27 NOTE — Assessment & Plan Note (Addendum)
Elevated.  Systolic 980O to 123N. -Hold HCTZ and Lasix for now while hydrating -As needed hydralazine

## 2022-06-27 NOTE — ED Notes (Signed)
AC made aware of need for ancef from pharmacy.

## 2022-06-27 NOTE — ED Notes (Signed)
Patient to CT.

## 2022-06-28 DIAGNOSIS — S62101B Fracture of unspecified carpal bone, right wrist, initial encounter for open fracture: Secondary | ICD-10-CM | POA: Diagnosis not present

## 2022-06-28 MED ORDER — POVIDONE-IODINE 10 % EX SWAB: 2.0000 | Freq: Once | CUTANEOUS | Status: DC

## 2022-06-28 MED ORDER — HYDROCODONE-ACETAMINOPHEN 5-325 MG PO TABS
1.0000 | ORAL_TABLET | ORAL | Status: DC | PRN
  Administered 2022-06-28 – 2022-06-30 (×2): 1 via ORAL
  Filled 2022-06-28 (×3): qty 1

## 2022-06-28 MED ORDER — CEFAZOLIN SODIUM-DEXTROSE 2-4 GM/100ML-% IV SOLN
2.0000 g | Freq: Three times a day (TID) | INTRAVENOUS | Status: DC
  Administered 2022-06-28 – 2022-07-02 (×11): 2 g via INTRAVENOUS
  Filled 2022-06-28 (×12): qty 100

## 2022-06-28 MED ORDER — ENSURE ENLIVE PO LIQD: 237.0000 mL | Freq: Two times a day (BID) | ORAL | Status: DC

## 2022-06-28 MED ORDER — FENTANYL CITRATE PF 50 MCG/ML IJ SOSY
25.0000 ug | PREFILLED_SYRINGE | INTRAMUSCULAR | Status: DC | PRN
  Administered 2022-06-29: 25 ug via INTRAVENOUS
  Filled 2022-06-28: qty 1

## 2022-06-28 MED ORDER — CHLORHEXIDINE GLUCONATE 4 % EX LIQD
60.0000 mL | Freq: Once | CUTANEOUS | Status: AC
  Administered 2022-06-29: 4 via TOPICAL
  Filled 2022-06-28: qty 15

## 2022-06-28 MED ORDER — CEFAZOLIN SODIUM-DEXTROSE 2-4 GM/100ML-% IV SOLN
2.0000 g | INTRAVENOUS | Status: AC
  Administered 2022-06-29: 2 g via INTRAVENOUS
  Filled 2022-06-28: qty 100

## 2022-06-28 NOTE — Progress Notes (Signed)
Fentanyl x 2 this shift for pain control with good relief on PAINAD scale.  No urine output overnight.  Scanned for 500.  Orders for in and out, cath performed with 750 ml urine output returned.  No bleeding from splint site.  Fingers warm, edema decreased overnight with extremity elevated.

## 2022-06-28 NOTE — Progress Notes (Signed)
Initial Nutrition Assessment  DOCUMENTATION CODES:   Not applicable  INTERVENTION:  Ensure Enlive po BID, each supplement provides 350 kcal and 20 grams of protein. Magic cup TID with meals, each supplement provides 290 kcal and 9 grams of protein  NUTRITION DIAGNOSIS:   Increased nutrient needs related to other (see comment) (wrist fracture) as evidenced by estimated needs.  GOAL:   Patient will meet greater than or equal to 90% of their needs  MONITOR:   PO intake, Supplement acceptance, Labs, Weight trends  REASON FOR ASSESSMENT:   Malnutrition Screening Tool    ASSESSMENT:   Pt from [facility] after a fall leading to R wrist fx. PMH significant for dementia, stroke and HTN.  Pending Ortho consult. Noted plans for likely ORIF tomorrow.   Unsuccessful attempt to reach pt via phone call to room. Unable to obtain detailed nutrition related history at this time.   Pt would likely benefit from the addition of nutrition supplements to support pre-op nutrition optimization and post-op healing.   Meal completions: 10/19: 0% breakfast, 30% lunch  Reviewed weight history. It does not appear that pt has had significant recent weight loss.   Medications reviewed and include IV NaCl with KCl '@75ml'$ /hr, abx   Labs: reviewed  NUTRITION - FOCUSED PHYSICAL EXAM: RD working remotely. Deferred to follow up.   Diet Order:   Diet Order             Diet NPO time specified  Diet effective midnight           DIET DYS 3 Room service appropriate? Yes; Fluid consistency: Thin  Diet effective now                   EDUCATION NEEDS:   No education needs have been identified at this time  Skin:  Skin Assessment: Reviewed RN Assessment  Last BM:  PTA  Height:   Ht Readings from Last 1 Encounters:  06/27/22 '5\' 6"'$  (1.676 m)    Weight:   Wt Readings from Last 1 Encounters:  06/27/22 67 kg   BMI:  Body mass index is 23.84 kg/m.  Estimated Nutritional Needs:    Kcal:  1450-1650  Protein:  75-90g  Fluid:  1.5-1.7L  Clayborne Dana, RDN, LDN Clinical Nutrition

## 2022-06-28 NOTE — Progress Notes (Signed)
Progress Note Patient: Christie Nelson FXT:[id number] DOB: [birth year] DOA: 06/27/2022  DOS: the patient was seen and examined on 06/28/2022  Brief hospital course: 88 year old female with PMH of dementia, CVA, HTN, chronic anticoagulation presented to the hospital from ALF with unwitnessed fall.  Found to have right wrist comminuted and displaced fracture of the distal radial metadiaphysis.  Also oblique fracture of distal third diaphysis.  Orthopedic consulted. Patient has acute delirium most likely secondary to pain and pain medication.  Prognosis guarded. Assessment and Plan: Right wrist fracture. Open wound. Orthopedic consulted. Currently holding chronic and ventilation. Surgery scheduled on 10/20. We will continue with IV fluids. Patient currently on IV antibiotics.  Preop clearance. Patient with past medical history of dementia as well as A-fib on chronic anticoagulation. Presented with unwitnessed fall. Currently does not appear to have any prohibitive risk factor that requires further work-up although per Lyndel Safe score, patient has 1.9 % Risk of myocardial infarction or cardiac arrest, intraoperatively or up to 30 days post-op. She remains also at high risk for delirium postoperatively. Currently has some retention and poor p.o. intake and thus remains at high risk for AKI as well. This was discussed with son.  Currently verbalized understanding.  Dementia with delirium. Patient has severe dementia.  Currently on Seroquel and Paxil Does not appear to have any behavioral issues although unable to follow commands consistently. This is mostly delirium in the setting of pain and pain medication. This potentially can get worse after anesthesia as well as procedure. Is also given preclude her from eating and drinking adequately which will lead to poor recovery.  Goals of care conversation. Discussed with patient's son who has a POA. Patient is not in a condition to provide any  information. Patient may have a living will which may have DNR on it for her son. Per my discussion with patient and son patient's prognosis is poor. Son does not want her to be on life support or does not want to break her ribs with CPR. Currently patient will be transitioned to DNR based on this conversation and family is wishes.  Acute retention. In and out catheterization x1. If he requires 3 catheterization I would rather put in a Foley catheter.  Unwitnessed fall. Etiology not clear. Potentially mechanical fall. CT head and CT C-spine unremarkable. Right shoulder and elbow x-ray also negative for any acute abnormality. Negative facial x-rays negative for any acute fracture as well. PT OT consultation. Continue to monitor on telemetry.  HTN. Blood pressure elevated. Patient is on diuretics are currently on hold. Monitor.  Chronic A-fib. Currently rate controlled. Patient is not on any rate control medication.  Was on Eliquis. Currently Eliquis on hold.  Subjective: Drowsy and lethargic although able to wake easily.  Denies any acute complaint other than pain on the right wrist.  No nausea no vomiting no chest pain or shortness of breath.  Repeating same answer again and again regardless of question asked.  Physical Exam: Vitals:   06/28/22 0524 06/28/22 1044 06/28/22 1401 06/28/22 1431  BP: (!) 157/77 (!) 151/81 (!) 177/76 (!) 160/78  Pulse: 72 72 73   Resp: '20 18 17   '$ Temp: 98.5 F (36.9 C) 98.7 F (37.1 C) 98.7 F (37.1 C)   TempSrc: Oral     SpO2: 97% 97% 96%   Weight:      Height:       General: Appear in mild distress; no visible Abnormal Neck Mass Or lumps, Conjunctiva normal Cardiovascular: S1 and  S2 Present, no Murmur, Respiratory: good respiratory effort, Bilateral Air entry present and CTA, no Crackles, no wheezes Abdomen: Bowel Sound present, Non tender  Extremities: no Pedal edema Neurology: alert and not oriented to time, place, and person unable  to follow commands, no focal deficit. Gait not checked due to patient safety concerns   Data Reviewed: I have Reviewed nursing notes, Vitals, and Lab results since pt's last encounter. Pertinent lab results CBC and BMP I have ordered test including CBC and BMP    Family Communication: Discussed with son at bedside  Disposition: Status is: Inpatient Remains inpatient appropriate because: Need surgical correction of distal radius fracture and need to monitor postop recovery SCDs Start: 06/27/22 2257   Level of care: Telemetry Telemetry reviewed, shows Atrial fib without RVR and Continue Telemetry due to atrial fibrillation   Author: Berle Mull, MD 06/28/2022 6:00 PM Please look on [email or web address] to find out who is on call.

## 2022-06-28 NOTE — TOC Initial Note (Signed)
Transition of Care [facility]) - Initial/Assessment Note    Patient Details  Name: Christie Nelson MRN: [id number] Date of Birth: [birth year]  Transition of Care [facility]) CM/SW Contact:    Shade Flood, LCSW Phone Number: 06/28/2022, 1:54 PM  Clinical Narrative:                  Pt admitted from [place]. Received TOC consult stating pt may need SNF after surgery. Spoke with Juliann Pulse at [facility] to update on pt's status. 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 She states that staff do assist her with ADLs and if pt returns directly to the ALF, they can increase the amount of assistance they provide. Juliann Pulse expects pt would do better returning to the ALF rather than going to SNF due to her dementia.  TOC will follow and continue to assess and assist with dc planning.  Expected Discharge Plan: Assisted Living Barriers to Discharge: Continued Medical Work up   Patient Goals and CMS Choice Patient states their goals for this hospitalization and ongoing recovery are:: go home      Expected Discharge Plan and Services Expected Discharge Plan: Assisted Living In-house Referral: Clinical Social Work     Living arrangements for the past 2 months: [place]                                      Prior Living Arrangements/Services Living arrangements for the past 2 months: [place] Lives with:: Facility Resident Patient language and need for interpreter reviewed:: Yes Do you feel safe going back to the place where you live?: Yes      Need for Family Participation in Patient Care: No (Comment) Care giver support system in place?: Yes (comment)   Criminal Activity/Legal Involvement Pertinent to Current Situation/Hospitalization: No - Comment as needed  Activities of Daily Living   ADL Screening (condition at time of admission) Patient's cognitive ability adequate to safely complete daily activities?: No Is the patient deaf  or have difficulty hearing?: Yes Does the patient have difficulty seeing, even when wearing glasses/contacts?: No Does the patient have difficulty concentrating, remembering, or making decisions?: Yes Patient able to express need for assistance with ADLs?: Yes Does the patient have difficulty dressing or bathing?: Yes Independently performs ADLs?: No Communication: Needs assistance Is this a change from baseline?: Pre-admission baseline Dressing (OT): Dependent Is this a change from baseline?: Pre-admission baseline Grooming: Needs assistance Is this a change from baseline?: Pre-admission baseline Feeding: Dependent Is this a change from baseline?: Pre-admission baseline Bathing: Dependent Is this a change from baseline?: Pre-admission baseline Toileting: Dependent Is this a change from baseline?: Pre-admission baseline In/Out Bed: Dependent Is this a change from baseline?: Pre-admission baseline Walks in Home: Dependent Is this a change from baseline?: Pre-admission baseline Does the patient have difficulty walking or climbing stairs?: Yes Weakness of Legs: Both Weakness of Arms/Hands: Both  Permission Sought/Granted                  Emotional Assessment       Orientation: : Oriented to Self Alcohol / Substance Use: Not Applicable Psych Involvement: No (comment)  Admission diagnosis:  Wrist fracture [S62.109A] Fall, initial encounter [W19.XXXA] Type I or II open fracture of distal end of right radius with ulna, initial encounter [S52.501B, S52.601B] Type I or II open fracture of distal end of  right ulna, unspecified fracture morphology, initial encounter [S52.601B] Patient Active Problem List   Diagnosis Date Noted   Wrist fracture 06/27/2022   Atrial fibrillation ([place]) 06/27/2022   Fall 06/27/2022   Dementia ([place]) 06/27/2022   HTN (hypertension) 06/27/2022   S/P left hip replacement 09/18/2011   PCP:  Bonnita Nasuti, MD Pharmacy:   [facility], [place] [address] Phone: [phone] Fax: [phone]  [facility], [place] [address] Phone: [phone] Fax: [phone]  [id number] [id number]Phone: [phone]: [phone] [place] [place]: [phone]: [phone]    Social Determinants of Health (SDOH) Interventions    Readmission Risk Interventions     No data to display

## 2022-06-28 NOTE — Hospital Course (Signed)
88 year old female with PMH of dementia, CVA, HTN, chronic anticoagulation presented to the hospital from ALF with unwitnessed fall.  Found to have right wrist comminuted and displaced fracture of the distal radial metadiaphysis.  Also oblique fracture of distal third diaphysis.  Orthopedic consulted. Patient has acute delirium most likely secondary to pain and pain medication.  Prognosis guarded.

## 2022-06-28 NOTE — Progress Notes (Addendum)
Noted patient had not voided this shift, bladder scan done noted 348 in bladder. Encouraged patient to void. MD Posey Pronto made aware. New order in and out patient. In and out complete had 400 ml urine output. MD Posey Pronto made aware.

## 2022-06-29 ENCOUNTER — Other Ambulatory Visit: Payer: Self-pay

## 2022-06-29 ENCOUNTER — Inpatient Hospital Stay (HOSPITAL_COMMUNITY): Payer: Medicare HMO | Admitting: Certified Registered Nurse Anesthetist

## 2022-06-29 ENCOUNTER — Encounter (HOSPITAL_COMMUNITY)
Admission: EM | Disposition: A | Discharge status: Nursing Home (Includes ICF) | Payer: Self-pay | Source: Skilled Nursing Facility | Attending: Internal Medicine

## 2022-06-29 ENCOUNTER — Inpatient Hospital Stay (HOSPITAL_COMMUNITY): Payer: Medicare HMO

## 2022-06-29 ENCOUNTER — Encounter (HOSPITAL_COMMUNITY): Payer: Self-pay | Admitting: Internal Medicine

## 2022-06-29 DIAGNOSIS — S62101B Fracture of unspecified carpal bone, right wrist, initial encounter for open fracture: Secondary | ICD-10-CM | POA: Diagnosis not present

## 2022-06-29 DIAGNOSIS — S52501B Unspecified fracture of the lower end of right radius, initial encounter for open fracture type I or II: Secondary | ICD-10-CM | POA: Diagnosis not present

## 2022-06-29 DIAGNOSIS — W19XXXA Unspecified fall, initial encounter: Secondary | ICD-10-CM | POA: Diagnosis not present

## 2022-06-29 DIAGNOSIS — S52601B Unspecified fracture of lower end of right ulna, initial encounter for open fracture type I or II: Secondary | ICD-10-CM

## 2022-06-29 HISTORY — PX: ORIF WRIST FRACTURE: SHX2133

## 2022-06-29 LAB — CBC WITH DIFFERENTIAL/PLATELET
Abs Immature Granulocytes: 0.03 10*3/uL (ref 0.00–0.07)
Basophils Absolute: 0.1 10*3/uL (ref 0.0–0.1)
Basophils Relative: 1 %
Eosinophils Absolute: 0.2 10*3/uL (ref 0.0–0.5)
Eosinophils Relative: 3 %
HCT: 42.2 % (ref 36.0–46.0)
Hemoglobin: 13.6 g/dL (ref 12.0–15.0)
Immature Granulocytes: 0 %
Lymphocytes Relative: 18 %
Lymphs Abs: 1.3 10*3/uL (ref 0.7–4.0)
MCH: 29.7 pg (ref 26.0–34.0)
MCHC: 32.2 g/dL (ref 30.0–36.0)
MCV: 92.1 fL (ref 80.0–100.0)
Monocytes Absolute: 0.6 10*3/uL (ref 0.1–1.0)
Monocytes Relative: 8 %
Neutro Abs: 5.2 10*3/uL (ref 1.7–7.7)
Neutrophils Relative %: 70 %
Platelets: 173 10*3/uL (ref 150–400)
RBC: 4.58 MIL/uL (ref 3.87–5.11)
RDW: 14 % (ref 11.5–15.5)
WBC: 7.5 10*3/uL (ref 4.0–10.5)
nRBC: 0 % (ref 0.0–0.2)

## 2022-06-29 LAB — BASIC METABOLIC PANEL
Anion gap: 8 (ref 5–15)
BUN: 10 mg/dL (ref 8–23)
CO2: 29 mmol/L (ref 22–32)
Calcium: 9.6 mg/dL (ref 8.9–10.3)
Chloride: 102 mmol/L (ref 98–111)
Creatinine, Ser: 0.8 mg/dL (ref 0.44–1.00)
GFR, Estimated: >60 mL/min (ref >60)
Glucose, Bld: 128 mg/dL — ABNORMAL HIGH (ref 70–99)
Potassium: 3.3 mmol/L — ABNORMAL LOW (ref 3.5–5.1)
Sodium: 139 mmol/L (ref 135–145)

## 2022-06-29 LAB — SURGICAL PCR SCREEN
MRSA, PCR: NEGATIVE
Staphylococcus aureus: NEGATIVE

## 2022-06-29 LAB — MAGNESIUM: Magnesium: 1.8 mg/dL (ref 1.7–2.4)

## 2022-06-29 SURGERY — OPEN REDUCTION INTERNAL FIXATION (ORIF) WRIST FRACTURE
Anesthesia: General | Site: Wrist | Laterality: Right

## 2022-06-29 MED ORDER — PROPOFOL 10 MG/ML IV BOLUS
INTRAVENOUS | Status: AC
  Filled 2022-06-29: qty 20

## 2022-06-29 MED ORDER — METOPROLOL TARTRATE 5 MG/5ML IV SOLN
INTRAVENOUS | Status: DC | PRN
  Administered 2022-06-29: 2 mg via INTRAVENOUS
  Administered 2022-06-29: 1 mg via INTRAVENOUS
  Administered 2022-06-29: 2 mg via INTRAVENOUS

## 2022-06-29 MED ORDER — PROPOFOL 10 MG/ML IV BOLUS
INTRAVENOUS | Status: DC | PRN
  Administered 2022-06-29: 80 mg via INTRAVENOUS

## 2022-06-29 MED ORDER — SODIUM CHLORIDE 0.9 % IR SOLN
Status: DC | PRN
  Administered 2022-06-29: 1000 mL

## 2022-06-29 MED ORDER — OXYCODONE HCL 5 MG/5ML PO SOLN: 5.0000 mg | Freq: Once | ORAL | Status: DC | PRN

## 2022-06-29 MED ORDER — FENTANYL CITRATE (PF) 100 MCG/2ML IJ SOLN
INTRAMUSCULAR | Status: AC
  Filled 2022-06-29: qty 2

## 2022-06-29 MED ORDER — DEXAMETHASONE SODIUM PHOSPHATE 10 MG/ML IJ SOLN
INTRAMUSCULAR | Status: AC
  Filled 2022-06-29: qty 1

## 2022-06-29 MED ORDER — POTASSIUM CHLORIDE 10 MEQ/100ML IV SOLN
10.0000 meq | INTRAVENOUS | Status: AC
  Administered 2022-06-29 (×2): 10 meq via INTRAVENOUS
  Filled 2022-06-29 (×2): qty 100

## 2022-06-29 MED ORDER — CHLORHEXIDINE GLUCONATE 0.12 % MT SOLN
15.0000 mL | Freq: Once | OROMUCOSAL | Status: AC
  Administered 2022-06-29: 15 mL via OROMUCOSAL

## 2022-06-29 MED ORDER — FENTANYL CITRATE PF 50 MCG/ML IJ SOSY
25.0000 ug | PREFILLED_SYRINGE | INTRAMUSCULAR | Status: DC | PRN
  Administered 2022-06-29: 50 ug via INTRAVENOUS
  Filled 2022-06-29: qty 1

## 2022-06-29 MED ORDER — ORAL CARE MOUTH RINSE: 15.0000 mL | Freq: Once | OROMUCOSAL | Status: AC

## 2022-06-29 MED ORDER — FENTANYL CITRATE (PF) 100 MCG/2ML IJ SOLN
INTRAMUSCULAR | Status: DC | PRN
  Administered 2022-06-29 (×2): 50 ug via INTRAVENOUS

## 2022-06-29 MED ORDER — LIDOCAINE HCL (CARDIAC) PF 100 MG/5ML IV SOSY
PREFILLED_SYRINGE | INTRAVENOUS | Status: DC | PRN
  Administered 2022-06-29: 100 mg via INTRAVENOUS

## 2022-06-29 MED ORDER — PROPOFOL 500 MG/50ML IV EMUL
INTRAVENOUS | Status: AC
  Filled 2022-06-29: qty 100

## 2022-06-29 MED ORDER — ONDANSETRON HCL 4 MG/2ML IJ SOLN
INTRAMUSCULAR | Status: DC | PRN
  Administered 2022-06-29: 4 mg via INTRAVENOUS

## 2022-06-29 MED ORDER — LACTATED RINGERS IV SOLN: INTRAVENOUS | Status: DC

## 2022-06-29 MED ORDER — METOPROLOL TARTRATE 5 MG/5ML IV SOLN
INTRAVENOUS | Status: AC
  Filled 2022-06-29: qty 5

## 2022-06-29 MED ORDER — MIDAZOLAM HCL 2 MG/2ML IJ SOLN
INTRAMUSCULAR | Status: AC
  Filled 2022-06-29: qty 2

## 2022-06-29 MED ORDER — PHENYLEPHRINE HCL (PRESSORS) 10 MG/ML IV SOLN
INTRAVENOUS | Status: DC | PRN
  Administered 2022-06-29 (×3): 160 ug via INTRAVENOUS

## 2022-06-29 MED ORDER — CHLORHEXIDINE GLUCONATE 0.12 % MT SOLN
OROMUCOSAL | Status: AC
  Filled 2022-06-29: qty 15

## 2022-06-29 MED ORDER — SUCCINYLCHOLINE CHLORIDE 200 MG/10ML IV SOSY
PREFILLED_SYRINGE | INTRAVENOUS | Status: DC | PRN
  Administered 2022-06-29 (×2): 20 mg via INTRAVENOUS

## 2022-06-29 MED ORDER — ONDANSETRON HCL 4 MG/2ML IJ SOLN: 4.0000 mg | Freq: Once | INTRAMUSCULAR | Status: DC | PRN

## 2022-06-29 MED ORDER — OXYCODONE HCL 5 MG PO TABS: 5.0000 mg | ORAL_TABLET | Freq: Once | ORAL | Status: DC | PRN

## 2022-06-29 MED ORDER — LIDOCAINE HCL (PF) 2 % IJ SOLN
INTRAMUSCULAR | Status: AC
  Filled 2022-06-29: qty 10

## 2022-06-29 MED ORDER — ONDANSETRON HCL 4 MG/2ML IJ SOLN
INTRAMUSCULAR | Status: AC
  Filled 2022-06-29: qty 2

## 2022-06-29 MED ORDER — CEFAZOLIN SODIUM-DEXTROSE 2-4 GM/100ML-% IV SOLN
INTRAVENOUS | Status: AC
  Filled 2022-06-29: qty 100

## 2022-06-29 MED ORDER — EPHEDRINE 5 MG/ML INJ
INTRAVENOUS | Status: AC
  Filled 2022-06-29: qty 10

## 2022-06-29 MED ORDER — SUCCINYLCHOLINE CHLORIDE 200 MG/10ML IV SOSY
PREFILLED_SYRINGE | INTRAVENOUS | Status: AC
  Filled 2022-06-29: qty 10

## 2022-06-29 MED ORDER — DEXAMETHASONE SODIUM PHOSPHATE 10 MG/ML IJ SOLN
INTRAMUSCULAR | Status: DC | PRN
  Administered 2022-06-29: 4 mg via INTRAVENOUS

## 2022-06-29 MED ORDER — EPHEDRINE SULFATE (PRESSORS) 50 MG/ML IJ SOLN
INTRAMUSCULAR | Status: DC | PRN
  Administered 2022-06-29: 10 mg via INTRAVENOUS

## 2022-06-29 SURGICAL SUPPLY — 73 items
.62 KWire IMPLANT
1.35 KWire IMPLANT
APL SKNCLS STERI-STRIP NONHPOA (GAUZE/BANDAGES/DRESSINGS) ×1
BANDAGE ESMARK 4X12 BL STRL LF (DISPOSABLE) ×2 IMPLANT
BENZOIN TINCTURE PRP APPL 2/3 (GAUZE/BANDAGES/DRESSINGS) IMPLANT
BIT DRILL 2.0 (BIT) ×1
BIT DRILL ORTH SHRT FLUT 2 (BIT) IMPLANT
BIT DRL ORTH SHRT FLUT DRLBT 2 (BIT) ×1
BLADE SURG SZ10 CARB STEEL (BLADE) IMPLANT
BNDG CMPR 12X4 ELC STRL LF (DISPOSABLE) ×1
BNDG ELASTIC 3X5.8 VLCR STR LF (GAUZE/BANDAGES/DRESSINGS) IMPLANT
BNDG ELASTIC 4X5.8 VLCR STR LF (GAUZE/BANDAGES/DRESSINGS) IMPLANT
BNDG ESMARK 4X12 BLUE STRL LF (DISPOSABLE) ×1
CLOTH BEACON ORANGE TIMEOUT ST (SAFETY) ×2 IMPLANT
COVER LIGHT HANDLE STERIS (MISCELLANEOUS) ×4 IMPLANT
COVER MAYO STAND XLG (MISCELLANEOUS) IMPLANT
CUFF TOURN SGL QUICK 18X4 (TOURNIQUET CUFF) ×2 IMPLANT
DRAPE C-ARM FOLDED MOBILE STRL (DRAPES) ×2 IMPLANT
DRAPE HALF SHEET 40X57 (DRAPES) IMPLANT
ELECT REM PT RETURN 9FT ADLT (ELECTROSURGICAL) ×1
ELECTRODE REM PT RTRN 9FT ADLT (ELECTROSURGICAL) ×2 IMPLANT
GAUZE SPONGE 4X4 12PLY STRL (GAUZE/BANDAGES/DRESSINGS) IMPLANT
GAUZE XEROFORM 5X9 LF (GAUZE/BANDAGES/DRESSINGS) IMPLANT
GLOVE BIO SURGEON STRL SZ7 (GLOVE) IMPLANT
GLOVE BIOGEL PI IND STRL 6.5 (GLOVE) IMPLANT
GLOVE BIOGEL PI IND STRL 7.0 (GLOVE) ×4 IMPLANT
GLOVE BIOGEL PI IND STRL 7.5 (GLOVE) IMPLANT
GLOVE SKINSENSE STRL SZ8.0 LF (GLOVE) IMPLANT
GLOVE SS N UNI LF 8.5 STRL (GLOVE) ×2 IMPLANT
GLOVE SURG POLYISO LF SZ8 (GLOVE) ×2 IMPLANT
GLOVE SURG SS PI 6.5 STRL IVOR (GLOVE) IMPLANT
GOWN STRL REUS W/TWL LRG LVL3 (GOWN DISPOSABLE) ×4 IMPLANT
GOWN STRL REUS W/TWL XL LVL3 (GOWN DISPOSABLE) ×2 IMPLANT
GUIDEWIRE 1.35MM (WIRE) IMPLANT
GUIDEWIRE 1.6 (WIRE) ×1
GUIDEWIRE ORTH 157X1.6XTROC (WIRE) IMPLANT
INST SET MINOR BONE (KITS) ×2 IMPLANT
KIT TURNOVER KIT A (KITS) ×2 IMPLANT
MANIFOLD NEPTUNE II (INSTRUMENTS) ×2 IMPLANT
NS IRRIG 1000ML POUR BTL (IV SOLUTION) ×2 IMPLANT
PACK BASIC LIMB (CUSTOM PROCEDURE TRAY) ×2 IMPLANT
PAD ABD 5X9 TENDERSORB (GAUZE/BANDAGES/DRESSINGS) IMPLANT
PAD ARMBOARD 7.5X6 YLW CONV (MISCELLANEOUS) ×2 IMPLANT
PAD CAST 4YDX4 CTTN HI CHSV (CAST SUPPLIES) IMPLANT
PADDING CAST COTTON 4X4 STRL (CAST SUPPLIES) ×2
PLATE 3/10H T-SHAPED (Plate) IMPLANT
PLATE T 10 HOLE (Plate) ×1 IMPLANT
PLATE TINE 6 HOLE (Plate) IMPLANT
SCREW CORT 2.7X13 (Screw) IMPLANT
SCREW CORT 2.7X14 (Screw) IMPLANT
SCREW CORT 2.7X16 (Screw) IMPLANT
SCREW CORT 2.7X18 (Screw) IMPLANT
SCREW CORT 2.7X22 (Screw) IMPLANT
SCREW CORT 2.7X24 (Screw) IMPLANT
SCREW CORT 2.7X32 (Screw) IMPLANT
SCREW LOCK 14X2.7XVAANKL (Screw) IMPLANT
SCREW LOCK 18X2.7XVAMN (Screw) IMPLANT
SCREW LOCK 20X2.7XVAMN (Screw) IMPLANT
SCREW LOCK 22X2.7XVA MN FRAG (Screw) IMPLANT
SCREW LOCKING 2.7X13 (Screw) IMPLANT
SCREW LOCKING 2.7X14 (Screw) ×1 IMPLANT
SCREW LOCKING 2.7X18 (Screw) ×2 IMPLANT
SCREW LOCKING 2.7X20 (Screw) ×1 IMPLANT
SCREW LOCKING 2.7X22 (Screw) ×1 IMPLANT
SET BASIN LINEN APH (SET/KITS/TRAYS/PACK) ×2 IMPLANT
SPONGE T-LAP 18X18 ~~LOC~~+RFID (SPONGE) ×4 IMPLANT
STAPLER VISISTAT 35W (STAPLE) IMPLANT
STRIP CLOSURE SKIN 1/2X4 (GAUZE/BANDAGES/DRESSINGS) IMPLANT
SUT MON AB 2-0 SH 27 (SUTURE) ×2
SUT MON AB 2-0 SH27 (SUTURE) ×2 IMPLANT
SYR BULB IRRIG 60ML STRL (SYRINGE) IMPLANT
SYR CONTROL 10ML LL (SYRINGE) ×2 IMPLANT
T PLATE 10 HOLE (Plate) ×1 IMPLANT

## 2022-06-29 NOTE — Anesthesia Preprocedure Evaluation (Signed)
Anesthesia Evaluation  Patient identified by MRN, date of birth, ID band Patient confused    Reviewed: Allergy & Precautions, H&P , NPO status , Patient's Chart, lab work & pertinent test results, reviewed documented beta blocker date and time , Unable to perform ROS - Chart review only  Airway Mallampati: II  TM Distance: >3 FB Neck ROM: full    Dental no notable dental hx.    Pulmonary neg pulmonary ROS, former smoker,    Pulmonary exam normal breath sounds clear to auscultation       Cardiovascular Exercise Tolerance: Good hypertension, negative cardio ROS   Rhythm:regular Rate:Normal     Neuro/Psych PSYCHIATRIC DISORDERS Dementia CVA    GI/Hepatic Neg liver ROS, GERD  Medicated,  Endo/Other  negative endocrine ROS  Renal/GU negative Renal ROS  negative genitourinary   Musculoskeletal   Abdominal   Peds  Hematology negative hematology ROS (+)   Anesthesia Other Findings   Reproductive/Obstetrics negative OB ROS                             Anesthesia Physical Anesthesia Plan  ASA: 2  Anesthesia Plan: General and General LMA   Post-op Pain Management:    Induction:   PONV Risk Score and Plan: Ondansetron  Airway Management Planned:   Additional Equipment:   Intra-op Plan:   Post-operative Plan:   Informed Consent: I have reviewed the patients History and Physical, chart, labs and discussed the procedure including the risks, benefits and alternatives for the proposed anesthesia with the patient or authorized representative who has indicated his/her understanding and acceptance.     Dental Advisory Given  Plan Discussed with: CRNA  Anesthesia Plan Comments:         Anesthesia Quick Evaluation

## 2022-06-29 NOTE — Interval H&P Note (Signed)
History and Physical Interval Note:  06/29/2022 12:35 PM  Christie Nelson  has presented today for surgery, with the diagnosis of Left wrist fracture.  The various methods of treatment have been discussed with the patient and family. After consideration of risks, benefits and other options for treatment, the patient has consented to  Procedure(s) with comments: OPEN REDUCTION INTERNAL FIXATION (ORIF) WRIST FRACTURE (Right) - Arthrex wrist plate system as a surgical intervention.  The patient's history has been reviewed, patient examined, no change in status, stable for surgery.  I have reviewed the patient's chart and labs.  Questions were answered to the patient's satisfaction.     Arther Abbott

## 2022-06-29 NOTE — Transfer of Care (Signed)
Immediate Anesthesia Transfer of Care Note  Patient: Christie Nelson  Procedure(s) Performed: OPEN REDUCTION INTERNAL FIXATION (ORIF) WRIST FRACTURE (Right: Wrist)  Patient Location: PACU  Anesthesia Type:General  Level of Consciousness: awake, drowsy and patient cooperative  Airway & Oxygen Therapy: Patient Spontanous Breathing and Patient connected to face mask oxygen  Post-op Assessment: Report given to RN, Post -op Vital signs reviewed and stable and Patient moving all extremities X 4  Post vital signs: Reviewed and stable  Last Vitals:  Vitals Value Taken Time  BP 147/75 06/29/22 1615  Temp    Pulse 77 06/29/22 1617  Resp 18 06/29/22 1617  SpO2 98 % 06/29/22 1617  Vitals shown include unvalidated device data.  Last Pain:  Vitals:   06/29/22 1215  TempSrc: Oral  PainSc: 2       Patients Stated Pain Goal: 6 (73/22/02 5427)  Complications: No notable events documented.

## 2022-06-29 NOTE — Interval H&P Note (Signed)
History and Physical Interval Note:  06/29/2022 12:26 PM  Christie Nelson  has presented today for surgery, with the diagnosis of Left wrist fracture.  The various methods of treatment have been discussed with the patient and family. After consideration of risks, benefits and other options for treatment, the patient has consented to  Procedure(s) with comments: OPEN REDUCTION INTERNAL FIXATION (ORIF) WRIST FRACTURE (Right) - Arthrex wrist plate system as a surgical intervention.  The patient's history has been reviewed, patient examined, no change in status, stable for surgery.  I have reviewed the patient's chart and labs.  Questions were answered to the patient's satisfaction.     Arther Abbott

## 2022-06-29 NOTE — Progress Notes (Signed)
Progress Note Patient: Christie Nelson QMG:[id number] DOB: 18-Jan-1930 DOA: 06/27/2022  DOS: the patient was seen and examined on 06/29/2022  Brief hospital course: 88 year old female with PMH of dementia, CVA, HTN, chronic anticoagulation presented to the hospital from ALF with unwitnessed fall.  Found to have right wrist comminuted and displaced fracture of the distal radial metadiaphysis.  Also oblique fracture of distal third diaphysis.  Orthopedic consulted. Patient has acute delirium most likely secondary to pain and pain medication.  Prognosis guarded. Assessment and Plan: Right wrist fracture. Open wound. Orthopedic consulted.  Underwent surgical correction with ORIF on 10/20. Postop plan long-arm cast for 4 weeks short arm cast for 4 weeks images at 2 weeks 4 weeks 8 weeks and then as needed Antibiotics per surgery.  Preop clearance. Patient with past medical history of dementia as well as A-fib on chronic anticoagulation. Presented with unwitnessed fall. Currently does not appear to have any prohibitive risk factor that requires further work-up although per Lyndel Safe score, patient has 1.9 % Risk of myocardial infarction or cardiac arrest, intraoperatively or up to 30 days post-op. She remains also at high risk for delirium postoperatively. Currently has some retention and poor p.o. intake and thus remains at high risk for AKI as well. This was discussed with son.  Currently verbalized understanding.  Dementia with delirium. Patient has severe dementia.  Currently on Seroquel and Paxil Does not appear to have any behavioral issues although unable to follow commands consistently. This is mostly delirium in the setting of pain and pain medication. This potentially can get worse after anesthesia as well as procedure. Is also given preclude her from eating and drinking adequately which will lead to poor recovery.  Goals of care conversation. Discussed with patient's son who has a  POA. Patient is not in a condition to provide any information. Patient may have a living will which may have DNR on it for her son. Per my discussion with patient and son patient's prognosis is poor. Son does not want her to be on life support or does not want to break her ribs with CPR. Currently patient will be transitioned to DNR based on this conversation and family is wishes.  Acute urinary retention. In and out catheterization x1. Patient actually afraid of soiling the bed therefore holding.  Currently working with pure wick.  Unwitnessed fall. Etiology not clear. Potentially mechanical fall. CT head and CT C-spine unremarkable. Right shoulder and elbow x-ray also negative for any acute abnormality. Negative facial x-rays negative for any acute fracture as well. PT OT consultation. Continue to monitor on telemetry.  HTN. Blood pressure elevated. Patient is on diuretics are currently on hold. Monitor.  Chronic A-fib. Currently rate controlled. Patient is not on any rate control medication.  Was on Eliquis. Currently Eliquis on hold.  Resumption pending surgical clearance.  Hypokalemia. Replaced.  Monitor.  Subjective: No new nausea no vomiting no fever no chills.  No other acute complaint other than pain.  Pain actually well controlled compared to yesterday.  Physical Exam: Vitals:   06/29/22 1615 06/29/22 1630 06/29/22 1645 06/29/22 1700  BP: (!) 147/75 (!) 152/96 (!) 161/82 (!) 166/86  Pulse: 76 83 78 82  Resp: '16 16 13 20  '$ Temp: 98.3 F (36.8 C)   (!) 97.5 F (36.4 C)  TempSrc:      SpO2: (!) 86% 92% 98% 92%  Weight:      Height:       General: Appear in mild distress; no visible  Abnormal Neck Mass Or lumps, Conjunctiva normal Cardiovascular: S1 and S2 Present, no Murmur, Respiratory: good respiratory effort, Bilateral Air entry present and CTA, no Crackles, no wheezes Abdomen: Bowel Sound present, Non tender Extremities: no Pedal edema Neurology: alert  and oriented to Self, Place and time.  Data Reviewed: I have Reviewed nursing notes, Vitals, and Lab results since pt's last encounter. Pertinent lab results CBC and BMP I have ordered test including CBC and BMP     Family Communication: Discussed with son at bedside  Disposition: Status is: Inpatient Remains inpatient appropriate because: Monitor for postop recovery. SCDs Start: 06/27/22 2257   Level of care: Med-Surg  Author: Berle Mull, MD 06/29/2022 7:14 PM Please look on [email or web address] to find out who is on call.

## 2022-06-29 NOTE — Op Note (Signed)
06/29/2022  4:21 PM  PATIENT:  Christie Nelson  88 y.o. female  PRE-OPERATIVE DIAGNOSIS: Right wrist fracture radius and ulna POST-OPERATIVE DIAGNOSIS: Open fracture right wrist fracture radius and ulna   PROCEDURE:  Procedure(s): OPEN REDUCTION INTERNAL FIXATION (ORIF) WRIST FRACTURE (Right)  Implants Arthrex T plate 2.7 with multiple screws in the shaft and metaphyseal region combination of locking and nonlocking on the distal radius fracture  Hook 2.7 plate on the ulna with interfrag screw combination of locking and nonlocking screws  Findings severely comminuted fracture distal radius and ulna Left wrist   SURGEON:  Surgeon(s) and Role:    Carole Civil, MD - Primary  PHYSICIAN ASSISTANT:   ASSISTANTS: Vevelyn Royals  ANESTHESIA:   general  EBL:  40 mL   BLOOD ADMINISTERED:none  DRAINS: none   LOCAL MEDICATIONS USED:  NONE  SPECIMEN:  No Specimen  DISPOSITION OF SPECIMEN:  N/A  COUNTS:  YES  TOURNIQUET:   Total Tourniquet Time Documented: Upper Arm (Right) - 119 minutes Total: Upper Arm (Right) - 119 minutes   DICTATION: .Viviann Spare Dictation  PLAN OF CARE: Admit to inpatient   PATIENT DISPOSITION:  PACU - hemodynamically stable.   Delay start of Pharmacological VTE agent (>24hrs) due to surgical blood loss or risk of bleeding: not applicable   Details of procedure  The patient was seen in preop and confirmed to have a right distal radius fracture  Site was marked  Chart review completed  Implants checked and reviewed.  Patient taken to surgery in the supine position she had an LMA general anesthetic  After sterile prep and drape she was noted to have an open 2 to 3 mm puncture wound classifying this as open fracture  The arm was prepped with Betadine draped sterilely Timeout was completed.  The limb was exsanguinated with a 4 inch Esmarch.  Tourniquet elevated to 225 mmHg.  A longitudinal incision was made over the volar aspect of the  distal radius.  We did have some difficulty as the patient's arm would not supinate into the flat or fully supinated position  Nonetheless we divided subcutaneous tissue found the flexor carpi radialis divided the tendon sheath and at the floor of the tendon sheath we swept away the flexor pollicis longus and then divided the pronator quadratus and elevated it ulnarly.  We protected the radial artery  Fracture was severely comminuted.  As we tried to reduce it several times we could not get enough traction on the wrist and had to convert to a fully general paralyzed anesthetic  As we were reducing the fracture became more comminuted we had to use K wires to pin the fracture together on the proximal aspect  After relaxation we able to get the radius reduced.  We initially tried a distal volar locking type plate but could not get a good reduction  We then went to a 2.7T plate and used a combination of pinning the plate distally and then using that as traction to gain length.  After placing 2 screws distally and 1 screw proximally imaging showed that length was restored there was severe comminution especially radially.  We finished the screws with a combination of locking and nonlocking screws both proximally and distally.  We took images and felt we had a good reduction  We then addressed the ulna  We made a longitudinal incision between the flexor and extensor tendons of the ulnar side of the wrist.  We found the fracture irrigated and reduced  and got a good anatomic reduction placed a hook plate.  We used nonlocking screw first and then locking screws and then traded out the nonlocking screws for the locking screws.  We did lose fixation and 2 of the holes.  We therefore used a i interfragmentary screw  Final reduction showed length was restored on the radius near anatomic reduction on the radius with the comminution noted near anatomic reduction on the ulna  We irrigated the wounds and closed with  2-0 Monocryl  We left the open fracture wound open after irrigation  The skin tear dorsally was closed with Steri-Strips  Staples were used to close the skin  Sugar-tong splint was applied  Postop plan long-arm cast for 4 weeks short arm cast for 4 weeks images at 2 weeks 4 weeks 8 weeks and then as needed

## 2022-06-29 NOTE — Anesthesia Procedure Notes (Signed)
Procedure Name: LMA Insertion Date/Time: 06/29/2022 1:20 PM  Performed by: Jonna Munro, CRNAPre-anesthesia Checklist: Patient identified, Emergency Drugs available, Suction available, Patient being monitored and Timeout performed Patient Re-evaluated:Patient Re-evaluated prior to induction Oxygen Delivery Method: Circle system utilized Preoxygenation: Pre-oxygenation with 100% oxygen Induction Type: IV induction LMA: LMA inserted LMA Size: 4.0 Number of attempts: 1 Placement Confirmation: positive ETCO2, breath sounds checked- equal and bilateral and CO2 detector Tube secured with: Tape Dental Injury: Teeth and Oropharynx as per pre-operative assessment

## 2022-06-29 NOTE — Care Management Important Message (Signed)
Important Message  Patient Details  Name: Christie Nelson MRN: [id number] Date of Birth: 07-05-1930   Medicare Important Message Given:  Yes (copy left on bedside table)     Tommy Medal 06/29/2022, 4:10 PM

## 2022-06-29 NOTE — Brief Op Note (Addendum)
06/29/2022  4:21 PM  PATIENT:  Christie Nelson  88 y.o. female  PRE-OPERATIVE DIAGNOSIS: Right wrist fracture radius and ulna POST-OPERATIVE DIAGNOSIS: Open fracture right wrist fracture radius and ulna   PROCEDURE:  Procedure(s): OPEN REDUCTION INTERNAL FIXATION (ORIF) WRIST FRACTURE (Right)  Implants Arthrex T plate 2.7 with multiple screws in the shaft and metaphyseal region combination of locking and nonlocking on the distal radius fracture  Hook 2.7 plate on the ulna with interfrag screw combination of locking and nonlocking screws  Findings severely comminuted fracture distal radius and ulna Left wrist   SURGEON:  Surgeon(s) and Role:    Carole Civil, MD - Primary  PHYSICIAN ASSISTANT:   ASSISTANTS: Vevelyn Royals  ANESTHESIA:   general  EBL:  40 mL   BLOOD ADMINISTERED:none  DRAINS: none   LOCAL MEDICATIONS USED:  NONE  SPECIMEN:  No Specimen  DISPOSITION OF SPECIMEN:  N/A  COUNTS:  YES  TOURNIQUET:   Total Tourniquet Time Documented: Upper Arm (Right) - 119 minutes Total: Upper Arm (Right) - 119 minutes   DICTATION: .Viviann Spare Dictation  PLAN OF CARE: Admit to inpatient   PATIENT DISPOSITION:  PACU - hemodynamically stable.   Delay start of Pharmacological VTE agent (>24hrs) due to surgical blood loss or risk of bleeding: not applicable   Details of procedure  The patient was seen in preop and confirmed to have a right distal radius fracture  Site was marked  Chart review completed  Implants checked and reviewed.  Patient taken to surgery in the supine position she had an LMA general anesthetic  After sterile prep and drape she was noted to have an open 2 to 3 mm puncture wound classifying this as open fracture  The arm was prepped with Betadine draped sterilely Timeout was completed.  The limb was exsanguinated with a 4 inch Esmarch.  Tourniquet elevated to 225 mmHg.  A longitudinal incision was made over the volar aspect of the  distal radius.  We did have some difficulty as the patient's arm would not supinate into the flat or fully supinated position  Nonetheless we divided subcutaneous tissue found the flexor carpi radialis divided the tendon sheath and at the floor of the tendon sheath we swept away the flexor pollicis longus and then divided the pronator quadratus and elevated it ulnarly.  We protected the radial artery  Fracture was severely comminuted.  As we tried to reduce it several times we could not get enough traction on the wrist and had to convert to a fully general paralyzed anesthetic  As we were reducing the fracture became more comminuted we had to use K wires to pin the fracture together on the proximal aspect  After relaxation we able to get the radius reduced.  We initially tried a distal volar locking type plate but could not get a good reduction  We then went to a 2.7T plate and used a combination of pinning the plate distally and then using that as traction to gain length.  After placing 2 screws distally and 1 screw proximally imaging showed that length was restored there was severe comminution especially radially.  We finished the screws with a combination of locking and nonlocking screws both proximally and distally.  We took images and felt we had a good reduction  We then addressed the ulna  We made a longitudinal incision between the flexor and extensor tendons of the ulnar side of the wrist.  We found the fracture irrigated and reduced  and got a good anatomic reduction placed a hook plate.  We used nonlocking screw first and then locking screws and then traded out the nonlocking screws for the locking screws.  We did lose fixation and 2 of the holes.  We therefore used a i interfragmentary screw  Final reduction showed length was restored on the radius near anatomic reduction on the radius with the comminution noted near anatomic reduction on the ulna  We irrigated the wounds and closed with  2-0 Monocryl  We left the open fracture wound open after irrigation  The skin tear dorsally was closed with Steri-Strips  Staples were used to close the skin  Sugar-tong splint was applied  Postop plan long-arm cast for 4 weeks short arm cast for 4 weeks images at 2 weeks 4 weeks 8 weeks and then as needed

## 2022-06-30 DIAGNOSIS — S62101B Fracture of unspecified carpal bone, right wrist, initial encounter for open fracture: Secondary | ICD-10-CM | POA: Diagnosis not present

## 2022-06-30 MED ORDER — METOPROLOL TARTRATE 25 MG PO TABS
25.0000 mg | ORAL_TABLET | Freq: Two times a day (BID) | ORAL | Status: DC
  Administered 2022-06-30 – 2022-07-01 (×3): 25 mg via ORAL
  Filled 2022-06-30 (×3): qty 1

## 2022-06-30 MED ORDER — POTASSIUM CHLORIDE 20 MEQ PO PACK
60.0000 meq | PACK | Freq: Once | ORAL | Status: DC
  Filled 2022-06-30: qty 3

## 2022-06-30 MED ORDER — POTASSIUM CHLORIDE CRYS ER 20 MEQ PO TBCR
40.0000 meq | EXTENDED_RELEASE_TABLET | ORAL | Status: AC
  Administered 2022-06-30 (×2): 40 meq via ORAL
  Filled 2022-06-30 (×2): qty 2

## 2022-06-30 NOTE — Progress Notes (Signed)
Tele called patients heart rate in the 140s, then returned to the 90s. Noted patient lying in bed asleep. Tele reported patient heart rate goes up to the 140s then returns to normal often. MD Posey Pronto made aware. New orders placed.

## 2022-06-30 NOTE — Progress Notes (Signed)
Progress Note Patient: Christie Nelson VZC:[id number] DOB: [birth year] DOA: 06/27/2022  DOS: the patient was seen and examined on 06/30/2022  Brief hospital course: 88 year old female with PMH of dementia, CVA, HTN, chronic anticoagulation presented to the hospital from ALF with unwitnessed fall.  Found to have right wrist comminuted and displaced fracture of the distal radial metadiaphysis.  Also oblique fracture of distal third diaphysis.  Orthopedic consulted. Patient has acute delirium most likely secondary to pain and pain medication.  Prognosis guarded. Assessment and Plan: Right wrist fracture. Open wound. Orthopedic consulted.  Underwent surgical correction with ORIF on 10/20. Postop plan long-arm cast for 4 weeks short arm cast for 4 weeks images at 2 weeks 4 weeks 8 weeks and then as needed Antibiotics per surgery.  Preop clearance. Patient with past medical history of dementia as well as A-fib on chronic anticoagulation. Presented with unwitnessed fall. Currently does not appear to have any prohibitive risk factor that requires further work-up although per Lyndel Safe score, patient has 1.9 % Risk of myocardial infarction or cardiac arrest, intraoperatively or up to 30 days post-op. She remains also at high risk for delirium postoperatively. This was discussed with son.  Currently verbalized understanding.  Dementia with delirium. Patient has severe dementia.  Currently on Seroquel and Paxil Does not appear to have any behavioral issues although unable to follow commands consistently. Patient is also hard of hearing which makes the examination difficult as patient has difficulty understanding due to her hearing.  Goals of care conversation. Discussed with patient's son who has a POA. Patient is not in a condition to provide any information. Patient may have a living will which may have DNR on it for her son. Per my discussion with patient and son patient's prognosis is poor. Son  does not want her to be on life support or does not want to break her ribs with CPR. Currently patient will be transitioned to DNR based on this conversation and family is wishes.  Acute urinary retention. In and out catheterization x1. Patient actually afraid of soiling the bed therefore holding.  Currently working with pure wick.  Unwitnessed fall. Etiology not clear. Potentially mechanical fall. CT head and CT C-spine unremarkable. Right shoulder and elbow x-ray also negative for any acute abnormality. Negative facial x-rays negative for any acute fracture as well. PT OT consultation. Continue to monitor on telemetry.  HTN. Blood pressure elevated. Patient is on diuretics are currently on hold. Monitor.  Chronic A-fib.  RVR Has a limited RVR.  We will initiate Lopressor with holding parameters. Currently Eliquis on hold.  Resumption pending surgical clearance.  Hypokalemia. Replaced.  Monitor.  Subjective: Difficulty swallowing pills this morning.  No nausea no vomiting.  Later on the patient was able to swallow pills adequately.  Oral intake is minimal per family.  Physical Exam: Vitals:   06/30/22 0415 06/30/22 0500 06/30/22 0928 06/30/22 1344  BP: 137/68  (!) 143/85 136/67  Pulse: 72  85 72  Resp: 13   15  Temp: 99.3 F (37.4 C)   98.6 F (37 C)  TempSrc:    Oral  SpO2: 92%   93%  Weight:  81.6 kg    Height:       General: Appear in mild distress; no visible Abnormal Neck Mass Or lumps, Conjunctiva normal Cardiovascular: S1 and S2 Present, no Murmur, Respiratory: good respiratory effort, Bilateral Air entry present and CTA, no Crackles, no wheezes Abdomen: Bowel Sound present, Non tender Extremities: no Pedal edema Neurology:  alert and oriented to Self  Data Reviewed: I have Reviewed nursing notes, Vitals, and Lab results since pt's last encounter. Pertinent lab results CBC and BMP I have ordered test including BC and BMP      Family Communication:  Discussed with son at bedside  Disposition: Status is: Inpatient Remains inpatient appropriate because: Monitor for postop recovery. SCDs Start: 06/27/22 2257   Level of care: Telemetry  Author: Berle Mull, MD 06/30/2022 7:23 PM Please look on [email or web address] to find out who is on call.

## 2022-06-30 NOTE — Progress Notes (Signed)
Patient ID: Christie Nelson, female   DOB: 15-Jan-1930, 88 y.o.   MRN: [id number]  POD 1 ORIF RIGHT WRIST   BP (!) 143/85   Pulse 85   Temp 99.3 F (37.4 C)   Resp 13   Ht '5\' 6"'$  (1.676 m)   Wt 81.6 kg   SpO2 92%   BMI 29.04 kg/m   AWAKE BUT SOMNOLENT I THINK SHE S HARD OF HEARING   She doesn't like the taste of PO pain meds so IV ok for today   Ice and elevation right forearm

## 2022-06-30 NOTE — Progress Notes (Signed)
Attempted to give patient morning medications, noted patient holding fluids in mouth. Patient alert but would not swallow. Patient spit fluids out in cup. MD Posey Pronto made aware. New orders placed. Attempted again at 1029, crushed PO medications and placed medication in ice cream. Patient tolerated swallowing with no issues. MD Posey Pronto made aware.

## 2022-07-01 ENCOUNTER — Inpatient Hospital Stay (HOSPITAL_COMMUNITY): Payer: Medicare HMO

## 2022-07-01 DIAGNOSIS — S62101B Fracture of unspecified carpal bone, right wrist, initial encounter for open fracture: Secondary | ICD-10-CM | POA: Diagnosis not present

## 2022-07-01 LAB — BASIC METABOLIC PANEL
Anion gap: 6 (ref 5–15)
BUN: 22 mg/dL (ref 8–23)
CO2: 28 mmol/L (ref 22–32)
Calcium: 9.4 mg/dL (ref 8.9–10.3)
Chloride: 103 mmol/L (ref 98–111)
Creatinine, Ser: 0.89 mg/dL (ref 0.44–1.00)
GFR, Estimated: >60 mL/min (ref >60)
Glucose, Bld: 131 mg/dL — ABNORMAL HIGH (ref 70–99)
Potassium: 4.5 mmol/L (ref 3.5–5.1)
Sodium: 137 mmol/L (ref 135–145)

## 2022-07-01 LAB — BLOOD GAS, ARTERIAL
Acid-Base Excess: 8 mmol/L — ABNORMAL HIGH (ref 0.0–2.0)
Bicarbonate: 32.8 mmol/L — ABNORMAL HIGH (ref 20.0–28.0)
Drawn by: 22179
FIO2: 28 %
O2 Saturation: 100 %
Patient temperature: 36.4
pCO2 arterial: 44 mmHg (ref 32–48)
pH, Arterial: 7.48 — ABNORMAL HIGH (ref 7.35–7.45)
pO2, Arterial: 93 mmHg (ref 83–108)

## 2022-07-01 LAB — CBC
HCT: 33.2 % — ABNORMAL LOW (ref 36.0–46.0)
Hemoglobin: 10.5 g/dL — ABNORMAL LOW (ref 12.0–15.0)
MCH: 29.9 pg (ref 26.0–34.0)
MCHC: 31.6 g/dL (ref 30.0–36.0)
MCV: 94.6 fL (ref 80.0–100.0)
Platelets: 156 10*3/uL (ref 150–400)
RBC: 3.51 MIL/uL — ABNORMAL LOW (ref 3.87–5.11)
RDW: 14.3 % (ref 11.5–15.5)
WBC: 8.8 10*3/uL (ref 4.0–10.5)
nRBC: 0 % (ref 0.0–0.2)

## 2022-07-01 LAB — GLUCOSE, CAPILLARY: Glucose-Capillary: 116 mg/dL — ABNORMAL HIGH (ref 70–99)

## 2022-07-01 LAB — MAGNESIUM: Magnesium: 2.3 mg/dL (ref 1.7–2.4)

## 2022-07-01 MED ORDER — LACTATED RINGERS IV SOLN: INTRAVENOUS | Status: DC

## 2022-07-01 MED ORDER — HYDROCODONE-ACETAMINOPHEN 5-325 MG PO TABS
1.0000 | ORAL_TABLET | Freq: Four times a day (QID) | ORAL | Status: DC | PRN
  Filled 2022-07-01: qty 1

## 2022-07-01 MED ORDER — ACETAMINOPHEN 500 MG PO TABS
1000.0000 mg | ORAL_TABLET | Freq: Three times a day (TID) | ORAL | Status: AC
  Administered 2022-07-01 – 2022-07-02 (×3): 1000 mg via ORAL
  Filled 2022-07-01 (×3): qty 2

## 2022-07-01 MED ORDER — TRAMADOL HCL 50 MG PO TABS: 50.0000 mg | ORAL_TABLET | Freq: Four times a day (QID) | ORAL | Status: DC | PRN

## 2022-07-01 NOTE — Progress Notes (Signed)
patient is still sleeping. She will wake up for a second if you say her name, but will go right back to sleep. The family is concerned. I explained to them that she had seroquel last night that could be causing it and I saw that it is now D/Ced.

## 2022-07-01 NOTE — Progress Notes (Addendum)
telemetry called and stated patient's hr dropped down to 30 just for a second and went back up to the 50s. Vitals are 140/75, rr 15, 99 o2 on 2l, hr 61. Patient is able to be aroused when saying her name and then will go back to sleep. MD Posey Pronto notified.

## 2022-07-01 NOTE — Evaluation (Signed)
Clinical/Bedside Swallow Evaluation Patient Details  Name: Christie Nelson MRN: [id number] Date of Birth: 08/24/1930  Today's Date: 07/01/2022 Time: SLP Start Time (ACUTE ONLY): 1101 SLP Stop Time (ACUTE ONLY): 1125 SLP Time Calculation (min) (ACUTE ONLY): 24 min  Past Medical History:  Past Medical History:  Diagnosis Date   Arthritis    GERD (gastroesophageal reflux disease)    Hypertension    Prolactinoma ([place])    Stroke [facility])    Past Surgical History:  Past Surgical History:  Procedure Laterality Date   ABDOMINAL HYSTERECTOMY  yrs ago   CATARACT EXTRACTION W/ INTRAOCULAR LENS  IMPLANT, BILATERAL  2008   both eyes done   RECTOCELE REPAIR  yrs ago   right hip arthroplasty  2003   TONSILLECTOMY  age 64   TOTAL HIP ARTHROPLASTY  09/18/2011   Procedure: TOTAL HIP ARTHROPLASTY ANTERIOR APPROACH;  Surgeon: Mauri Pole;  Location: [facility];  Service: Orthopedics;  Laterality: Left;   HPI:  88 year old female with PMH of dementia, CVA, HTN, chronic anticoagulation presented to the hospital from ALF with unwitnessed fall.  Found to have right wrist comminuted and displaced fracture of the distal radial metadiaphysis.  Also oblique fracture of distal third diaphysis.  Orthopedic consulted.  Patient has acute delirium most likely secondary to pain and pain medication.  Prognosis guarded. Orthopedic consulted.  Underwent surgical correction with ORIF on 10/20.  Postop plan long-arm cast for 4 weeks short arm cast for 4 weeks images at 2 weeks 4 weeks 8 weeks and then as needed. BSE ordered due to intermittent difficulty swallowing pills.    Assessment / Plan / Recommendation  Clinical Impression  Clinical swallow evaluation completed, however limited due to waxing and waning alertness. Pt reportedly alert all day yesterday and consumed foods and liquids without incident, however was given Seroquel over night and is now not alert. RN was able to give PO medications crushed in puree this AM  after rousing Pt briefly. Pt opens eyes and responds to questions (says hungry/thirsty) and then quickly asleep again. Pt manipulated ice chip and eventually swallowed. She did not attempt to close lips around spoon or suck from the straw due to poor alertness. Pt's son asked if she could just be spoon fed sips of water and SLP provided education regarding risks for aspiration if feeding when not alert. Pt has a diet ordered and it is ok to feed her if Pt is alert and upright, however withold PO if she is in this current state of reduced alertness. Recommendations reviewed with family and nursing staff. SLP will check back tomorrow. SLP Visit Diagnosis: Dysphagia, unspecified (R13.10)    Aspiration Risk  Mild aspiration risk;Risk for inadequate nutrition/hydration    Diet Recommendation Dysphagia 1 (Puree);Thin liquid   Liquid Administration via: Straw;Cup Medication Administration: Crushed with puree Supervision: Staff to assist with self feeding;Full supervision/cueing for compensatory strategies Compensations: Slow rate;Small sips/bites Postural Changes: Seated upright at 90 degrees;Remain upright for at least 30 minutes after po intake    Other  Recommendations Oral Care Recommendations: Oral care BID;Staff/trained caregiver to provide oral care Other Recommendations: Clarify dietary restrictions    Recommendations for follow up therapy are one component of a multi-disciplinary discharge planning process, led by the attending physician.  Recommendations may be updated based on patient status, additional functional criteria and insurance authorization.  Follow up Recommendations Skilled nursing-short term rehab (<3 hours/day)      Assistance Recommended at Discharge Frequent or constant Supervision/Assistance  Functional Status  Assessment Patient has had a recent decline in their functional status and demonstrates the ability to make significant improvements in function in a reasonable and  predictable amount of time.  Frequency and Duration min 2x/week  1 week       Prognosis Prognosis for Safe Diet Advancement: Fair Barriers to Reach Goals: Cognitive deficits      Swallow Study   General Date of Onset: 06/27/22 HPI: 88 year old female with PMH of dementia, CVA, HTN, chronic anticoagulation presented to the hospital from ALF with unwitnessed fall.  Found to have right wrist comminuted and displaced fracture of the distal radial metadiaphysis.  Also oblique fracture of distal third diaphysis.  Orthopedic consulted.  Patient has acute delirium most likely secondary to pain and pain medication.  Prognosis guarded. Orthopedic consulted.  Underwent surgical correction with ORIF on 10/20.  Postop plan long-arm cast for 4 weeks short arm cast for 4 weeks images at 2 weeks 4 weeks 8 weeks and then as needed. BSE ordered due to intermittent difficulty swallowing pills. Type of Study: Bedside Swallow Evaluation Diet Prior to this Study: Dysphagia 1 (puree);Thin liquids Temperature Spikes Noted: No Respiratory Status: Nasal cannula History of Recent Intubation: Yes Length of Intubations (days): 1 days Date extubated: 06/29/22 Behavior/Cognition: Lethargic/Drowsy Oral Cavity Assessment: Within Functional Limits Oral Care Completed by SLP: Yes Oral Cavity - Dentition: Edentulous Vision: Impaired for self-feeding Self-Feeding Abilities: Total assist Patient Positioning: Upright in bed Baseline Vocal Quality: Normal Volitional Cough: Cognitively unable to elicit Volitional Swallow: Able to elicit    Oral/Motor/Sensory Function Overall Oral Motor/Sensory Function: Within functional limits   Ice Chips Ice chips: Impaired Presentation: Spoon Oral Phase Impairments: Reduced lingual movement/coordination;Poor awareness of bolus Oral Phase Functional Implications: Oral holding   Thin Liquid Thin Liquid: Not tested Presentation:  (Pt did not suck from the straw)    Nectar Thick  Nectar Thick Liquid: Not tested   Honey Thick Honey Thick Liquid: Not tested   Puree Puree: Not tested   Solid     Solid: Not tested     Thank you,  Genene Churn, [place]  Peaches Vanoverbeke 07/01/2022,11:39 AM

## 2022-07-01 NOTE — Progress Notes (Signed)
Overnight, A & O x 1 patient tolerated right wrist fracture pain using ice packs and PRN pain meds. She slept most of the night. She took her meds crushed in applesauce. Her fractured wrist was elevated.

## 2022-07-01 NOTE — Progress Notes (Signed)
Progress Note Patient: Christie Nelson ALP:[id number] DOB: 1929/10/24 DOA: 06/27/2022  DOS: the patient was seen and examined on 07/01/2022  Brief hospital course: 88 year old female with PMH of dementia, CVA, HTN, chronic anticoagulation presented to the hospital from ALF with unwitnessed fall.  Found to have right wrist comminuted and displaced fracture of the distal radial metadiaphysis.  Also oblique fracture of distal third diaphysis.  Orthopedic consulted. Patient has acute delirium most likely secondary to pain and pain medication.  Prognosis guarded.  Assessment and Plan: Right wrist fracture. Open wound. Orthopedic consulted.  Underwent surgical correction with ORIF on 10/20. Postop plan long-arm cast for 4 weeks short arm cast for 4 weeks images at 2 weeks 4 weeks 8 weeks and then as needed Antibiotics per surgery.  Preop clearance. Patient with past medical history of dementia as well as A-fib on chronic anticoagulation. Presented with unwitnessed fall. Currently does not appear to have any prohibitive risk factor that requires further work-up although per Lyndel Safe score, patient has 1.9 % Risk of myocardial infarction or cardiac arrest, intraoperatively or up to 30 days post-op. She remains also at high risk for delirium postoperatively. This was discussed with son.  Currently verbalized understanding.  Dementia with delirium. Patient has severe dementia.  Currently on Seroquel and Paxil Does not appear to have any behavioral issues. Intermittently unable to follow commands although there is a component of hearing difficulty as well. Patient currently significantly drowsy. We will minimize narcotics and use scheduled Tylenol. Currently no focal deficit although drowsiness is prolonged then usual medication induced sleepiness.  We will check ABG and CT head.  Goals of care conversation. Discussed with patient's son who has a POA. Patient is not in a condition to provide any  information. Patient may have a living will which may have DNR on it for her son. Per my discussion with patient and son patient's prognosis is poor. Son does not want her to be on life support or does not want to break her ribs with CPR. Currently patient will be transitioned to DNR based on this conversation and family is wishes.  Acute urinary retention. In and out catheterization x1.  Unwitnessed fall. Etiology not clear. Potentially mechanical fall. CT head and CT C-spine unremarkable. Right shoulder and elbow x-ray also negative for any acute abnormality. Negative facial x-rays negative for any acute fracture as well. PT OT consultation. Continue to monitor on telemetry.  HTN. Blood pressure elevated. Patient is on diuretics are currently on hold. Monitor.  Chronic A-fib.  RVR Bradycardia on telemetry on 10/22 Due to RVR on telemetry patient was on on Lopressor with holding parameters, Seen with bradycardia on 10/22.  We will hold Lopressor. Eliquis currently on hold due to postop anemia.  Hypokalemia. Replaced.  Monitor.  Subjective: Significantly drowsy.  No nausea no vomiting or no fever no chills.  Able to follow commands when she is able to stay awake.  Physical Exam: Vitals:   06/30/22 2110 07/01/22 0548 07/01/22 1051 07/01/22 1243  BP: (!) 110/53 (!) 151/85 133/73 132/70  Pulse: 79 67 62 66  Resp: '18 14  15  '$ Temp: (!) 97.5 F (36.4 C) 98.4 F (36.9 C)  97.6 F (36.4 C)  TempSrc: Oral Oral  Axillary  SpO2: 95% 97%  97%  Weight:      Height:       General: Appear in mild distress; no visible Abnormal Neck Mass Or lumps, Conjunctiva normal Cardiovascular: S1 and S2 Present, no Murmur, Respiratory: good  respiratory effort, Bilateral Air entry present and CTA, no Crackles, no wheezes Abdomen: Bowel Sound present, Non tender Extremities: no Pedal edema Neurology: Drowsy and oriented to Self  Data Reviewed: I have Reviewed nursing notes, Vitals, and Lab  results since pt's last encounter. Pertinent lab results CBC and BMP I have ordered test including CBC and BMP, CT head and EKG I have ordered imaging studies CT head.      Family Communication: Discussed with son at bedside  Disposition: Status is: Inpatient Remains inpatient appropriate because: Monitor for postop recovery.  Improvement in mentation. SCDs Start: 06/27/22 2257   Level of care: Telemetry  Author: Berle Mull, MD 07/01/2022 6:43 PM Please look on [email or web address] to find out who is on call.

## 2022-07-01 NOTE — Anesthesia Postprocedure Evaluation (Signed)
Anesthesia Post Note  Patient: [name]  Procedure(s) Performed: OPEN REDUCTION INTERNAL FIXATION (ORIF) WRIST FRACTURE (Right: Wrist)  Patient location during evaluation: Phase II Anesthesia Type: General Level of consciousness: awake Pain management: pain level controlled Vital Signs Assessment: post-procedure vital signs reviewed and stable Respiratory status: spontaneous breathing and respiratory function stable Cardiovascular status: blood pressure returned to baseline and stable Postop Assessment: no headache and no apparent nausea or vomiting Anesthetic complications: no Comments: Late entry   No notable events documented.   Last Vitals:  Vitals:   06/30/22 2110 07/01/22 0548  BP: (!) 110/53 (!) 151/85  Pulse: 79 67  Resp: 18 14  Temp: (!) 36.4 C 36.9 C  SpO2: 95% 97%    Last Pain:  Vitals:   07/01/22 0548  TempSrc: Oral  PainSc:                  Louann Sjogren

## 2022-07-02 DIAGNOSIS — S62101B Fracture of unspecified carpal bone, right wrist, initial encounter for open fracture: Secondary | ICD-10-CM | POA: Diagnosis not present

## 2022-07-02 LAB — CBC
HCT: 32.6 % — ABNORMAL LOW (ref 36.0–46.0)
Hemoglobin: 10.1 g/dL — ABNORMAL LOW (ref 12.0–15.0)
MCH: 29.3 pg (ref 26.0–34.0)
MCHC: 31 g/dL (ref 30.0–36.0)
MCV: 94.5 fL (ref 80.0–100.0)
Platelets: 154 10*3/uL (ref 150–400)
RBC: 3.45 MIL/uL — ABNORMAL LOW (ref 3.87–5.11)
RDW: 14.3 % (ref 11.5–15.5)
WBC: 5.3 10*3/uL (ref 4.0–10.5)
nRBC: 0 % (ref 0.0–0.2)

## 2022-07-02 MED ORDER — ORAL CARE MOUTH RINSE: 15.0000 mL | OROMUCOSAL | Status: DC | PRN

## 2022-07-02 MED ORDER — ATORVASTATIN CALCIUM 10 MG PO TABS
10.0000 mg | ORAL_TABLET | Freq: Every evening | ORAL | Status: DC
  Administered 2022-07-02: 10 mg via ORAL
  Filled 2022-07-02: qty 1

## 2022-07-02 MED ORDER — ORAL CARE MOUTH RINSE
15.0000 mL | OROMUCOSAL | Status: DC
  Administered 2022-07-02 – 2022-07-03 (×3): 15 mL via OROMUCOSAL

## 2022-07-02 MED ORDER — TRAMADOL HCL 50 MG PO TABS: 50.0000 mg | ORAL_TABLET | Freq: Four times a day (QID) | ORAL | Status: DC | PRN

## 2022-07-02 MED ORDER — APIXABAN 5 MG PO TABS
5.0000 mg | ORAL_TABLET | Freq: Two times a day (BID) | ORAL | Status: DC
  Administered 2022-07-02 – 2022-07-03 (×3): 5 mg via ORAL
  Filled 2022-07-02 (×3): qty 1

## 2022-07-02 NOTE — Progress Notes (Signed)
Patient assisted up to chair this am with two staff assist. Tolerated sitting up in chair well, no complaints. Chair alarm remained on for safety. Tolerated meals well and meds in applesauce. No complaints of pain this afternoon, family requested night shift staff assess and give tramadol as ordered PRN pain tonight if needed. Denied pain on evening round, stated comfortable in bed with right arm elevated on pillow for comfort.

## 2022-07-02 NOTE — Plan of Care (Signed)
  Problem: Activity: Goal: Risk for activity intolerance will decrease Outcome: Progressing   Problem: Nutrition: Goal: Adequate nutrition will be maintained Outcome: Progressing   Problem: Safety: Goal: Ability to remain free from injury will improve Outcome: Progressing   Patient assisted up to chair with two assist, tolerated well. Chair alarm in place for safety, has call light and phone within reach. Patient demonstrates understanding of how to use call light. States comfortable, requested TV turned on. Tolerated breakfast well this morning with NT assist to feed.

## 2022-07-02 NOTE — Progress Notes (Signed)
Progress Note Patient: Christie Nelson LPF:[id number] DOB: [birth year] DOA: 06/27/2022  DOS: the patient was seen and examined on 07/02/2022  Brief hospital course: 88 year old female with PMH of dementia, CVA, HTN, chronic anticoagulation presented to the hospital from ALF with unwitnessed fall.  Found to have right wrist comminuted and displaced fracture of the distal radial metadiaphysis.  Also oblique fracture of distal third diaphysis.  Orthopedic consulted. Patient has acute delirium most likely secondary to pain and pain medication.  Prognosis guarded. Assessment and Plan: Right wrist fracture. Open wound. Orthopedic consulted.  Underwent surgical correction with ORIF on 10/20. Postop plan long-arm cast for 4 weeks short arm cast for 4 weeks images at 2 weeks 4 weeks 8 weeks and then as needed Antibiotics per surgery.   Preop clearance. Patient with past medical history of dementia as well as A-fib on chronic anticoagulation. Presented with unwitnessed fall. Currently does not appear to have any prohibitive risk factor that requires further work-up although per Lyndel Safe score, patient has 1.9 % Risk of myocardial infarction or cardiac arrest, intraoperatively or up to 30 days post-op. She remains also at high risk for delirium postoperatively. This was discussed with son.  Currently verbalized understanding.   Dementia with delirium. Patient has severe dementia.  Currently on Seroquel and Paxil Does not appear to have any behavioral issues. Intermittently unable to follow commands although there is a component of hearing difficulty as well. Patient was significantly drowsy and lethargic on 10/22 requiring a CT of the head which was unremarkable. We will minimize narcotics and use scheduled Tylenol.   Goals of care conversation. Discussed with patient's son who has a POA. Patient is not in a condition to provide any information. Patient may have a living will which may have DNR on  it for her son. Per my discussion with patient and son patient's prognosis is poor. Son does not want her to be on life support or does not want to break her ribs with CPR. Currently patient will be transitioned to DNR based on this conversation and family is wishes.   Acute urinary retention. In and out catheterization x1.   Unwitnessed fall. Etiology not clear. Potentially mechanical fall. CT head and CT C-spine unremarkable. Right shoulder and elbow x-ray also negative for any acute abnormality. Negative facial x-rays negative for any acute fracture as well. PT OT consultation. Continue to monitor on telemetry.   HTN. Blood pressure elevated. Patient is on diuretics are currently on hold. Monitor.   Chronic A-fib.  RVR Bradycardia on telemetry on 10/22 Due to RVR on telemetry patient was on on Lopressor with holding parameters, Seen with bradycardia on 10/22.  We will hold Lopressor. Eliquis currently on hold due to postop anemia.   Hypokalemia. Replaced.  Monitor.  Subjective: No nausea no vomiting.  More awake and alert.  No fever no chills.  Physical Exam: Vitals:   07/01/22 2038 07/02/22 0530 07/02/22 1325 07/02/22 1535  BP: (!) 119/49 135/61 129/71   Pulse: 78 68 67   Resp: '16 16 20   '$ Temp: 98.5 F (36.9 C) 97.8 F (36.6 C) (!) 97 F (36.1 C)   TempSrc:      SpO2: 95% 100% 100% 98%  Weight:      Height:       General: Appear in mild distress; no visible Abnormal Neck Mass Or lumps, Conjunctiva normal Cardiovascular: S1 and S2 Present, no Murmur, Respiratory: good respiratory effort, Bilateral Air entry present and CTA, no Crackles, no wheezes  Abdomen: Bowel Sound present, Non tender  Extremities: no Pedal edema Neurology: alert and oriented to self, no focal deficit Gait not checked due to patient safety concerns   Data Reviewed: I have Reviewed nursing notes, Vitals, and Lab results since pt's last encounter. Pertinent lab results CBC and CMP I have  ordered test including CBC and BMP    Family Communication: Family at bedside  Disposition: Status is: Inpatient Remains inpatient appropriate because: Monitor for stability in mentation   SCDs Start: 06/27/22 2257 apixaban (ELIQUIS) tablet 5 mg   Level of care: Telemetry Telemetry reviewed, shows Atrial fib with RVR  Author: Berle Mull, MD 07/02/2022 5:50 PM Please look on [email or web address] to find out who is on call.

## 2022-07-03 ENCOUNTER — Inpatient Hospital Stay (HOSPITAL_COMMUNITY): Payer: Medicare HMO

## 2022-07-03 DIAGNOSIS — Z743 Need for continuous supervision: Secondary | ICD-10-CM | POA: Diagnosis not present

## 2022-07-03 DIAGNOSIS — Z01818 Encounter for other preprocedural examination: Secondary | ICD-10-CM

## 2022-07-03 DIAGNOSIS — K5900 Constipation, unspecified: Secondary | ICD-10-CM | POA: Diagnosis not present

## 2022-07-03 DIAGNOSIS — D62 Acute posthemorrhagic anemia: Secondary | ICD-10-CM | POA: Diagnosis not present

## 2022-07-03 DIAGNOSIS — R69 Illness, unspecified: Secondary | ICD-10-CM | POA: Diagnosis not present

## 2022-07-03 DIAGNOSIS — R52 Pain, unspecified: Secondary | ICD-10-CM | POA: Diagnosis not present

## 2022-07-03 DIAGNOSIS — H9190 Unspecified hearing loss, unspecified ear: Secondary | ICD-10-CM

## 2022-07-03 DIAGNOSIS — E876 Hypokalemia: Secondary | ICD-10-CM | POA: Diagnosis present

## 2022-07-03 DIAGNOSIS — I959 Hypotension, unspecified: Secondary | ICD-10-CM | POA: Diagnosis not present

## 2022-07-03 DIAGNOSIS — S62101B Fracture of unspecified carpal bone, right wrist, initial encounter for open fracture: Secondary | ICD-10-CM | POA: Diagnosis not present

## 2022-07-03 DIAGNOSIS — Z7189 Other specified counseling: Secondary | ICD-10-CM

## 2022-07-03 DIAGNOSIS — R001 Bradycardia, unspecified: Secondary | ICD-10-CM | POA: Diagnosis present

## 2022-07-03 LAB — CBC
HCT: 34 % — ABNORMAL LOW (ref 36.0–46.0)
Hemoglobin: 10.8 g/dL — ABNORMAL LOW (ref 12.0–15.0)
MCH: 29.4 pg (ref 26.0–34.0)
MCHC: 31.8 g/dL (ref 30.0–36.0)
MCV: 92.6 fL (ref 80.0–100.0)
Platelets: 193 10*3/uL (ref 150–400)
RBC: 3.67 MIL/uL — ABNORMAL LOW (ref 3.87–5.11)
RDW: 13.8 % (ref 11.5–15.5)
WBC: 5.6 10*3/uL (ref 4.0–10.5)
nRBC: 0 % (ref 0.0–0.2)

## 2022-07-03 LAB — BASIC METABOLIC PANEL
Anion gap: 6 (ref 5–15)
BUN: 22 mg/dL (ref 8–23)
CO2: 26 mmol/L (ref 22–32)
Calcium: 9.3 mg/dL (ref 8.9–10.3)
Chloride: 108 mmol/L (ref 98–111)
Creatinine, Ser: 0.71 mg/dL (ref 0.44–1.00)
GFR, Estimated: >60 mL/min (ref >60)
Glucose, Bld: 112 mg/dL — ABNORMAL HIGH (ref 70–99)
Potassium: 3.6 mmol/L (ref 3.5–5.1)
Sodium: 140 mmol/L (ref 135–145)

## 2022-07-03 LAB — MAGNESIUM: Magnesium: 2.1 mg/dL (ref 1.7–2.4)

## 2022-07-03 MED ORDER — DOCUSATE SODIUM 100 MG PO CAPS: 100.0000 mg | ORAL_CAPSULE | Freq: Two times a day (BID) | ORAL | 2 refills | Status: AC

## 2022-07-03 MED ORDER — POLYETHYLENE GLYCOL 3350 17 G PO PACK: 17.0000 g | PACK | Freq: Every day | ORAL | 0 refills | Status: DC | PRN

## 2022-07-03 MED ORDER — POLYETHYLENE GLYCOL 3350 17 G PO PACK: 17.0000 g | PACK | Freq: Every day | ORAL | 0 refills | Status: DC

## 2022-07-03 MED ORDER — HYDROCODONE-ACETAMINOPHEN 5-325 MG PO TABS: 1.0000 | ORAL_TABLET | Freq: Two times a day (BID) | ORAL | 0 refills | Status: AC | PRN

## 2022-07-03 NOTE — Care Management Important Message (Signed)
Important Message  Patient Details  Name: Christie Nelson MRN: [id number] Date of Birth: 11-23-1929   Medicare Important Message Given:  Yes     Tommy Medal 07/03/2022, 12:52 PM

## 2022-07-03 NOTE — [place] FL2 (Signed)
[place] MEDICAID FL2 LEVEL OF CARE SCREENING TOOL     IDENTIFICATION  Patient Name: Christie Nelson Birthdate: 1929-12-08 Sex: female Admission Date (Current Location): 06/27/2022  [facility] and [place] Number:  [employer] and Address:  [place] [address] [place]      Provider Number: [id number]  Attending Physician Name and Address:  Lavina Hamman, MD  Relative Name and Phone Number:       Current Level of Care: Hospital Recommended Level of Care:   Prior Approval Number:    Date Approved/Denied:   PASRR Number:    Discharge Plan:      Current Diagnoses: Patient Active Problem List   Diagnosis Date Noted   Pre-op evaluation 07/03/2022   Goals of care, counseling/discussion 07/03/2022   Sinus bradycardia 07/03/2022   Hypokalemia 07/03/2022   Hard of hearing 07/03/2022   Acute postoperative anemia due to expected blood loss 07/03/2022   Right wrist fracture, open, initial encounter 06/27/2022   Paroxysmal atrial fibrillation with RVR ([place]) 06/27/2022   Fall 06/27/2022   Delirium superimposed on dementia 06/27/2022   HTN (hypertension) 06/27/2022   S/P left hip replacement 09/18/2011    Orientation RESPIRATION BLADDER Height & Weight     Self  Normal Incontinent Weight: 179 lb 14.3 oz (81.6 kg) Height:  '5\' 6"'$  (167.6 cm)  BEHAVIORAL SYMPTOMS/MOOD NEUROLOGICAL BOWEL NUTRITION STATUS      Continent Diet (Regular)  AMBULATORY STATUS COMMUNICATION OF NEEDS Skin   Extensive Assist Verbally Surgical wounds                       Personal Care Assistance Level of Assistance  Bathing, Feeding, Dressing Bathing Assistance: Maximum assistance Feeding assistance: Limited assistance Dressing Assistance: Maximum assistance     Functional Limitations Info  Sight, Hearing, Speech Sight Info: Adequate Hearing Info: Adequate Speech Info: Adequate    SPECIAL CARE FACTORS FREQUENCY  PT (By licensed PT)     PT  Frequency: Home health PT              Contractures Contractures Info: Not present    Additional Factors Info  Code Status, Allergies Code Status Info: Full Allergies Info: NKA           Current Medications (07/03/2022):  This is the current hospital active medication list Current Facility-Administered Medications  Medication Dose Route Frequency Provider Last Rate Last Admin   acetaminophen (TYLENOL) tablet 650 mg  650 mg Oral Q6H PRN Emokpae, Ejiroghene E, MD       Or   acetaminophen (TYLENOL) suppository 650 mg  650 mg Rectal Q6H PRN Emokpae, Ejiroghene E, MD       apixaban (ELIQUIS) tablet 5 mg  5 mg Oral BID Lavina Hamman, MD   5 mg at 07/03/22 0858   atorvastatin (LIPITOR) tablet 10 mg  10 mg Oral QPM Lavina Hamman, MD   10 mg at 07/02/22 1647   feeding supplement (ENSURE ENLIVE / ENSURE PLUS) liquid 237 mL  237 mL Oral BID BM Lavina Hamman, MD       hydrALAZINE (APRESOLINE) injection 5 mg  5 mg Intravenous Q4H PRN Emokpae, Ejiroghene E, MD   5 mg at 06/27/22 2315   ondansetron (ZOFRAN) tablet 4 mg  4 mg Oral Q6H PRN Emokpae, Ejiroghene E, MD       Or   ondansetron (ZOFRAN) injection 4 mg  4 mg Intravenous Q6H PRN Emokpae,  Ejiroghene E, MD       Oral care mouth rinse  15 mL Mouth Rinse 4 times per day Lavina Hamman, MD   15 mL at 07/02/22 2119   Oral care mouth rinse  15 mL Mouth Rinse PRN Lavina Hamman, MD       polyethylene glycol (MIRALAX / GLYCOLAX) packet 17 g  17 g Oral Daily PRN Emokpae, Ejiroghene E, MD       traMADol (ULTRAM) tablet 50 mg  50 mg Oral Q6H PRN Lavina Hamman, MD         Discharge Medications: acetaminophen 500 MG tablet Commonly known as: TYLENOL Take 1 tablet by mouth 3 (three) times daily as needed.    apixaban 5 MG Tabs tablet Commonly known as: ELIQUIS Take 1 tablet (5 mg total) by mouth 2 (two) times daily.    atorvastatin 10 MG tablet Commonly known as: LIPITOR Take 1 tablet by mouth at bedtime.    docusate sodium 100  MG capsule Commonly known as: Colace Take 1 capsule (100 mg total) by mouth 2 (two) times daily.    HYDROcodone-acetaminophen 5-325 MG tablet Commonly known as: NORCO/VICODIN Take 1 tablet by mouth 2 (two) times daily as needed for severe pain.    PARoxetine 10 MG tablet Commonly known as: PAXIL Take 10 mg by mouth daily.    polyethylene glycol 17 g packet Commonly known as: MIRALAX / GLYCOLAX Take 17 g by mouth daily as needed for mild constipation.    polyethylene glycol 17 g packet Commonly known as: MiraLax Take 17 g by mouth daily.    Vitamin D3 250 MCG (10000 UT) Tabs Take 1 tablet by mouth daily.    Relevant Imaging Results:  Relevant Lab Results:   Additional Information SSN: [id number], [place]

## 2022-07-03 NOTE — Evaluation (Signed)
Physical Therapy Evaluation Patient Details Name: Christie Nelson MRN: [id number] DOB: 1930-05-17 Today's Date: 07/03/2022  History of Present Illness  Christie Nelson is a 88 y.o. female s/p OPEN REDUCTION INTERNAL FIXATION (ORIF) WRIST FRACTURE (Right) on 06/29/22  with medical history significant for dementia, stroke, hypertension, on anticoagulation.  Patient was brought from ALF with reports of unwitnessed fall, she is reporting pain to her right shoulder and right jaw, right wrist.  My evaluation, patient is awake alert oriented to person and situation, but she also keeps repeating that her husband is coming back but she is unable to tell me his name.  She tells me she has been falling several times.  She denies chest pain, no difficulty breathing.  She does not know why she falls.   Clinical Impression  Patient demonstrates slow labored movement for sitting up at bedside with limited use of RUE due to wearing splint and NWB on wrist/hand.  Patient able to take a few steps at bedside resting right elbow on platform walker, but limited mostly due to fatigue and incontinence of urine, demonstrates fair return for transferring to/from [facility] using walker and having RUE supported by therapist without using an AD.  Patient tolerated sitting up in chair after therapy - nursing staff notified.  Patient will benefit from continued skilled physical therapy in hospital and recommended venue below to increase strength, balance, endurance for safe ADLs and gait.          Recommendations for follow up therapy are one component of a multi-disciplinary discharge planning process, led by the attending physician.  Recommendations may be updated based on patient status, additional functional criteria and insurance authorization.  Follow Up Recommendations Skilled nursing-short term rehab (<3 hours/day) Can patient physically be transported by private vehicle: Yes    Assistance Recommended at Discharge  Intermittent Supervision/Assistance  Patient can return home with the following  A lot of help with bathing/dressing/bathroom;A lot of help with walking and/or transfers;Help with stairs or ramp for entrance;Assistance with cooking/housework    Equipment Recommendations Other (comment) (Platform walker)  Recommendations for Other Services       Functional Status Assessment Patient has had a recent decline in their functional status and demonstrates the ability to make significant improvements in function in a reasonable and predictable amount of time.     Precautions / Restrictions Precautions Precautions: Fall Restrictions Weight Bearing Restrictions: Yes      Mobility  Bed Mobility Overal bed mobility: Needs Assistance Bed Mobility: Supine to Sit     Supine to sit: Mod assist, Max assist     General bed mobility comments: increased time, labored movement with HOB flat, limited use of RUE due to splint and pain    Transfers Overall transfer level: Needs assistance Equipment used: Right platform walker Transfers: Sit to/from Stand, Bed to chair/wheelchair/BSC Sit to Stand: Mod assist   Step pivot transfers: Mod assist       General transfer comment: slow labored movement resting right elbow on platform walker    Ambulation/Gait Ambulation/Gait assistance: Mod assist Gait Distance (Feet): 5 Feet Assistive device: Right platform walker Gait Pattern/deviations: Decreased step length - right, Decreased step length - left, Decreased stride length Gait velocity: slow     General Gait Details: limited to a few slow labored steps resting right elbow on platform walker, able to grip with left hand, limited mostly due to fatigue, poor standing balance and incontinent of urine  Stairs  Wheelchair Mobility    Modified Rankin (Stroke Patients Only)       Balance Overall balance assessment: Needs assistance Sitting-balance support: Feet supported, No  upper extremity supported Sitting balance-Leahy Scale: Fair Sitting balance - Comments: fair/good seated at EOB   Standing balance support: During functional activity, Bilateral upper extremity supported Standing balance-Leahy Scale: Poor Standing balance comment: fair/poor using Right platform walker                             Pertinent Vitals/Pain Pain Assessment Pain Assessment: Faces Faces Pain Scale: Hurts little more Pain Location: RUE with movemen Pain Descriptors / Indicators: Sore, Grimacing, Guarding Pain Intervention(s): Limited activity within patient's tolerance, Monitored during session, Repositioned    Home Living Family/patient expects to be discharged to:: Assisted living                 Home Equipment: Cane - single Barista (2 wheels) Additional Comments: raised toilet seat    Prior Function Prior Level of Function : Needs assist       Physical Assist : Mobility (physical) Mobility (physical): Bed mobility;Transfers;Gait;Stairs   Mobility Comments: household ambulator using RW ADLs Comments: assisted by ALF staff     Hand Dominance        Extremity/Trunk Assessment   Upper Extremity Assessment Upper Extremity Assessment: Defer to OT evaluation    Lower Extremity Assessment Lower Extremity Assessment: Generalized weakness    Cervical / Trunk Assessment Cervical / Trunk Assessment: Normal  Communication   Communication: No difficulties  Cognition Arousal/Alertness: Awake/alert Behavior During Therapy: WFL for tasks assessed/performed Overall Cognitive Status: History of cognitive impairments - at baseline                                          General Comments      Exercises     Assessment/Plan    PT Assessment Patient needs continued PT services  PT Problem List Decreased strength;Decreased activity tolerance;Decreased balance;Decreased mobility       PT Treatment Interventions  DME instruction;Gait training;Stair training;Functional mobility training;Therapeutic activities;Therapeutic exercise;Patient/family education;Balance training    PT Goals (Current goals can be found in the Care Plan section)  Acute Rehab PT Goals Patient Stated Goal: return home PT Goal Formulation: With patient Time For Goal Achievement: 07/12/22 Potential to Achieve Goals: Good    Frequency Min 3X/week     Co-evaluation               AM-PAC PT "6 Clicks" Mobility  Outcome Measure Help needed turning from your back to your side while in a flat bed without using bedrails?: A Lot Help needed moving from lying on your back to sitting on the side of a flat bed without using bedrails?: A Lot Help needed moving to and from a bed to a chair (including a wheelchair)?: A Lot Help needed standing up from a chair using your arms (e.g., wheelchair or bedside chair)?: A Lot Help needed to walk in hospital room?: A Lot Help needed climbing 3-5 steps with a railing? : A Lot 6 Click Score: 12    End of Session   Activity Tolerance: Patient tolerated treatment well;Patient limited by fatigue Patient left: in chair;with call bell/phone within reach;with chair alarm set Nurse Communication: Mobility status PT Visit Diagnosis: Unsteadiness on feet (R26.81);Other abnormalities of gait  and mobility (R26.89);Muscle weakness (generalized) (M62.81)    Time: 6754-4920 PT Time Calculation (min) (ACUTE ONLY): 36 min   Charges:   PT Evaluation $PT Eval Moderate Complexity: 1 Mod PT Treatments $Therapeutic Activity: 23-37 mins        12:00 PM, 07/03/22 Lonell Grandchild, MPT Physical Therapist with [facility] [phone] office [phone] mobile phone

## 2022-07-03 NOTE — Progress Notes (Signed)
Ng Discharge Note  Admit Date:  06/27/2022 Discharge date: 07/03/2022   [name] to be D/C'd Nursing Home per MD order.  AVS completed. Patient/caregiver able to verbalize understanding.  Discharge Medication: Allergies as of 07/03/2022   No Known Allergies      Medication List     STOP taking these medications    furosemide 20 MG tablet Commonly known as: LASIX   gabapentin 100 MG capsule Commonly known as: NEURONTIN   hydrochlorothiazide 12.5 MG tablet Commonly known as: HYDRODIURIL   potassium chloride SA 20 MEQ tablet Commonly known as: KLOR-CON M   QUEtiapine 25 MG tablet Commonly known as: SEROQUEL       TAKE these medications    acetaminophen 500 MG tablet Commonly known as: TYLENOL Take 1 tablet by mouth 3 (three) times daily as needed.   apixaban 5 MG Tabs tablet Commonly known as: ELIQUIS Take 1 tablet (5 mg total) by mouth 2 (two) times daily.   atorvastatin 10 MG tablet Commonly known as: LIPITOR Take 1 tablet by mouth at bedtime.   docusate sodium 100 MG capsule Commonly known as: Colace Take 1 capsule (100 mg total) by mouth 2 (two) times daily.   HYDROcodone-acetaminophen 5-325 MG tablet Commonly known as: NORCO/VICODIN Take 1 tablet by mouth 2 (two) times daily as needed for severe pain.   PARoxetine 10 MG tablet Commonly known as: PAXIL Take 10 mg by mouth daily.   polyethylene glycol 17 g packet Commonly known as: MIRALAX / GLYCOLAX Take 17 g by mouth daily as needed for mild constipation.   polyethylene glycol 17 g packet Commonly known as: MiraLax Take 17 g by mouth daily.   Vitamin D3 250 MCG (10000 UT) Tabs Take 1 tablet by mouth daily.               Discharge Care Instructions  (From admission, onward)           Start     Ordered   07/03/22 0000  Leave dressing on - Keep it clean, dry, and intact until clinic visit        07/03/22 1154            Discharge Assessment: Vitals:   07/03/22  0514 07/03/22 1318  BP: (!) 159/79 (!) 148/76  Pulse: 71 70  Resp: 16 17  Temp: (!) 97 F (36.1 C) 98 F (36.7 C)  SpO2: 93% 94%   Skin clean, dry and intact without evidence of skin break down, no evidence of skin tears noted. IV catheter discontinued intact. Site without signs and symptoms of complications - no redness or edema noted at insertion site, patient denies c/o pain - only slight tenderness at site.  Dressing with slight pressure applied.  D/c Instructions-Education: Discharge instructions given to patient/family with verbalized understanding. D/c education completed with patient/family including follow up instructions, medication list, d/c activities limitations if indicated, with other d/c instructions as indicated by MD - patient able to verbalize understanding, all questions fully answered. Patient instructed to return to ED, call 911, or call MD for any changes in condition.  Patient escorted via [place], and D/C home via private auto.  Richrd Prime, LPN 07/12/1116 3:56 PM

## 2022-07-03 NOTE — Discharge Summary (Addendum)
Physician Discharge Summary   Patient: Christie Nelson MRN: [id number] DOB: Jun 11, 1930  Admit date:     06/27/2022  Discharge date: 07/03/22  Discharge Physician: Berle Mull  PCP: Bonnita Nasuti, MD  Recommendations at discharge: Follow up with PCP in 1 week Follow up with Orthopedics in 1 week Speech therapy follow up at facility.    Follow-up Information     Hague, Rosalyn Charters, MD. Schedule an appointment as soon as possible for a visit in 1 week(s).   Specialty: Internal Medicine Contact information: [address] [place] [address] [phone]         Carole Civil, MD. Schedule an appointment as soon as possible for a visit in 1 week(s).   Specialties: Orthopedic Surgery, Radiology Contact information: [address] [place] [address] [phone]                Discharge Diagnoses: Principal Problem:   Right wrist fracture, open, initial encounter Active Problems:   Fall   Paroxysmal atrial fibrillation with RVR ([place])   Delirium superimposed on dementia   HTN (hypertension)   Pre-op evaluation   Goals of care, counseling/discussion   Sinus bradycardia   Hypokalemia   Hard of hearing   Acute postoperative anemia due to expected blood loss  Hospital Course: 88 year old female with PMH of dementia, CVA, HTN, chronic anticoagulation presented to the hospital from ALF with unwitnessed fall.  Found to have right wrist comminuted and displaced fracture of the distal radial metadiaphysis.  Also oblique fracture of distal third diaphysis.  Orthopedic consulted. Patient has acute delirium most likely secondary to pain and pain medication.  Prognosis guarded. Assessment and Plan  Right wrist fracture. Open wound. Orthopedic consulted.  Underwent surgical correction with ORIF on 10/20. Postop plan long-arm cast for 4 weeks short arm cast for 4 weeks images at 2 weeks 4 weeks 8 weeks and then as needed Antibiotics per surgery.    Preop clearance. Patient with past medical history of dementia as well as A-fib on chronic anticoagulation. Presented with unwitnessed fall. Currently does not appear to have any prohibitive risk factor that requires further work-up although per Lyndel Safe score, patient has 1.9 % Risk of myocardial infarction or cardiac arrest, intraoperatively or up to 30 days post-op. She remains also at high risk for delirium postoperatively. This was discussed with son.  Currently verbalized understanding.   Dementia with delirium. Patient has severe dementia.  Currently on Seroquel and Paxil Does not appear to have any behavioral issues. Intermittently unable to follow commands although there is a component of hearing difficulty as well. Patient was significantly drowsy and lethargic on 10/22 requiring a CT of the head which was unremarkable. Minimize narcotics and use scheduled Tylenol for pain. Stop gabapentin and seroquel. Resume paxil.    Goals of care conversation. Discussed with patient's son who has a POA. Patient is not in a condition to provide any information. Patient may have a living will which may have DNR on it for her son. Per my discussion with patient and son patient's prognosis is poor. Son does not want her to be on life support or does not want to break her ribs with CPR. Currently patient will be transitioned to DNR based on this conversation and family is wishes.   Acute urinary retention. In and out catheterization x1. Resolved    Unwitnessed fall. Etiology not clear. Potentially mechanical fall. CT head and CT C-spine unremarkable. Right shoulder and elbow x-ray also  negative for any acute abnormality. Negative facial x-rays negative for any acute fracture as well. PT OT consultation.   HTN. Blood pressure elevated. Patient is on diuretics are currently on hold.   Paroxysmal A-fib.  RVR Sinus Bradycardia on telemetry on 10/22 Post op pt had RVR on telemetry needing  Lopressor with holding parameters, Seen with bradycardia on 10/22.  Eliquid resumed. Hb stable. No RVR for now, will hold rate control meds, pt was not on one prior to coming to hospital as well.    Hypokalemia. Replaced.  Monitor.   Constipation  X ray shows mild constipation, will continue scheduled stool softeners.   Pain control - [employer] Controlled Substance Reporting System database was reviewed. and patient was instructed, not to drive, operate heavy machinery, perform activities at heights, swimming or participation in water activities or provide baby-sitting services while on Pain, Sleep and Anxiety Medications; until their outpatient Physician has advised to do so again. Also recommended to not to take more than prescribed Pain, Sleep and Anxiety Medications.  Consultants:  Orthopedics   Procedures performed:  OPEN REDUCTION INTERNAL FIXATION (ORIF) WRIST FRACTURE (Right)  DISCHARGE MEDICATION: Allergies as of 07/03/2022   No Known Allergies      Medication List     STOP taking these medications    furosemide 20 MG tablet Commonly known as: LASIX   gabapentin 100 MG capsule Commonly known as: NEURONTIN   hydrochlorothiazide 12.5 MG tablet Commonly known as: HYDRODIURIL   potassium chloride SA 20 MEQ tablet Commonly known as: KLOR-CON M   QUEtiapine 25 MG tablet Commonly known as: SEROQUEL       TAKE these medications    acetaminophen 500 MG tablet Commonly known as: TYLENOL Take 1 tablet by mouth 3 (three) times daily as needed.   apixaban 5 MG Tabs tablet Commonly known as: ELIQUIS Take 1 tablet (5 mg total) by mouth 2 (two) times daily.   atorvastatin 10 MG tablet Commonly known as: LIPITOR Take 1 tablet by mouth at bedtime.   docusate sodium 100 MG capsule Commonly known as: Colace Take 1 capsule (100 mg total) by mouth 2 (two) times daily.   HYDROcodone-acetaminophen 5-325 MG tablet Commonly known as: NORCO/VICODIN Take 1 tablet  by mouth 2 (two) times daily as needed for severe pain.   PARoxetine 10 MG tablet Commonly known as: PAXIL Take 10 mg by mouth daily.   polyethylene glycol 17 g packet Commonly known as: MIRALAX / GLYCOLAX Take 17 g by mouth daily as needed for mild constipation.   polyethylene glycol 17 g packet Commonly known as: MiraLax Take 17 g by mouth daily.   Vitamin D3 250 MCG (10000 UT) Tabs Take 1 tablet by mouth daily.               Discharge Care Instructions  (From admission, onward)           Start     Ordered   07/03/22 0000  Leave dressing on - Keep it clean, dry, and intact until clinic visit        07/03/22 1154           Disposition: ALF/ILF Diet recommendation: Dysphagia type 1 thin Liquid  Discharge Exam: Vitals:   07/02/22 1325 07/02/22 1535 07/02/22 2042 07/03/22 0514  BP: 129/71  130/71 (!) 159/79  Pulse: 67  78 71  Resp: '20  16 16  '$ Temp: (!) 97 F (36.1 C)  (!) 96.8 F (36 C) (!) 97 F (36.1  C)  TempSrc:      SpO2: 100% 98% 95% 93%  Weight:      Height:       General: Appear in mild distress; no visible Abnormal Neck Mass Or lumps, Conjunctiva normal Cardiovascular: S1 and S2 Present, no Murmur, Respiratory: good respiratory effort, Bilateral Air entry present and CTA, no Crackles, no wheezes Abdomen: Bowel Sound present, Non tender  Extremities: no Pedal edema Neurology: alert and oriented to self, hard to elicit as pt is very hard of hearing. Able to follow all commands.   Filed Weights   06/27/22 2258 06/29/22 1215 06/30/22 0500  Weight: 67 kg 67 kg 81.6 kg   Condition at discharge: stable  The results of significant diagnostics from this hospitalization (including imaging, microbiology, ancillary and laboratory) are listed below for reference.   Imaging Studies: CT HEAD WO CONTRAST (5MM)  Result Date: 07/01/2022 CLINICAL DATA:  Initial evaluation for mental status change, unknown cause. EXAM: CT HEAD WITHOUT CONTRAST  TECHNIQUE: Contiguous axial images were obtained from the base of the skull through the vertex without intravenous contrast. RADIATION DOSE REDUCTION: This exam was performed according to the departmental dose-optimization program which includes automated exposure control, adjustment of the mA and/or kV according to patient size and/or use of iterative reconstruction technique. COMPARISON:  Prior CT from 06/27/2022. FINDINGS: Brain: Cerebral volume within normal limits. Mild chronic small vessel ischemic disease for age. No acute intracranial hemorrhage. No acute large vessel territory infarct. No mass lesion, mass effect or midline shift. No hydrocephalus or extra-axial fluid collection. Vascular: No abnormal hyperdense vessel. Scattered vascular calcifications noted within the carotid siphons. Skull: Scalp soft tissues and calvarium demonstrate no acute finding. Sinuses/Orbits: Prior bilateral ocular lens replacement. Globes orbital soft tissues demonstrate no acute finding. Paranasal sinuses and mastoid air cells are largely clear. Other: None. IMPRESSION: 1. No acute intracranial abnormality. 2. Mild chronic small vessel ischemic disease for age. Electronically Signed   By: Jeannine Boga M.D.   On: 07/01/2022 20:27   DG Wrist 2 Views Right  Result Date: 06/29/2022 CLINICAL DATA:  Fluoroscopic assistance for internal fixation of fractures of right radius and ulna EXAM: RIGHT WRIST - 2 VIEW COMPARISON:  06/27/2022 FINDINGS: Fluoroscopic images show reduction and internal fixation of comminuted fractures of distal shaft of right radius and ulna. There is marked improvement in alignment of fracture fragments. Fluoroscopic time 69 seconds. Radiation dose 1.45 mGy. IMPRESSION: Fluoroscopic assistance was provided for reduction and internal fixation of comminuted fractures of distal right radius and ulna. Electronically Signed   By: Elmer Picker M.D.   On: 06/29/2022 16:19   DG C-Arm 1-60 Min-No  Report  Result Date: 06/29/2022 Fluoroscopy was utilized by the requesting physician.  No radiographic interpretation.   DG C-Arm 1-60 Min-No Report  Result Date: 06/29/2022 Fluoroscopy was utilized by the requesting physician.  No radiographic interpretation.   DG HIPS BILAT WITH PELVIS MIN 5 VIEWS  Result Date: 06/27/2022 CLINICAL DATA:  Unwitnessed fall. EXAM: DG HIP (WITH OR WITHOUT PELVIS) 5+V BILAT COMPARISON:  AP pelvis 01/31/2022 FINDINGS: Status post bilateral total hip arthroplasty. The right-sided arthroplasty hardware appears represent area of vision, with multiple superior right acetabular cup screws, a fractured cerclage wire in the region of the right greater trochanter, and multiple cerclage wires around a moderately long right femoral stem. There are chronic changes within the right acetabulum including a likely chronic fracture line lucency within the superomedial right acetabulum. No new lucency is seen around either total  hip arthroplasty to indicate hardware failure. No acute fracture or dislocation. IMPRESSION: 1. Status post bilateral total hip arthroplasty without evidence of hardware failure. 2. Chronic changes within the right acetabulum. No acute fracture is seen. Electronically Signed   By: Yvonne Kendall M.D.   On: 06/27/2022 18:11   DG Wrist 2 Views Right  Result Date: 06/27/2022 CLINICAL DATA:  Unwitnessed fall. Right wrist bandage with blood on bandage. EXAM: RIGHT WRIST - 2 VIEW COMPARISON:  None Available. FINDINGS: There is diffuse decreased bone mineralization. Oblique, mildly comminuted fracture of the distal radial metadiaphysis. There is 3 mm lateral cortical step-off of the distal fracture component at the lateral aspect of the fracture line but no cortical step-off at the medial aspect of the fracture line. There appears to be 2 mm volar cortical step-off on lateral view. Additional oblique acute fracture of the distal ulnar metadiaphysis. Approximately 7 mm  lateral and 3 mm dorsal displacement of the distal fracture component with respect to the proximal fracture component. 4 mm ulnar positive variance. Moderate to severe radiocarpal joint space narrowing. Widening of the scapholunate interval up to 4 mm. Moderate triscaphe and thumb carpometacarpal and mild-to-moderate second through fifth carpometacarpal joint space narrowing. IMPRESSION: 1. Acute, mildly comminuted, and mildly displaced fracture of the distal radial metadiaphysis. 2. Acute, mildly displaced oblique fracture of the distal ulnar metadiaphysis. Electronically Signed   By: Yvonne Kendall M.D.   On: 06/27/2022 18:04   DG Elbow Complete Right  Result Date: 06/27/2022 CLINICAL DATA:  Unwitnessed fall.  Pain. EXAM: RIGHT ELBOW - COMPLETE 3+ VIEW COMPARISON:  None Available. FINDINGS: There is diffuse decreased bone mineralization. No definite joint effusion. No acute fracture is seen. No dislocation. Minimal degenerative spurring at the peripheral aspect of the medial trochlea and coronoid process. Minimal chronic enthesopathic change at the common extensor origin at the lateral epicondyle. IMPRESSION: 1. Within limitations of diffuse decreased bone mineralization, no acute fracture is seen. 2. Minimal osteoarthritis. Electronically Signed   By: Yvonne Kendall M.D.   On: 06/27/2022 18:01   DG Shoulder Right  Result Date: 06/27/2022 CLINICAL DATA:  Unwitnessed fall.  Right shoulder pain. EXAM: RIGHT SHOULDER - 2+ VIEW COMPARISON:  None Available. FINDINGS: Mild inferior glenoid degenerative osteophytosis. Mild acromioclavicular joint space narrowing and peripheral osteophytosis. No acute fracture is seen. No dislocation. The visualized portion of the right lung is unremarkable. IMPRESSION: Mild acromioclavicular and glenohumeral osteoarthritis. No acute fracture is seen. Electronically Signed   By: Yvonne Kendall M.D.   On: 06/27/2022 17:59   DG Chest 1 View  Result Date: 06/27/2022 CLINICAL  DATA:  Unwitnessed fall. EXAM: CHEST  1 VIEW COMPARISON:  AP chest 01/31/2022 FINDINGS: Cardiac silhouette is again borderline enlarged. Moderate calcification is seen within the aortic arch. There is mild chronic bilateral interstitial thickening. No new focal airspace opacity. No pleural effusion pneumothorax. Moderate multilevel degenerative disc changes of the thoracic spine. IMPRESSION: 1. No acute lung process. 2. Mild chronic bilateral interstitial thickening, likely chronic interstitial scarring. Electronically Signed   By: Yvonne Kendall M.D.   On: 06/27/2022 17:58   CT Head Wo Contrast  Result Date: 06/27/2022 CLINICAL DATA:  Fall, head trauma EXAM: CT HEAD WITHOUT CONTRAST CT MAXILLOFACIAL WITHOUT CONTRAST CT CERVICAL SPINE WITHOUT CONTRAST TECHNIQUE: Multidetector CT imaging of the head, cervical spine, and maxillofacial structures were performed using the standard protocol without intravenous contrast. Multiplanar CT image reconstructions of the cervical spine and maxillofacial structures were also generated. RADIATION DOSE REDUCTION: This  exam was performed according to the departmental dose-optimization program which includes automated exposure control, adjustment of the mA and/or kV according to patient size and/or use of iterative reconstruction technique. COMPARISON:  CT head 01/31/2022 FINDINGS: CT HEAD FINDINGS Brain: Generalized atrophy. Moderate white matter hypodensity bilaterally unchanged. Chronic infarct right basal ganglia unchanged. Negative for acute infarct, hemorrhage, mass. Vascular: Negative for hyperdense vessel Skull: Negative Other: None CT MAXILLOFACIAL FINDINGS Osseous: Negative for acute fracture. Chronic healed fracture left zygomatic arch unchanged from the prior head CT Orbits: Bilateral cataract extraction.  No orbital mass or edema. Sinuses: Nasal sinuses clear. Visualized mastoid and middle ear clear. Soft tissues: No soft tissue mass or edema in the face. CT CERVICAL  SPINE FINDINGS Alignment: Mild retrolisthesis C3-4 and C5-6 Skull base and vertebrae: Negative for cervical spine fracture Soft tissues and spinal canal: Negative for soft tissue mass or edema in the neck. Disc levels: Disc degeneration and spurring most prominent C3-4 and C5-6. Mild spinal stenosis and foraminal stenosis at C3-4 and C5-6 Upper chest: Visualized lung apices clear Other: None IMPRESSION: 1. No acute intracranial abnormality. Atrophy and chronic microvascular ischemia. 2. Negative for acute facial fracture. 3. Negative for cervical spine fracture. Electronically Signed   By: Franchot Gallo M.D.   On: 06/27/2022 17:44   CT Cervical Spine Wo Contrast  Result Date: 06/27/2022 CLINICAL DATA:  Fall, head trauma EXAM: CT HEAD WITHOUT CONTRAST CT MAXILLOFACIAL WITHOUT CONTRAST CT CERVICAL SPINE WITHOUT CONTRAST TECHNIQUE: Multidetector CT imaging of the head, cervical spine, and maxillofacial structures were performed using the standard protocol without intravenous contrast. Multiplanar CT image reconstructions of the cervical spine and maxillofacial structures were also generated. RADIATION DOSE REDUCTION: This exam was performed according to the departmental dose-optimization program which includes automated exposure control, adjustment of the mA and/or kV according to patient size and/or use of iterative reconstruction technique. COMPARISON:  CT head 01/31/2022 FINDINGS: CT HEAD FINDINGS Brain: Generalized atrophy. Moderate white matter hypodensity bilaterally unchanged. Chronic infarct right basal ganglia unchanged. Negative for acute infarct, hemorrhage, mass. Vascular: Negative for hyperdense vessel Skull: Negative Other: None CT MAXILLOFACIAL FINDINGS Osseous: Negative for acute fracture. Chronic healed fracture left zygomatic arch unchanged from the prior head CT Orbits: Bilateral cataract extraction.  No orbital mass or edema. Sinuses: Nasal sinuses clear. Visualized mastoid and middle ear  clear. Soft tissues: No soft tissue mass or edema in the face. CT CERVICAL SPINE FINDINGS Alignment: Mild retrolisthesis C3-4 and C5-6 Skull base and vertebrae: Negative for cervical spine fracture Soft tissues and spinal canal: Negative for soft tissue mass or edema in the neck. Disc levels: Disc degeneration and spurring most prominent C3-4 and C5-6. Mild spinal stenosis and foraminal stenosis at C3-4 and C5-6 Upper chest: Visualized lung apices clear Other: None IMPRESSION: 1. No acute intracranial abnormality. Atrophy and chronic microvascular ischemia. 2. Negative for acute facial fracture. 3. Negative for cervical spine fracture. Electronically Signed   By: Franchot Gallo M.D.   On: 06/27/2022 17:44   CT Maxillofacial WO CM  Result Date: 06/27/2022 CLINICAL DATA:  Fall, head trauma EXAM: CT HEAD WITHOUT CONTRAST CT MAXILLOFACIAL WITHOUT CONTRAST CT CERVICAL SPINE WITHOUT CONTRAST TECHNIQUE: Multidetector CT imaging of the head, cervical spine, and maxillofacial structures were performed using the standard protocol without intravenous contrast. Multiplanar CT image reconstructions of the cervical spine and maxillofacial structures were also generated. RADIATION DOSE REDUCTION: This exam was performed according to the departmental dose-optimization program which includes automated exposure control, adjustment of the mA and/or  kV according to patient size and/or use of iterative reconstruction technique. COMPARISON:  CT head 01/31/2022 FINDINGS: CT HEAD FINDINGS Brain: Generalized atrophy. Moderate white matter hypodensity bilaterally unchanged. Chronic infarct right basal ganglia unchanged. Negative for acute infarct, hemorrhage, mass. Vascular: Negative for hyperdense vessel Skull: Negative Other: None CT MAXILLOFACIAL FINDINGS Osseous: Negative for acute fracture. Chronic healed fracture left zygomatic arch unchanged from the prior head CT Orbits: Bilateral cataract extraction.  No orbital mass or edema.  Sinuses: Nasal sinuses clear. Visualized mastoid and middle ear clear. Soft tissues: No soft tissue mass or edema in the face. CT CERVICAL SPINE FINDINGS Alignment: Mild retrolisthesis C3-4 and C5-6 Skull base and vertebrae: Negative for cervical spine fracture Soft tissues and spinal canal: Negative for soft tissue mass or edema in the neck. Disc levels: Disc degeneration and spurring most prominent C3-4 and C5-6. Mild spinal stenosis and foraminal stenosis at C3-4 and C5-6 Upper chest: Visualized lung apices clear Other: None IMPRESSION: 1. No acute intracranial abnormality. Atrophy and chronic microvascular ischemia. 2. Negative for acute facial fracture. 3. Negative for cervical spine fracture. Electronically Signed   By: Franchot Gallo M.D.   On: 06/27/2022 17:44    Microbiology: Results for orders placed or performed during the hospital encounter of 06/27/22  Surgical pcr screen     Status: None   Collection Time: 06/28/22 11:26 PM   Specimen: Nasal Mucosa; Nasal Swab  Result Value Ref Range Status   MRSA, PCR NEGATIVE NEGATIVE Final   Staphylococcus aureus NEGATIVE NEGATIVE Final    Comment: (NOTE) The Xpert SA Assay (FDA approved for NASAL specimens in patients 62 years of age and older), is one component of a comprehensive surveillance program. It is not intended to diagnose infection nor to guide or monitor treatment. Performed at [facility], [address] [place], [place] [address]    Labs: CBC: Recent Labs  Lab 06/27/22 1622 06/29/22 0757 07/01/22 0837 07/02/22 0515 07/03/22 0510  WBC 9.0 7.5 8.8 5.3 5.6  NEUTROABS 7.3 5.2  --   --   --   HGB 13.8 13.6 10.5* 10.1* 10.8*  HCT 41.8 42.2 33.2* 32.6* 34.0*  MCV 91.7 92.1 94.6 94.5 92.6  PLT 175 173 156 154 193   Basic Metabolic Panel: Recent Labs  Lab 06/27/22 1800 06/29/22 0757 07/01/22 0837 07/03/22 0510  NA 139 139 137 140  K 3.5 3.3* 4.5 3.6  CL 105 102 103 108  CO2 '25 29 28 26  '$ GLUCOSE 133* 128* 131* 112*   BUN '18 10 22 22  '$ CREATININE 1.11* 0.80 0.89 0.71  CALCIUM 9.6 9.6 9.4 9.3  MG  --  1.8 2.3 2.1   Liver Function Tests: Recent Labs  Lab 06/27/22 1800  AST 24  ALT 15  ALKPHOS 117  BILITOT 0.9  PROT 7.3  ALBUMIN 4.0   CBG: Recent Labs  Lab 07/01/22 1422  GLUCAP 116*    Discharge time spent: greater than 30 minutes.  Signed: Berle Mull, MD [facility]

## 2022-07-03 NOTE — TOC Transition Note (Signed)
Transition of Care [facility]) - CM/SW Discharge Note   Patient Details  Name: Christie Nelson MRN: [id number] Date of Birth: April 23, 1930  [facility] of [facility]) CM/SW Contact:  Iona Beard, [place] Phone Number: 07/03/2022, 3:17 PM   Clinical Narrative:    CSW updated of pts readiness for D/C. CSW sent D/C paperwork and PT notes to ALF and spoke with Isaiah Blakes at ALF who states she would review and update CSW once they had been reviewed to confirm they could accept pt.   CSW received call that pt arrived at facility via EMS transport. Facility had not received D/C paperwork via fax. CSW spoke to [place] and requested email to send paperwork. CSW sent Juliann Pulse D/C paperwork via [place] email. CSW gave referral for rolling walker with platform to [place] with [employer] and asked that they deliver to facility as pt was D/C as soon as possible. CSW confirmed with Juliann Pulse that she received D/C paperwork. CSW spoke with Juliann Pulse about [facility] being set up. CSW reached out to Judson Roch with [facility] at facility request for [facility] set up. TOC signing off.   Final next level of care: Assisted Living Barriers to Discharge: Barriers Resolved   Patient Goals and CMS Choice Patient states their goals for this hospitalization and ongoing recovery are:: go home      Discharge Placement                       Discharge Plan and Services In-house Referral: Clinical Social Work                                   Social Determinants of Health (SDOH) Interventions     Readmission Risk Interventions     No data to display

## 2022-07-03 NOTE — TOC Progression Note (Signed)
Transition of Care [facility]) - Progression Note    Patient Details  Name: Christie Nelson MRN: [id number] Date of Birth: 28-Feb-1930  Transition of Care [facility]) CM/SW [place], [place] Phone Number: 07/03/2022, 10:07 AM  Clinical Narrative:    CSW left voicemail with Juliann Pulse at [facility]F requesting call back. CSW spoke with pts son Areta Terwilliger who is first contact to gain information on wishes for D/C. Mr. Bias states that they would like pt to return to ALF if possible as this is her well known environment. CSW to follow up with ALF and update family as well. TOC to follow.   Expected Discharge Plan: Assisted Living Barriers to Discharge: Continued Medical Work up  Expected Discharge Plan and Services Expected Discharge Plan: Assisted Living In-house Referral: Clinical Social Work     Living arrangements for the past 2 months: Assisted Living Facility                                       Social Determinants of Health (SDOH) Interventions    Readmission Risk Interventions     No data to display

## 2022-07-03 NOTE — Plan of Care (Signed)
  Problem: Acute Rehab PT Goals(only PT should resolve) Goal: Pt Will Go Supine/Side To Sit Outcome: Progressing Flowsheets (Taken 07/03/2022 1202) Pt will go Supine/Side to Sit:  with moderate assist  with minimal assist Goal: Patient Will Transfer Sit To/From Stand Outcome: Progressing Flowsheets (Taken 07/03/2022 1202) Patient will transfer sit to/from stand:  with minimal assist  with moderate assist Goal: Pt Will Transfer Bed To Chair/Chair To Bed Outcome: Progressing Flowsheets (Taken 07/03/2022 1202) Pt will Transfer Bed to Chair/Chair to Bed:  with min assist  with mod assist Goal: Pt Will Ambulate Outcome: Progressing Flowsheets (Taken 07/03/2022 1202) Pt will Ambulate:  25 feet  with moderate assist  with minimal assist  with other equipment (comment) Note: Platform walker   12:03 PM, 07/03/22 Lonell Grandchild, MPT Physical Therapist with [facility] [phone] office [phone] mobile phone

## 2022-07-03 NOTE — Progress Notes (Signed)
Patient ID: Christie Nelson, female   DOB: 1930/09/07, 88 y.o.   MRN: [id number]   Physician Discharge Summary  Patient ID: Christie Nelson MRN: [id number] DOB/AGE: Jan 03, 1930 88 y.o.  Admit date: 06/27/2022 Discharge date: 07/03/2022   Postop plan long-arm cast for 4 weeks short arm cast for 4 weeks images at 2 weeks 4 weeks 8 weeks and then as needed   Follow up [place] EVERYTHING ON AS IS   DO NOT [place]    Discharge Exam: BP (!) 159/79 (BP Location: Left Arm)   Pulse 71   Temp (!) 97 F (36.1 C)   Resp 16   Ht '5\' 6"'$  (1.676 m)   Wt 81.6 kg   SpO2 93%   BMI 29.04 kg/m  Physical Exam    Disposition:   Discharge Instructions     DIET - DYS 1   Complete by: As directed    Fluid consistency: Thin   Increase activity slowly   Complete by: As directed    Leave dressing on - Keep it clean, dry, and intact until clinic visit   Complete by: As directed       Allergies as of 07/03/2022   No Known Allergies      Medication List     STOP taking these medications    furosemide 20 MG tablet Commonly known as: LASIX   gabapentin 100 MG capsule Commonly known as: NEURONTIN   hydrochlorothiazide 12.5 MG tablet Commonly known as: HYDRODIURIL   potassium chloride SA 20 MEQ tablet Commonly known as: KLOR-CON M   QUEtiapine 25 MG tablet Commonly known as: SEROQUEL       TAKE these medications    acetaminophen 500 MG tablet Commonly known as: TYLENOL Take 1 tablet by mouth 3 (three) times daily as needed.   apixaban 5 MG Tabs tablet Commonly known as: ELIQUIS Take 1 tablet (5 mg total) by mouth 2 (two) times daily.   atorvastatin 10 MG tablet Commonly known as: LIPITOR Take 1 tablet by mouth at bedtime.   HYDROcodone-acetaminophen 5-325 MG tablet Commonly known as: NORCO/VICODIN Take 1 tablet by mouth 2 (two) times daily as needed for severe pain.   PARoxetine 10 MG tablet Commonly known as: PAXIL Take 10  mg by mouth daily.   polyethylene glycol 17 g packet Commonly known as: MIRALAX / GLYCOLAX Take 17 g by mouth daily as needed for mild constipation.   Vitamin D3 250 MCG (10000 UT) Tabs Take 1 tablet by mouth daily.               Discharge Care Instructions  (From admission, onward)           Start     Ordered   07/03/22 0000  Leave dressing on - Keep it clean, dry, and intact until clinic visit        07/03/22 1154            Follow-up Information     Hague, Rosalyn Charters, MD. Schedule an appointment as soon as possible for a visit in 1 week(s).   Specialty: Internal Medicine Contact information: [address] [place] [address] [phone]         Carole Civil, MD. Schedule an appointment as soon as possible for a visit in 1 week(s).   Specialties: Orthopedic Surgery, Radiology Contact information: [address] [place] [address] [phone]  Signed: Arther Abbott 07/03/2022, 11:57 AM

## 2022-07-04 DIAGNOSIS — R69 Illness, unspecified: Secondary | ICD-10-CM | POA: Diagnosis not present

## 2022-07-05 ENCOUNTER — Telehealth: Payer: Self-pay | Admitting: Radiology

## 2022-07-05 DIAGNOSIS — W19XXXD Unspecified fall, subsequent encounter: Secondary | ICD-10-CM | POA: Diagnosis not present

## 2022-07-05 DIAGNOSIS — F03918 Unspecified dementia, unspecified severity, with other behavioral disturbance: Secondary | ICD-10-CM | POA: Diagnosis not present

## 2022-07-05 DIAGNOSIS — E876 Hypokalemia: Secondary | ICD-10-CM | POA: Diagnosis not present

## 2022-07-05 DIAGNOSIS — R001 Bradycardia, unspecified: Secondary | ICD-10-CM | POA: Diagnosis not present

## 2022-07-05 DIAGNOSIS — R443 Hallucinations, unspecified: Secondary | ICD-10-CM | POA: Diagnosis not present

## 2022-07-05 DIAGNOSIS — I10 Essential (primary) hypertension: Secondary | ICD-10-CM | POA: Diagnosis not present

## 2022-07-05 DIAGNOSIS — R69 Illness, unspecified: Secondary | ICD-10-CM | POA: Diagnosis not present

## 2022-07-05 DIAGNOSIS — S62101D Fracture of unspecified carpal bone, right wrist, subsequent encounter for fracture with routine healing: Secondary | ICD-10-CM | POA: Diagnosis not present

## 2022-07-05 DIAGNOSIS — I480 Paroxysmal atrial fibrillation: Secondary | ICD-10-CM | POA: Diagnosis not present

## 2022-07-05 DIAGNOSIS — F419 Anxiety disorder, unspecified: Secondary | ICD-10-CM | POA: Diagnosis not present

## 2022-07-05 DIAGNOSIS — Z7901 Long term (current) use of anticoagulants: Secondary | ICD-10-CM | POA: Diagnosis not present

## 2022-07-05 DIAGNOSIS — D62 Acute posthemorrhagic anemia: Secondary | ICD-10-CM | POA: Diagnosis not present

## 2022-07-05 DIAGNOSIS — F5101 Primary insomnia: Secondary | ICD-10-CM | POA: Diagnosis not present

## 2022-07-05 NOTE — Telephone Encounter (Signed)
Christie Nelson called, LMVM asking if she could sched an appt with Dr Aline Brochure for patient.  Now in their care, had surgery.  Please call [phone].  I called, rang then "call cannot be completed at this time".  We just need to try to call again.

## 2022-07-05 NOTE — Telephone Encounter (Signed)
Scheduled

## 2022-07-06 ENCOUNTER — Telehealth: Payer: Self-pay | Admitting: Radiology

## 2022-07-06 DIAGNOSIS — R69 Illness, unspecified: Secondary | ICD-10-CM | POA: Diagnosis not present

## 2022-07-06 NOTE — Telephone Encounter (Signed)
Christy called, LMVM wanting to clarify some orders.  Also wants to know patient's upcoming/next visit date.  Please call her.

## 2022-07-06 NOTE — Telephone Encounter (Signed)
Done

## 2022-07-06 NOTE — Telephone Encounter (Addendum)
Not much they can do with patient / wound is under cast  Fax orders if needed for wound care to [phone] if wound care is needed after cast off

## 2022-07-07 DIAGNOSIS — R69 Illness, unspecified: Secondary | ICD-10-CM | POA: Diagnosis not present

## 2022-07-08 DIAGNOSIS — R69 Illness, unspecified: Secondary | ICD-10-CM | POA: Diagnosis not present

## 2022-07-09 ENCOUNTER — Encounter (HOSPITAL_COMMUNITY): Payer: Self-pay | Admitting: Orthopedic Surgery

## 2022-07-09 DIAGNOSIS — I10 Essential (primary) hypertension: Secondary | ICD-10-CM | POA: Diagnosis not present

## 2022-07-09 DIAGNOSIS — R69 Illness, unspecified: Secondary | ICD-10-CM | POA: Diagnosis not present

## 2022-07-10 DIAGNOSIS — R69 Illness, unspecified: Secondary | ICD-10-CM | POA: Diagnosis not present

## 2022-07-10 DIAGNOSIS — H9190 Unspecified hearing loss, unspecified ear: Secondary | ICD-10-CM | POA: Diagnosis not present

## 2022-07-10 DIAGNOSIS — J302 Other seasonal allergic rhinitis: Secondary | ICD-10-CM | POA: Diagnosis not present

## 2022-07-10 DIAGNOSIS — E559 Vitamin D deficiency, unspecified: Secondary | ICD-10-CM | POA: Diagnosis not present

## 2022-07-10 DIAGNOSIS — G629 Polyneuropathy, unspecified: Secondary | ICD-10-CM | POA: Diagnosis not present

## 2022-07-10 DIAGNOSIS — I739 Peripheral vascular disease, unspecified: Secondary | ICD-10-CM | POA: Diagnosis not present

## 2022-07-10 DIAGNOSIS — E785 Hyperlipidemia, unspecified: Secondary | ICD-10-CM | POA: Diagnosis not present

## 2022-07-10 DIAGNOSIS — R296 Repeated falls: Secondary | ICD-10-CM | POA: Diagnosis not present

## 2022-07-11 DIAGNOSIS — S62101D Fracture of unspecified carpal bone, right wrist, subsequent encounter for fracture with routine healing: Secondary | ICD-10-CM | POA: Diagnosis not present

## 2022-07-11 DIAGNOSIS — I10 Essential (primary) hypertension: Secondary | ICD-10-CM | POA: Diagnosis not present

## 2022-07-11 DIAGNOSIS — R69 Illness, unspecified: Secondary | ICD-10-CM | POA: Diagnosis not present

## 2022-07-11 DIAGNOSIS — I480 Paroxysmal atrial fibrillation: Secondary | ICD-10-CM | POA: Diagnosis not present

## 2022-07-11 DIAGNOSIS — W19XXXD Unspecified fall, subsequent encounter: Secondary | ICD-10-CM | POA: Diagnosis not present

## 2022-07-11 DIAGNOSIS — E876 Hypokalemia: Secondary | ICD-10-CM | POA: Diagnosis not present

## 2022-07-11 DIAGNOSIS — D62 Acute posthemorrhagic anemia: Secondary | ICD-10-CM | POA: Diagnosis not present

## 2022-07-11 DIAGNOSIS — Z7901 Long term (current) use of anticoagulants: Secondary | ICD-10-CM | POA: Diagnosis not present

## 2022-07-11 DIAGNOSIS — R001 Bradycardia, unspecified: Secondary | ICD-10-CM | POA: Diagnosis not present

## 2022-07-12 ENCOUNTER — Encounter: Payer: Medicare HMO | Admitting: Orthopedic Surgery

## 2022-07-12 DIAGNOSIS — D62 Acute posthemorrhagic anemia: Secondary | ICD-10-CM | POA: Diagnosis not present

## 2022-07-12 DIAGNOSIS — R69 Illness, unspecified: Secondary | ICD-10-CM | POA: Diagnosis not present

## 2022-07-12 DIAGNOSIS — S62101D Fracture of unspecified carpal bone, right wrist, subsequent encounter for fracture with routine healing: Secondary | ICD-10-CM | POA: Diagnosis not present

## 2022-07-12 DIAGNOSIS — E876 Hypokalemia: Secondary | ICD-10-CM | POA: Diagnosis not present

## 2022-07-12 DIAGNOSIS — W19XXXD Unspecified fall, subsequent encounter: Secondary | ICD-10-CM | POA: Diagnosis not present

## 2022-07-12 DIAGNOSIS — I480 Paroxysmal atrial fibrillation: Secondary | ICD-10-CM | POA: Diagnosis not present

## 2022-07-12 DIAGNOSIS — R001 Bradycardia, unspecified: Secondary | ICD-10-CM | POA: Diagnosis not present

## 2022-07-12 DIAGNOSIS — I10 Essential (primary) hypertension: Secondary | ICD-10-CM | POA: Diagnosis not present

## 2022-07-12 DIAGNOSIS — Z7901 Long term (current) use of anticoagulants: Secondary | ICD-10-CM | POA: Diagnosis not present

## 2022-07-13 ENCOUNTER — Ambulatory Visit (INDEPENDENT_AMBULATORY_CARE_PROVIDER_SITE_OTHER): Payer: Medicare HMO

## 2022-07-13 ENCOUNTER — Ambulatory Visit (INDEPENDENT_AMBULATORY_CARE_PROVIDER_SITE_OTHER): Payer: Medicare HMO | Admitting: Orthopedic Surgery

## 2022-07-13 ENCOUNTER — Encounter (HOSPITAL_COMMUNITY): Payer: Self-pay | Admitting: Orthopedic Surgery

## 2022-07-13 DIAGNOSIS — S62101D Fracture of unspecified carpal bone, right wrist, subsequent encounter for fracture with routine healing: Secondary | ICD-10-CM | POA: Diagnosis not present

## 2022-07-13 DIAGNOSIS — I480 Paroxysmal atrial fibrillation: Secondary | ICD-10-CM | POA: Diagnosis not present

## 2022-07-13 DIAGNOSIS — I10 Essential (primary) hypertension: Secondary | ICD-10-CM | POA: Diagnosis not present

## 2022-07-13 DIAGNOSIS — R69 Illness, unspecified: Secondary | ICD-10-CM | POA: Diagnosis not present

## 2022-07-13 DIAGNOSIS — Z7901 Long term (current) use of anticoagulants: Secondary | ICD-10-CM | POA: Diagnosis not present

## 2022-07-13 DIAGNOSIS — D62 Acute posthemorrhagic anemia: Secondary | ICD-10-CM | POA: Diagnosis not present

## 2022-07-13 DIAGNOSIS — E876 Hypokalemia: Secondary | ICD-10-CM | POA: Diagnosis not present

## 2022-07-13 DIAGNOSIS — W19XXXD Unspecified fall, subsequent encounter: Secondary | ICD-10-CM | POA: Diagnosis not present

## 2022-07-13 DIAGNOSIS — R001 Bradycardia, unspecified: Secondary | ICD-10-CM | POA: Diagnosis not present

## 2022-07-13 NOTE — Progress Notes (Signed)
POST OP VISIT   Encounter Diagnosis  Name Primary?   Open wrist fracture, right, with routine healing, subsequent encounter ORIF 06/29/22 Yes    Procedure  PRE-OPERATIVE DIAGNOSIS: Right wrist fracture radius and ulna POST-OPERATIVE DIAGNOSIS: Open fracture right wrist fracture radius and ulna     PROCEDURE:  Procedure(s): OPEN REDUCTION INTERNAL FIXATION (ORIF) WRIST FRACTURE (Right)   Implants Arthrex T plate 2.7 with multiple screws in the shaft and metaphyseal region combination of locking and nonlocking on the distal radius fracture   Hook 2.7 plate on the ulna with interfrag screw combination of locking and nonlocking screws   Findings severely comminuted fracture distal radius and ulna Left wrist  POD  # 14 The wounds were checked and the staples were removed.  The skin tear has healed the open fracture shows noes sign of infection while all the wounds of healed  The x-ray shows that the fracture alignment is intact it is very comminuted at the fracture site but the hardware remains stable and the overall alignment of the wrist looks pretty good.  Plan is to place the patient in a new long-arm cast and then do an x-ray in another 2 weeks

## 2022-07-14 DIAGNOSIS — R69 Illness, unspecified: Secondary | ICD-10-CM | POA: Diagnosis not present

## 2022-07-15 DIAGNOSIS — R69 Illness, unspecified: Secondary | ICD-10-CM | POA: Diagnosis not present

## 2022-07-16 DIAGNOSIS — M79671 Pain in right foot: Secondary | ICD-10-CM | POA: Diagnosis not present

## 2022-07-16 DIAGNOSIS — L600 Ingrowing nail: Secondary | ICD-10-CM | POA: Diagnosis not present

## 2022-07-16 DIAGNOSIS — R69 Illness, unspecified: Secondary | ICD-10-CM | POA: Diagnosis not present

## 2022-07-16 DIAGNOSIS — B351 Tinea unguium: Secondary | ICD-10-CM | POA: Diagnosis not present

## 2022-07-16 DIAGNOSIS — M79672 Pain in left foot: Secondary | ICD-10-CM | POA: Diagnosis not present

## 2022-07-17 DIAGNOSIS — R69 Illness, unspecified: Secondary | ICD-10-CM | POA: Diagnosis not present

## 2022-07-17 DIAGNOSIS — D62 Acute posthemorrhagic anemia: Secondary | ICD-10-CM | POA: Diagnosis not present

## 2022-07-17 DIAGNOSIS — R001 Bradycardia, unspecified: Secondary | ICD-10-CM | POA: Diagnosis not present

## 2022-07-17 DIAGNOSIS — W19XXXD Unspecified fall, subsequent encounter: Secondary | ICD-10-CM | POA: Diagnosis not present

## 2022-07-17 DIAGNOSIS — S62101D Fracture of unspecified carpal bone, right wrist, subsequent encounter for fracture with routine healing: Secondary | ICD-10-CM | POA: Diagnosis not present

## 2022-07-17 DIAGNOSIS — E876 Hypokalemia: Secondary | ICD-10-CM | POA: Diagnosis not present

## 2022-07-17 DIAGNOSIS — Z7901 Long term (current) use of anticoagulants: Secondary | ICD-10-CM | POA: Diagnosis not present

## 2022-07-17 DIAGNOSIS — I480 Paroxysmal atrial fibrillation: Secondary | ICD-10-CM | POA: Diagnosis not present

## 2022-07-17 DIAGNOSIS — I10 Essential (primary) hypertension: Secondary | ICD-10-CM | POA: Diagnosis not present

## 2022-07-18 DIAGNOSIS — I10 Essential (primary) hypertension: Secondary | ICD-10-CM | POA: Diagnosis not present

## 2022-07-18 DIAGNOSIS — E782 Mixed hyperlipidemia: Secondary | ICD-10-CM | POA: Diagnosis not present

## 2022-07-18 DIAGNOSIS — E119 Type 2 diabetes mellitus without complications: Secondary | ICD-10-CM | POA: Diagnosis not present

## 2022-07-18 DIAGNOSIS — R69 Illness, unspecified: Secondary | ICD-10-CM | POA: Diagnosis not present

## 2022-07-18 DIAGNOSIS — E559 Vitamin D deficiency, unspecified: Secondary | ICD-10-CM | POA: Diagnosis not present

## 2022-07-18 DIAGNOSIS — D518 Other vitamin B12 deficiency anemias: Secondary | ICD-10-CM | POA: Diagnosis not present

## 2022-07-18 DIAGNOSIS — E038 Other specified hypothyroidism: Secondary | ICD-10-CM | POA: Diagnosis not present

## 2022-07-19 DIAGNOSIS — Z7901 Long term (current) use of anticoagulants: Secondary | ICD-10-CM | POA: Diagnosis not present

## 2022-07-19 DIAGNOSIS — I480 Paroxysmal atrial fibrillation: Secondary | ICD-10-CM | POA: Diagnosis not present

## 2022-07-19 DIAGNOSIS — D62 Acute posthemorrhagic anemia: Secondary | ICD-10-CM | POA: Diagnosis not present

## 2022-07-19 DIAGNOSIS — W19XXXD Unspecified fall, subsequent encounter: Secondary | ICD-10-CM | POA: Diagnosis not present

## 2022-07-19 DIAGNOSIS — I10 Essential (primary) hypertension: Secondary | ICD-10-CM | POA: Diagnosis not present

## 2022-07-19 DIAGNOSIS — R69 Illness, unspecified: Secondary | ICD-10-CM | POA: Diagnosis not present

## 2022-07-19 DIAGNOSIS — S62101D Fracture of unspecified carpal bone, right wrist, subsequent encounter for fracture with routine healing: Secondary | ICD-10-CM | POA: Diagnosis not present

## 2022-07-19 DIAGNOSIS — R001 Bradycardia, unspecified: Secondary | ICD-10-CM | POA: Diagnosis not present

## 2022-07-19 DIAGNOSIS — E876 Hypokalemia: Secondary | ICD-10-CM | POA: Diagnosis not present

## 2022-07-20 DIAGNOSIS — E876 Hypokalemia: Secondary | ICD-10-CM | POA: Diagnosis not present

## 2022-07-20 DIAGNOSIS — I480 Paroxysmal atrial fibrillation: Secondary | ICD-10-CM | POA: Diagnosis not present

## 2022-07-20 DIAGNOSIS — R69 Illness, unspecified: Secondary | ICD-10-CM | POA: Diagnosis not present

## 2022-07-20 DIAGNOSIS — I10 Essential (primary) hypertension: Secondary | ICD-10-CM | POA: Diagnosis not present

## 2022-07-20 DIAGNOSIS — Z7901 Long term (current) use of anticoagulants: Secondary | ICD-10-CM | POA: Diagnosis not present

## 2022-07-20 DIAGNOSIS — D62 Acute posthemorrhagic anemia: Secondary | ICD-10-CM | POA: Diagnosis not present

## 2022-07-20 DIAGNOSIS — W19XXXD Unspecified fall, subsequent encounter: Secondary | ICD-10-CM | POA: Diagnosis not present

## 2022-07-20 DIAGNOSIS — R001 Bradycardia, unspecified: Secondary | ICD-10-CM | POA: Diagnosis not present

## 2022-07-20 DIAGNOSIS — S62101D Fracture of unspecified carpal bone, right wrist, subsequent encounter for fracture with routine healing: Secondary | ICD-10-CM | POA: Diagnosis not present

## 2022-07-21 DIAGNOSIS — R69 Illness, unspecified: Secondary | ICD-10-CM | POA: Diagnosis not present

## 2022-07-22 DIAGNOSIS — R69 Illness, unspecified: Secondary | ICD-10-CM | POA: Diagnosis not present

## 2022-07-23 DIAGNOSIS — S62101D Fracture of unspecified carpal bone, right wrist, subsequent encounter for fracture with routine healing: Secondary | ICD-10-CM | POA: Diagnosis not present

## 2022-07-23 DIAGNOSIS — D62 Acute posthemorrhagic anemia: Secondary | ICD-10-CM | POA: Diagnosis not present

## 2022-07-23 DIAGNOSIS — I480 Paroxysmal atrial fibrillation: Secondary | ICD-10-CM | POA: Diagnosis not present

## 2022-07-23 DIAGNOSIS — I10 Essential (primary) hypertension: Secondary | ICD-10-CM | POA: Diagnosis not present

## 2022-07-23 DIAGNOSIS — Z7901 Long term (current) use of anticoagulants: Secondary | ICD-10-CM | POA: Diagnosis not present

## 2022-07-23 DIAGNOSIS — R001 Bradycardia, unspecified: Secondary | ICD-10-CM | POA: Diagnosis not present

## 2022-07-23 DIAGNOSIS — W19XXXD Unspecified fall, subsequent encounter: Secondary | ICD-10-CM | POA: Diagnosis not present

## 2022-07-23 DIAGNOSIS — R69 Illness, unspecified: Secondary | ICD-10-CM | POA: Diagnosis not present

## 2022-07-23 DIAGNOSIS — E876 Hypokalemia: Secondary | ICD-10-CM | POA: Diagnosis not present

## 2022-07-24 DIAGNOSIS — R69 Illness, unspecified: Secondary | ICD-10-CM | POA: Diagnosis not present

## 2022-07-25 DIAGNOSIS — R69 Illness, unspecified: Secondary | ICD-10-CM | POA: Diagnosis not present

## 2022-07-26 ENCOUNTER — Ambulatory Visit (INDEPENDENT_AMBULATORY_CARE_PROVIDER_SITE_OTHER): Payer: Medicare HMO

## 2022-07-26 ENCOUNTER — Encounter: Payer: Self-pay | Admitting: Orthopedic Surgery

## 2022-07-26 ENCOUNTER — Ambulatory Visit (INDEPENDENT_AMBULATORY_CARE_PROVIDER_SITE_OTHER): Payer: Medicare HMO | Admitting: Orthopedic Surgery

## 2022-07-26 VITALS — Ht 66.0 in | Wt 179.0 lb

## 2022-07-26 DIAGNOSIS — I480 Paroxysmal atrial fibrillation: Secondary | ICD-10-CM | POA: Diagnosis not present

## 2022-07-26 DIAGNOSIS — S62101D Fracture of unspecified carpal bone, right wrist, subsequent encounter for fracture with routine healing: Secondary | ICD-10-CM

## 2022-07-26 DIAGNOSIS — E876 Hypokalemia: Secondary | ICD-10-CM | POA: Diagnosis not present

## 2022-07-26 DIAGNOSIS — R001 Bradycardia, unspecified: Secondary | ICD-10-CM | POA: Diagnosis not present

## 2022-07-26 DIAGNOSIS — I10 Essential (primary) hypertension: Secondary | ICD-10-CM | POA: Diagnosis not present

## 2022-07-26 DIAGNOSIS — R69 Illness, unspecified: Secondary | ICD-10-CM | POA: Diagnosis not present

## 2022-07-26 DIAGNOSIS — W19XXXD Unspecified fall, subsequent encounter: Secondary | ICD-10-CM | POA: Diagnosis not present

## 2022-07-26 DIAGNOSIS — Z7901 Long term (current) use of anticoagulants: Secondary | ICD-10-CM | POA: Diagnosis not present

## 2022-07-26 DIAGNOSIS — D62 Acute posthemorrhagic anemia: Secondary | ICD-10-CM | POA: Diagnosis not present

## 2022-07-26 NOTE — Progress Notes (Signed)
FOLLOW UP   Encounter Diagnosis  Name Primary?   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  Her skin under the cast is intact  Her x-ray shows  The plan is for her to have a short arm cast for 4 weeks and then repeat the x-ray  [id number]

## 2022-07-27 DIAGNOSIS — D62 Acute posthemorrhagic anemia: Secondary | ICD-10-CM | POA: Diagnosis not present

## 2022-07-27 DIAGNOSIS — S62101D Fracture of unspecified carpal bone, right wrist, subsequent encounter for fracture with routine healing: Secondary | ICD-10-CM | POA: Diagnosis not present

## 2022-07-27 DIAGNOSIS — I10 Essential (primary) hypertension: Secondary | ICD-10-CM | POA: Diagnosis not present

## 2022-07-27 DIAGNOSIS — R69 Illness, unspecified: Secondary | ICD-10-CM | POA: Diagnosis not present

## 2022-07-27 DIAGNOSIS — Z7901 Long term (current) use of anticoagulants: Secondary | ICD-10-CM | POA: Diagnosis not present

## 2022-07-27 DIAGNOSIS — R001 Bradycardia, unspecified: Secondary | ICD-10-CM | POA: Diagnosis not present

## 2022-07-27 DIAGNOSIS — I480 Paroxysmal atrial fibrillation: Secondary | ICD-10-CM | POA: Diagnosis not present

## 2022-07-27 DIAGNOSIS — W19XXXD Unspecified fall, subsequent encounter: Secondary | ICD-10-CM | POA: Diagnosis not present

## 2022-07-27 DIAGNOSIS — E876 Hypokalemia: Secondary | ICD-10-CM | POA: Diagnosis not present

## 2022-07-28 DIAGNOSIS — R69 Illness, unspecified: Secondary | ICD-10-CM | POA: Diagnosis not present

## 2022-07-29 DIAGNOSIS — R69 Illness, unspecified: Secondary | ICD-10-CM | POA: Diagnosis not present

## 2022-07-30 DIAGNOSIS — I10 Essential (primary) hypertension: Secondary | ICD-10-CM | POA: Diagnosis not present

## 2022-07-30 DIAGNOSIS — E876 Hypokalemia: Secondary | ICD-10-CM | POA: Diagnosis not present

## 2022-07-30 DIAGNOSIS — R001 Bradycardia, unspecified: Secondary | ICD-10-CM | POA: Diagnosis not present

## 2022-07-30 DIAGNOSIS — R69 Illness, unspecified: Secondary | ICD-10-CM | POA: Diagnosis not present

## 2022-07-30 DIAGNOSIS — W19XXXD Unspecified fall, subsequent encounter: Secondary | ICD-10-CM | POA: Diagnosis not present

## 2022-07-30 DIAGNOSIS — D62 Acute posthemorrhagic anemia: Secondary | ICD-10-CM | POA: Diagnosis not present

## 2022-07-30 DIAGNOSIS — Z7901 Long term (current) use of anticoagulants: Secondary | ICD-10-CM | POA: Diagnosis not present

## 2022-07-30 DIAGNOSIS — S62101D Fracture of unspecified carpal bone, right wrist, subsequent encounter for fracture with routine healing: Secondary | ICD-10-CM | POA: Diagnosis not present

## 2022-07-30 DIAGNOSIS — I480 Paroxysmal atrial fibrillation: Secondary | ICD-10-CM | POA: Diagnosis not present

## 2022-07-31 DIAGNOSIS — E876 Hypokalemia: Secondary | ICD-10-CM | POA: Diagnosis not present

## 2022-07-31 DIAGNOSIS — I10 Essential (primary) hypertension: Secondary | ICD-10-CM | POA: Diagnosis not present

## 2022-07-31 DIAGNOSIS — R001 Bradycardia, unspecified: Secondary | ICD-10-CM | POA: Diagnosis not present

## 2022-07-31 DIAGNOSIS — W19XXXD Unspecified fall, subsequent encounter: Secondary | ICD-10-CM | POA: Diagnosis not present

## 2022-07-31 DIAGNOSIS — R69 Illness, unspecified: Secondary | ICD-10-CM | POA: Diagnosis not present

## 2022-07-31 DIAGNOSIS — D62 Acute posthemorrhagic anemia: Secondary | ICD-10-CM | POA: Diagnosis not present

## 2022-07-31 DIAGNOSIS — Z7901 Long term (current) use of anticoagulants: Secondary | ICD-10-CM | POA: Diagnosis not present

## 2022-07-31 DIAGNOSIS — I480 Paroxysmal atrial fibrillation: Secondary | ICD-10-CM | POA: Diagnosis not present

## 2022-07-31 DIAGNOSIS — S62101D Fracture of unspecified carpal bone, right wrist, subsequent encounter for fracture with routine healing: Secondary | ICD-10-CM | POA: Diagnosis not present

## 2022-08-01 DIAGNOSIS — R69 Illness, unspecified: Secondary | ICD-10-CM | POA: Diagnosis not present

## 2022-08-02 DIAGNOSIS — R69 Illness, unspecified: Secondary | ICD-10-CM | POA: Diagnosis not present

## 2022-08-03 DIAGNOSIS — R69 Illness, unspecified: Secondary | ICD-10-CM | POA: Diagnosis not present

## 2022-08-04 DIAGNOSIS — R69 Illness, unspecified: Secondary | ICD-10-CM | POA: Diagnosis not present

## 2022-08-05 DIAGNOSIS — R69 Illness, unspecified: Secondary | ICD-10-CM | POA: Diagnosis not present

## 2022-08-06 DIAGNOSIS — R69 Illness, unspecified: Secondary | ICD-10-CM | POA: Diagnosis not present

## 2022-08-07 DIAGNOSIS — E559 Vitamin D deficiency, unspecified: Secondary | ICD-10-CM | POA: Diagnosis not present

## 2022-08-07 DIAGNOSIS — R69 Illness, unspecified: Secondary | ICD-10-CM | POA: Diagnosis not present

## 2022-08-07 DIAGNOSIS — R4189 Other symptoms and signs involving cognitive functions and awareness: Secondary | ICD-10-CM | POA: Diagnosis not present

## 2022-08-07 DIAGNOSIS — J302 Other seasonal allergic rhinitis: Secondary | ICD-10-CM | POA: Diagnosis not present

## 2022-08-07 DIAGNOSIS — I739 Peripheral vascular disease, unspecified: Secondary | ICD-10-CM | POA: Diagnosis not present

## 2022-08-07 DIAGNOSIS — H9190 Unspecified hearing loss, unspecified ear: Secondary | ICD-10-CM | POA: Diagnosis not present

## 2022-08-07 DIAGNOSIS — I10 Essential (primary) hypertension: Secondary | ICD-10-CM | POA: Diagnosis not present

## 2022-08-07 DIAGNOSIS — G629 Polyneuropathy, unspecified: Secondary | ICD-10-CM | POA: Diagnosis not present

## 2022-08-08 DIAGNOSIS — I10 Essential (primary) hypertension: Secondary | ICD-10-CM | POA: Diagnosis not present

## 2022-08-08 DIAGNOSIS — I480 Paroxysmal atrial fibrillation: Secondary | ICD-10-CM | POA: Diagnosis not present

## 2022-08-08 DIAGNOSIS — R001 Bradycardia, unspecified: Secondary | ICD-10-CM | POA: Diagnosis not present

## 2022-08-08 DIAGNOSIS — W19XXXD Unspecified fall, subsequent encounter: Secondary | ICD-10-CM | POA: Diagnosis not present

## 2022-08-08 DIAGNOSIS — S62101D Fracture of unspecified carpal bone, right wrist, subsequent encounter for fracture with routine healing: Secondary | ICD-10-CM | POA: Diagnosis not present

## 2022-08-08 DIAGNOSIS — D62 Acute posthemorrhagic anemia: Secondary | ICD-10-CM | POA: Diagnosis not present

## 2022-08-08 DIAGNOSIS — E876 Hypokalemia: Secondary | ICD-10-CM | POA: Diagnosis not present

## 2022-08-08 DIAGNOSIS — R69 Illness, unspecified: Secondary | ICD-10-CM | POA: Diagnosis not present

## 2022-08-08 DIAGNOSIS — Z7901 Long term (current) use of anticoagulants: Secondary | ICD-10-CM | POA: Diagnosis not present

## 2022-08-09 ENCOUNTER — Ambulatory Visit: Payer: Medicare HMO | Attending: Cardiology | Admitting: Cardiology

## 2022-08-09 ENCOUNTER — Encounter: Payer: Self-pay | Admitting: Cardiology

## 2022-08-09 VITALS — BP 118/74 | HR 44 | Ht 66.0 in | Wt 146.0 lb

## 2022-08-09 DIAGNOSIS — D62 Acute posthemorrhagic anemia: Secondary | ICD-10-CM | POA: Diagnosis not present

## 2022-08-09 DIAGNOSIS — R69 Illness, unspecified: Secondary | ICD-10-CM | POA: Diagnosis not present

## 2022-08-09 DIAGNOSIS — Z7901 Long term (current) use of anticoagulants: Secondary | ICD-10-CM | POA: Diagnosis not present

## 2022-08-09 DIAGNOSIS — D6869 Other thrombophilia: Secondary | ICD-10-CM

## 2022-08-09 DIAGNOSIS — I480 Paroxysmal atrial fibrillation: Secondary | ICD-10-CM | POA: Diagnosis not present

## 2022-08-09 DIAGNOSIS — R600 Localized edema: Secondary | ICD-10-CM | POA: Diagnosis not present

## 2022-08-09 DIAGNOSIS — R001 Bradycardia, unspecified: Secondary | ICD-10-CM | POA: Diagnosis not present

## 2022-08-09 DIAGNOSIS — W19XXXD Unspecified fall, subsequent encounter: Secondary | ICD-10-CM | POA: Diagnosis not present

## 2022-08-09 DIAGNOSIS — I10 Essential (primary) hypertension: Secondary | ICD-10-CM | POA: Diagnosis not present

## 2022-08-09 DIAGNOSIS — E876 Hypokalemia: Secondary | ICD-10-CM | POA: Diagnosis not present

## 2022-08-09 DIAGNOSIS — F03918 Unspecified dementia, unspecified severity, with other behavioral disturbance: Secondary | ICD-10-CM | POA: Diagnosis not present

## 2022-08-09 DIAGNOSIS — F419 Anxiety disorder, unspecified: Secondary | ICD-10-CM | POA: Diagnosis not present

## 2022-08-09 DIAGNOSIS — F5101 Primary insomnia: Secondary | ICD-10-CM | POA: Diagnosis not present

## 2022-08-09 DIAGNOSIS — S62101D Fracture of unspecified carpal bone, right wrist, subsequent encounter for fracture with routine healing: Secondary | ICD-10-CM | POA: Diagnosis not present

## 2022-08-09 DIAGNOSIS — R443 Hallucinations, unspecified: Secondary | ICD-10-CM | POA: Diagnosis not present

## 2022-08-09 NOTE — Patient Instructions (Signed)
Medication Instructions:  Continue all current medications.   Labwork: none  Testing/Procedures: none  Follow-Up: 6 months   Any Other Special Instructions Will Be Listed Below (If Applicable).   If you need a refill on your cardiac medications before your next appointment, please call your pharmacy.  

## 2022-08-09 NOTE — Progress Notes (Signed)
Clinical Summary Ms. Kirchner is a 88 y.o.female seen as a new patient for the following medical problems.       1.LE edema - ongonig about 1.5 month - occasonal SOB, wheezing. Occasional SOb with lying flat. ` - BNP at recent ER visti 214.     03/2022 echo: LVEF 55-60%, no WMAs, indet diastolic fxn, elevated LA pressure, mild MR  - no recent issues    2.Dementia       3. PAF - new diagnosis this clinic visit - difficult historian to assess palpitaitons.  - lopressor stopped during 07/02/22 admission due to low HRs.   - no palpitaitons, no dizziness though very difficult historian given dementia    4. Fall/wrist fracture - admit 06/2022, had wrist surgery - unclear history of fall, was not witnessed and patient dementia.  Past Medical History:  Diagnosis Date   Arthritis    GERD (gastroesophageal reflux disease)    Hypertension    Prolactinoma (HCC)    Stroke (HCC)      No Known Allergies   Current Outpatient Medications  Medication Sig Dispense Refill   acetaminophen (TYLENOL) 500 MG tablet Take 1 tablet by mouth 3 (three) times daily as needed.     apixaban (ELIQUIS) 5 MG TABS tablet Take 1 tablet (5 mg total) by mouth 2 (two) times daily. 60 tablet 6   atorvastatin (LIPITOR) 10 MG tablet Take 1 tablet by mouth at bedtime.     Cholecalciferol (VITAMIN D3) 250 MCG (10000 UT) TABS Take 1 tablet by mouth daily.     docusate sodium (COLACE) 100 MG capsule Take 1 capsule (100 mg total) by mouth 2 (two) times daily. 60 capsule 2   HYDROcodone-acetaminophen (NORCO/VICODIN) 5-325 MG tablet Take 1 tablet by mouth 2 (two) times daily as needed for severe pain. 10 tablet 0   PARoxetine (PAXIL) 10 MG tablet Take 10 mg by mouth daily.     polyethylene glycol (MIRALAX / GLYCOLAX) 17 g packet Take 17 g by mouth daily as needed for mild constipation. 14 each 0   polyethylene glycol (MIRALAX) 17 g packet Take 17 g by mouth daily. 14 each 0   No current  facility-administered medications for this visit.     Past Surgical History:  Procedure Laterality Date   ABDOMINAL HYSTERECTOMY  yrs ago   CATARACT EXTRACTION W/ INTRAOCULAR LENS  IMPLANT, BILATERAL  2008   both eyes done   ORIF WRIST FRACTURE Right 06/29/2022   Procedure: OPEN REDUCTION INTERNAL FIXATION (ORIF) WRIST FRACTURE;  Surgeon: Carole Civil, MD;  Location: [facility];  Service: Orthopedics;  Laterality: Right;   RECTOCELE REPAIR  yrs ago   right hip arthroplasty  2003   TONSILLECTOMY  age 53   TOTAL HIP ARTHROPLASTY  09/18/2011   Procedure: TOTAL HIP ARTHROPLASTY ANTERIOR APPROACH;  Surgeon: Mauri Pole;  Location: [facility];  Service: Orthopedics;  Laterality: Left;     No Known Allergies    No family history on file.   Social History Ms. Rickles reports that she quit smoking about 34 years ago. Her smoking use included cigarettes. She has a 7.50 pack-year smoking history. She has never used smokeless tobacco. Ms. Hettinger reports no history of alcohol use.   Review of Systems CONSTITUTIONAL: No weight loss, fever, chills, weakness or fatigue.  HEENT: Eyes: No visual loss, blurred vision, double vision or yellow sclerae.No hearing loss, sneezing, congestion, runny nose or sore throat.  SKIN: No rash or  itching.  CARDIOVASCULAR: per hpi RESPIRATORY: No shortness of breath, cough or sputum.  GASTROINTESTINAL: No anorexia, nausea, vomiting or diarrhea. No abdominal pain or blood.  GENITOURINARY: No burning on urination, no polyuria NEUROLOGICAL: No headache, dizziness, syncope, paralysis, ataxia, numbness or tingling in the extremities. No change in bowel or bladder control.  MUSCULOSKELETAL: No muscle, back pain, joint pain or stiffness.  LYMPHATICS: No enlarged nodes. No history of splenectomy.  PSYCHIATRIC: No history of depression or anxiety.  ENDOCRINOLOGIC: No reports of sweating, cold or heat intolerance. No polyuria or polydipsia.  Marland Kitchen   Physical  Examination Today's Vitals   08/09/22 1459  BP: 118/74  Pulse: (!) 44  SpO2: 100%  Weight: 146 lb (66.2 kg)  Height: '5\' 6"'$  (1.676 m)   Body mass index is 23.57 kg/m.  Gen: resting comfortably, no acute distress HEENT: no scleral icterus, pupils equal round and reactive, no palptable cervical adenopathy,  CV: RRR, n m/r/g, no jvd Resp: Clear to auscultation bilaterally GI: abdomen is soft, non-tender, non-distended, normal bowel sounds, no hepatosplenomegaly MSK: extremities are warm, no edema.  Skin: warm, no rash Neuro:  no focal deficits Psych: appropriate affect   Diagnostic Studies  03/2022 echo 1. Left ventricular ejection fraction, by estimation, is 55 to 60%. The  left ventricle has normal function. The left ventricle has no regional  wall motion abnormalities. Left ventricular diastolic parameters are  indeterminate. Elevated left atrial  pressure.   2. Right ventricular systolic function is normal. The right ventricular  size is normal. Tricuspid regurgitation signal is inadequate for assessing  PA pressure.   3. The mitral valve is abnormal. Mild mitral valve regurgitation. No  evidence of mitral stenosis.   4. The aortic valve is tricuspid. Aortic valve regurgitation is not  visualized. No aortic stenosis is present.   5. The inferior vena cava is normal in size with greater than 50%  respiratory variability, suggesting right atrial pressure of 3 mmHg.    Assessment and Plan   1.Leg edema Resolved, continue to monitor - echo with LVEF 62-70%, indet diastolic fxn but evidence of elevated LA pressure   2. PAF/acquired thrombophilia - self rate controlled, has not required av nodal agents - on eliquis for stroke prevention, tolerating without issues. Continue current therapy   3. Bradycardia - HR 40s by dynamap however EKG show SR 74 with isolated PVC, perhaps dynamap inaccurate due to PVCs. Did have some brady in hospital, avoiding av nodal agents. Has not  been symptomatic.    F/u 6 months     Arnoldo Lenis, M.D.

## 2022-08-10 DIAGNOSIS — R69 Illness, unspecified: Secondary | ICD-10-CM | POA: Diagnosis not present

## 2022-08-10 NOTE — Addendum Note (Signed)
Addended by: Laurine Blazer on: 08/10/2022 10:59 AM   Modules accepted: Orders

## 2022-08-11 DIAGNOSIS — R69 Illness, unspecified: Secondary | ICD-10-CM | POA: Diagnosis not present

## 2022-08-12 DIAGNOSIS — R69 Illness, unspecified: Secondary | ICD-10-CM | POA: Diagnosis not present

## 2022-08-13 DIAGNOSIS — R69 Illness, unspecified: Secondary | ICD-10-CM | POA: Diagnosis not present

## 2022-08-14 DIAGNOSIS — R69 Illness, unspecified: Secondary | ICD-10-CM | POA: Diagnosis not present

## 2022-08-15 DIAGNOSIS — R69 Illness, unspecified: Secondary | ICD-10-CM | POA: Diagnosis not present

## 2022-08-15 DIAGNOSIS — N8110 Cystocele, unspecified: Secondary | ICD-10-CM | POA: Diagnosis not present

## 2022-08-16 DIAGNOSIS — R69 Illness, unspecified: Secondary | ICD-10-CM | POA: Diagnosis not present

## 2022-08-17 DIAGNOSIS — R69 Illness, unspecified: Secondary | ICD-10-CM | POA: Diagnosis not present

## 2022-08-18 DIAGNOSIS — R69 Illness, unspecified: Secondary | ICD-10-CM | POA: Diagnosis not present

## 2022-08-19 DIAGNOSIS — R69 Illness, unspecified: Secondary | ICD-10-CM | POA: Diagnosis not present

## 2022-08-20 DIAGNOSIS — R69 Illness, unspecified: Secondary | ICD-10-CM | POA: Diagnosis not present

## 2022-08-21 DIAGNOSIS — R4182 Altered mental status, unspecified: Secondary | ICD-10-CM | POA: Diagnosis not present

## 2022-08-21 DIAGNOSIS — R69 Illness, unspecified: Secondary | ICD-10-CM | POA: Diagnosis not present

## 2022-08-22 DIAGNOSIS — R69 Illness, unspecified: Secondary | ICD-10-CM | POA: Diagnosis not present

## 2022-08-23 DIAGNOSIS — R69 Illness, unspecified: Secondary | ICD-10-CM | POA: Diagnosis not present

## 2022-08-24 DIAGNOSIS — R69 Illness, unspecified: Secondary | ICD-10-CM | POA: Diagnosis not present

## 2022-08-25 DIAGNOSIS — R69 Illness, unspecified: Secondary | ICD-10-CM | POA: Diagnosis not present

## 2022-08-26 DIAGNOSIS — R69 Illness, unspecified: Secondary | ICD-10-CM | POA: Diagnosis not present

## 2022-08-27 ENCOUNTER — Ambulatory Visit (INDEPENDENT_AMBULATORY_CARE_PROVIDER_SITE_OTHER): Payer: Medicare HMO | Admitting: Orthopedic Surgery

## 2022-08-27 ENCOUNTER — Encounter: Payer: Self-pay | Admitting: Orthopedic Surgery

## 2022-08-27 ENCOUNTER — Ambulatory Visit (INDEPENDENT_AMBULATORY_CARE_PROVIDER_SITE_OTHER): Payer: Medicare HMO

## 2022-08-27 DIAGNOSIS — S62101D Fracture of unspecified carpal bone, right wrist, subsequent encounter for fracture with routine healing: Secondary | ICD-10-CM | POA: Diagnosis not present

## 2022-08-27 DIAGNOSIS — R69 Illness, unspecified: Secondary | ICD-10-CM | POA: Diagnosis not present

## 2022-08-27 NOTE — Progress Notes (Signed)
Chief Complaint  Patient presents with   s/p ORIF     RT wrist DOS 06/29/22    88 year old female is 81 to 43 days post ORIF of the radius and ulna.  The fracture fixation is holding up nicely the wrist and hand are very stiff the x-rays look good recommend a brace OT follow-up in 1 month with x-rays

## 2022-08-28 DIAGNOSIS — R69 Illness, unspecified: Secondary | ICD-10-CM | POA: Diagnosis not present

## 2022-08-29 DIAGNOSIS — R69 Illness, unspecified: Secondary | ICD-10-CM | POA: Diagnosis not present

## 2022-08-30 DIAGNOSIS — Z79899 Other long term (current) drug therapy: Secondary | ICD-10-CM | POA: Diagnosis not present

## 2022-08-30 DIAGNOSIS — R69 Illness, unspecified: Secondary | ICD-10-CM | POA: Diagnosis not present

## 2022-08-30 DIAGNOSIS — D518 Other vitamin B12 deficiency anemias: Secondary | ICD-10-CM | POA: Diagnosis not present

## 2022-08-30 DIAGNOSIS — E038 Other specified hypothyroidism: Secondary | ICD-10-CM | POA: Diagnosis not present

## 2022-08-30 DIAGNOSIS — E782 Mixed hyperlipidemia: Secondary | ICD-10-CM | POA: Diagnosis not present

## 2022-08-31 DIAGNOSIS — R69 Illness, unspecified: Secondary | ICD-10-CM | POA: Diagnosis not present

## 2022-09-01 DIAGNOSIS — R69 Illness, unspecified: Secondary | ICD-10-CM | POA: Diagnosis not present

## 2022-09-02 DIAGNOSIS — R69 Illness, unspecified: Secondary | ICD-10-CM | POA: Diagnosis not present

## 2022-09-03 DIAGNOSIS — R69 Illness, unspecified: Secondary | ICD-10-CM | POA: Diagnosis not present

## 2022-09-04 DIAGNOSIS — R69 Illness, unspecified: Secondary | ICD-10-CM | POA: Diagnosis not present

## 2022-09-05 DIAGNOSIS — D518 Other vitamin B12 deficiency anemias: Secondary | ICD-10-CM | POA: Diagnosis not present

## 2022-09-05 DIAGNOSIS — I10 Essential (primary) hypertension: Secondary | ICD-10-CM | POA: Diagnosis not present

## 2022-09-05 DIAGNOSIS — E119 Type 2 diabetes mellitus without complications: Secondary | ICD-10-CM | POA: Diagnosis not present

## 2022-09-05 DIAGNOSIS — E785 Hyperlipidemia, unspecified: Secondary | ICD-10-CM | POA: Diagnosis not present

## 2022-09-05 DIAGNOSIS — E038 Other specified hypothyroidism: Secondary | ICD-10-CM | POA: Diagnosis not present

## 2022-09-05 DIAGNOSIS — E559 Vitamin D deficiency, unspecified: Secondary | ICD-10-CM | POA: Diagnosis not present

## 2022-09-05 DIAGNOSIS — R69 Illness, unspecified: Secondary | ICD-10-CM | POA: Diagnosis not present

## 2022-09-06 DIAGNOSIS — F419 Anxiety disorder, unspecified: Secondary | ICD-10-CM | POA: Diagnosis not present

## 2022-09-06 DIAGNOSIS — F03918 Unspecified dementia, unspecified severity, with other behavioral disturbance: Secondary | ICD-10-CM | POA: Diagnosis not present

## 2022-09-06 DIAGNOSIS — R69 Illness, unspecified: Secondary | ICD-10-CM | POA: Diagnosis not present

## 2022-09-06 DIAGNOSIS — R443 Hallucinations, unspecified: Secondary | ICD-10-CM | POA: Diagnosis not present

## 2022-09-06 DIAGNOSIS — F5101 Primary insomnia: Secondary | ICD-10-CM | POA: Diagnosis not present

## 2022-09-06 IMAGING — DX DG CHEST 1V
1 series · 1 of 1 positions shown · non-contrast
Comparison: None Available.

CLINICAL DATA: Fall today.

EXAM:
CHEST  1 VIEW

[chest ap]
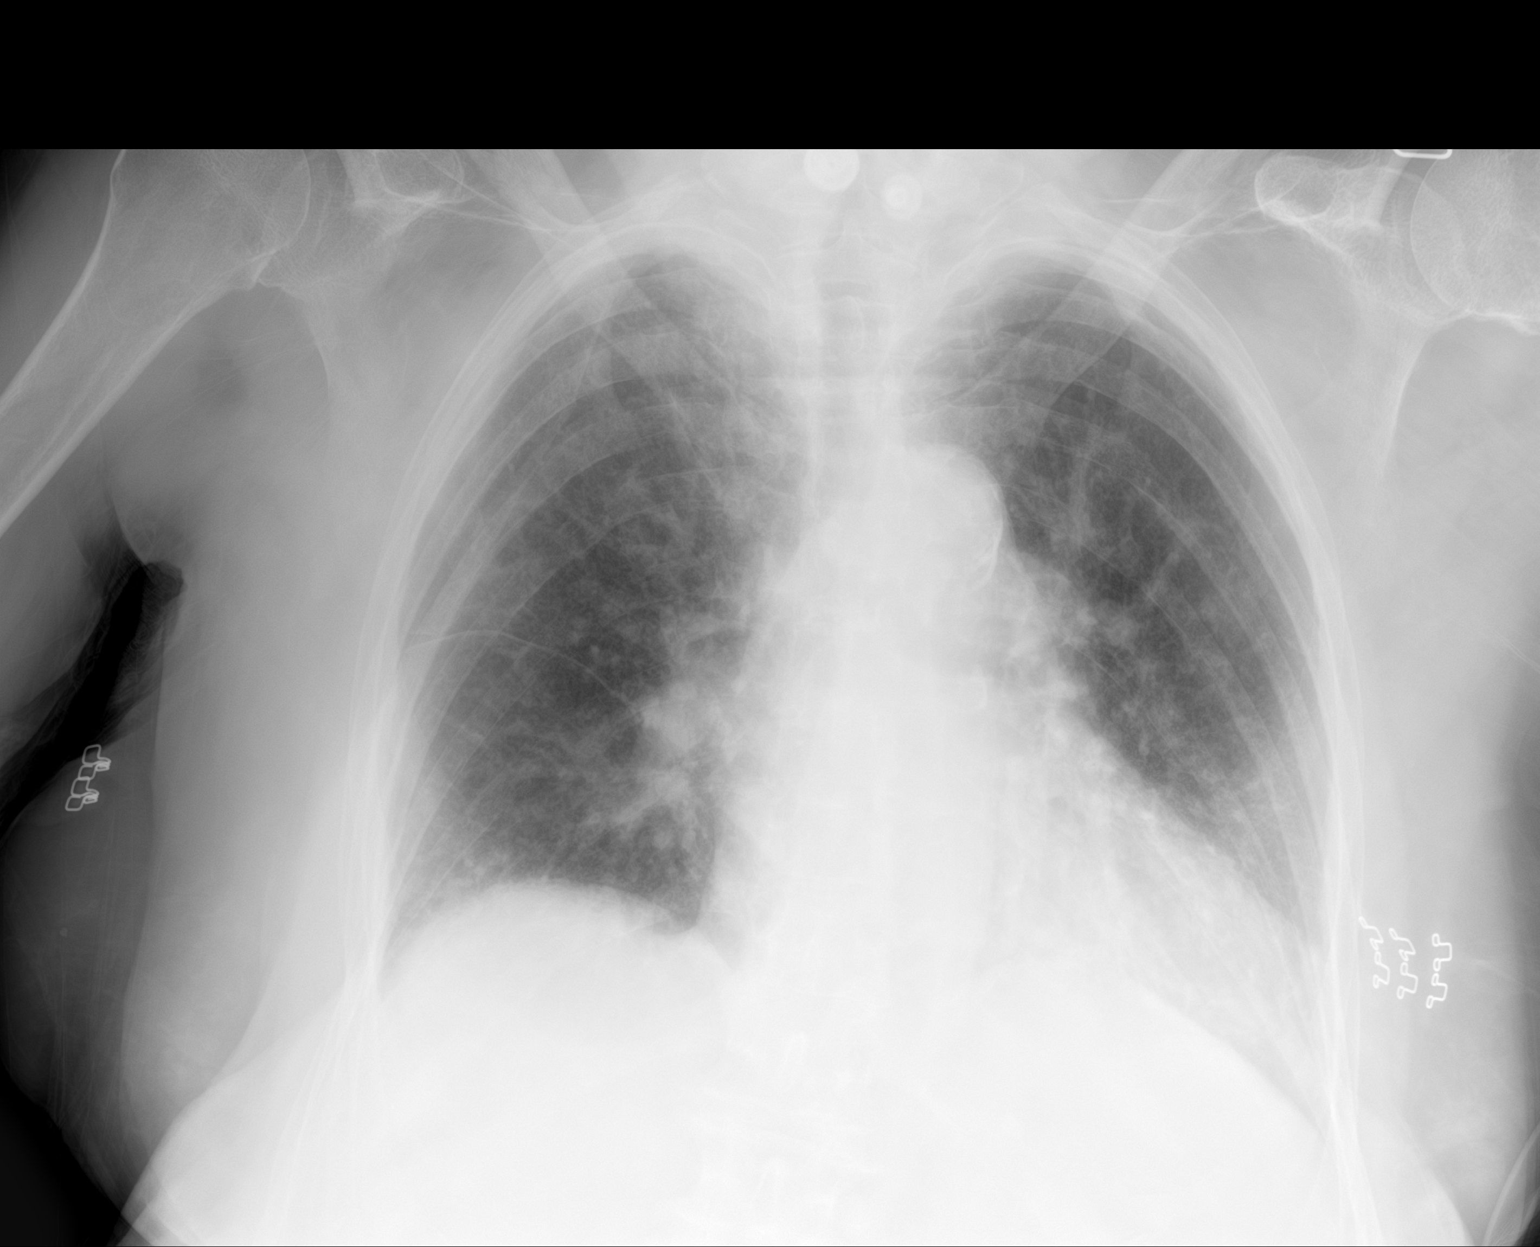

[1 of 1 positions shown; findings below may reference images not displayed]

FINDINGS: Cardiac silhouette and mediastinal contours within normal limits.
Mild-to-moderate calcification within aortic arch. Moderate
bilateral interstitial thickening. No pleural effusion or
pneumothorax. No acute skeletal abnormality.
IMPRESSION: There is moderate bilateral interstitial thickening. This may
represent chronic interstitial scarring. In the acute setting, this
may represent mild-to-moderate interstitial pulmonary edema. No
comparison is available.

## 2022-09-06 IMAGING — CT CT HEAD W/O CM
4 series · 15 of 47 positions shown, 17 images · non-contrast
Comparison: CT cervical spine June 15, 20.

CLINICAL DATA: Polytrauma, blunt; Polytrauma, blunt fall, frontal
head trauma w lac



[Series 3: head w o · axial · 0.43mm/px · z∈[-23,+97]mm · 7 of 32 slices shown, 9 images]
[im 4/32  brain]
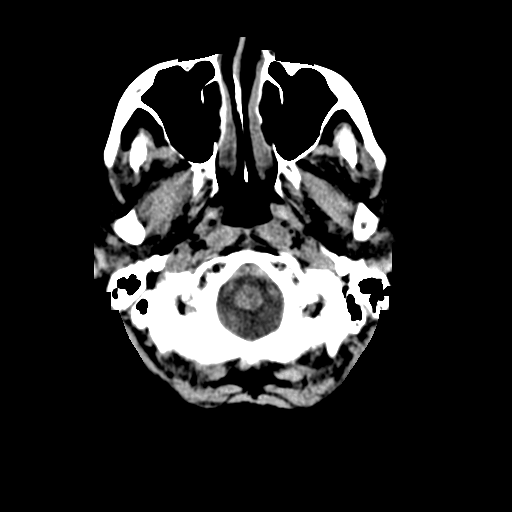
[im 4/32  bone]
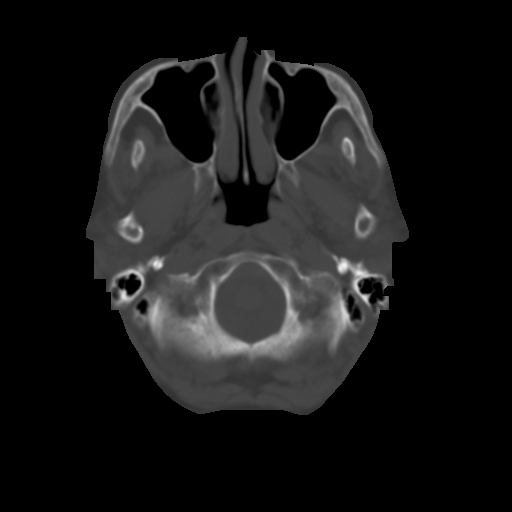
[im 8/32  brain]
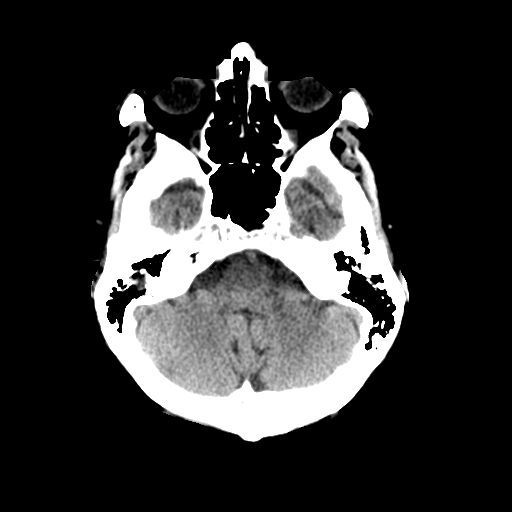
[im 12/32  brain]
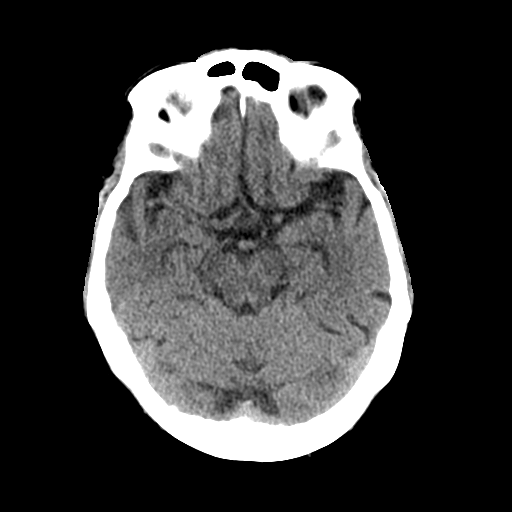
[im 16/32  brain]
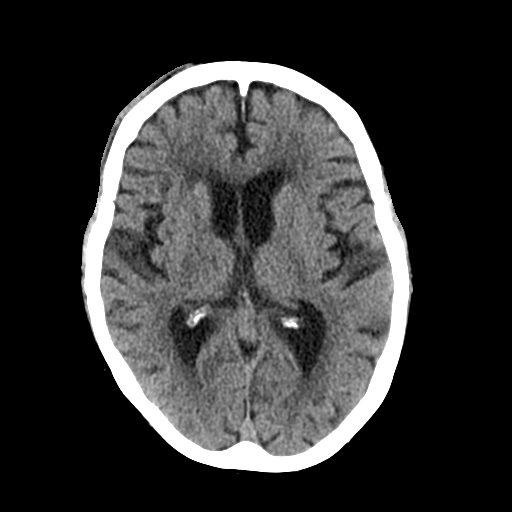
[im 20/32  brain]
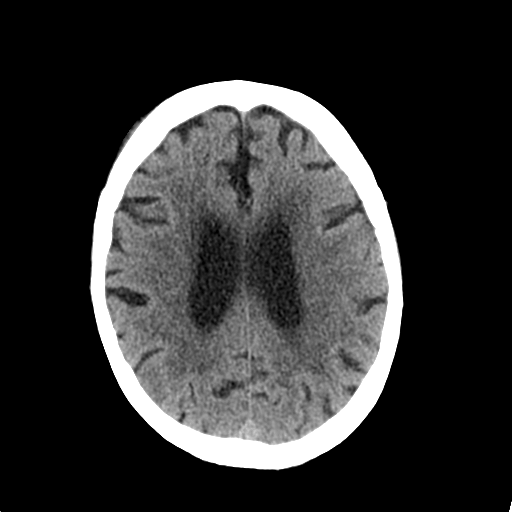
[im 20/32  bone]
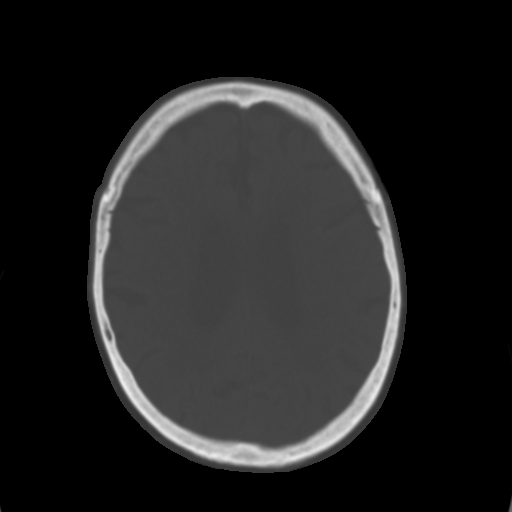
[im 24/32  brain]
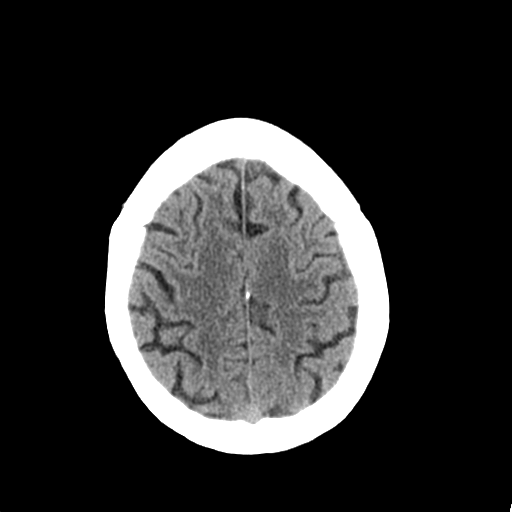
[im 28/32  brain]
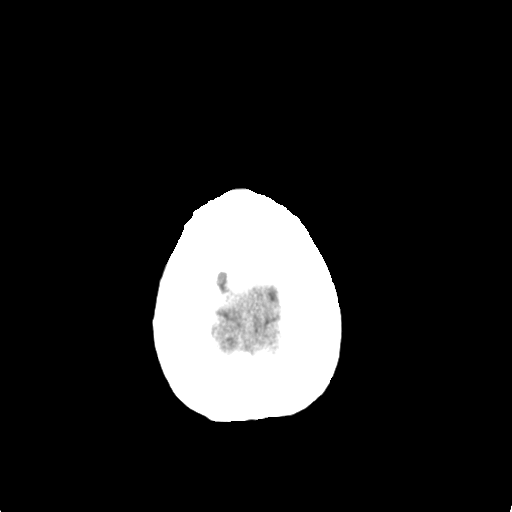

[Series 4: head bone · axial · 0.43mm/px · z∈[-24,-8]mm · 2 of 79 slices shown]
[im 8/79  bone]
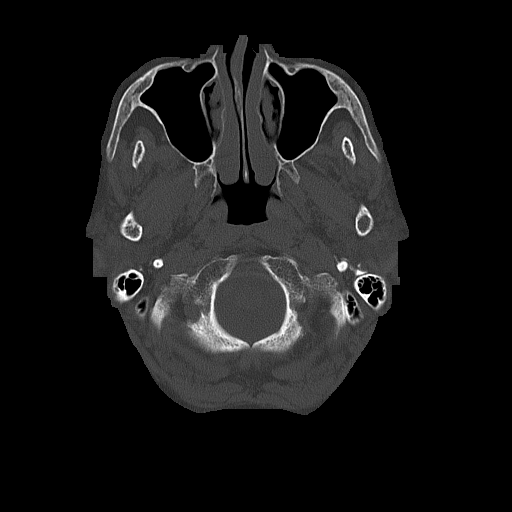
[im 16/79  bone]
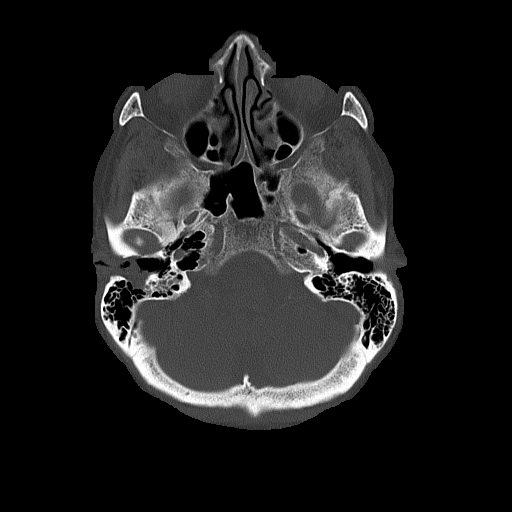

[Series 5: coronal soft · coronal · 0.34mm/px · 3 of 83 slices shown]
[im 28/83  brain]
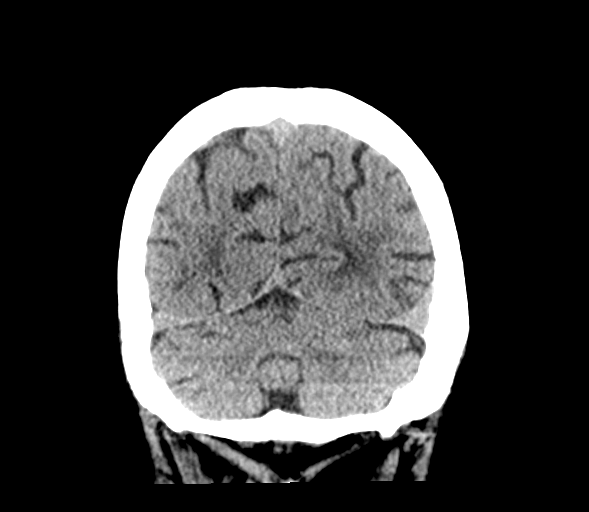
[im 37/83  brain]
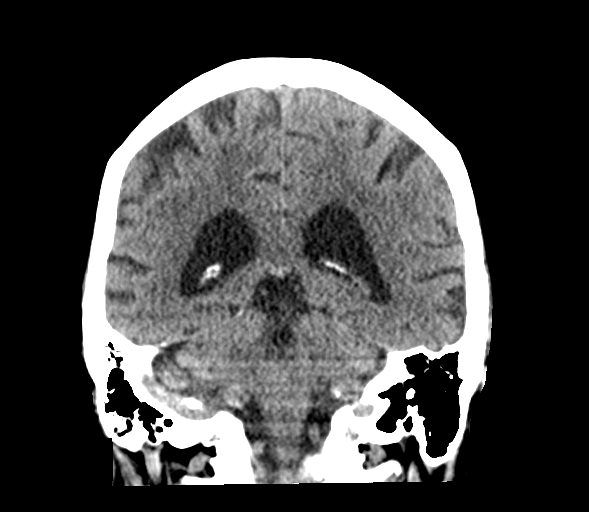
[im 46/83  brain]
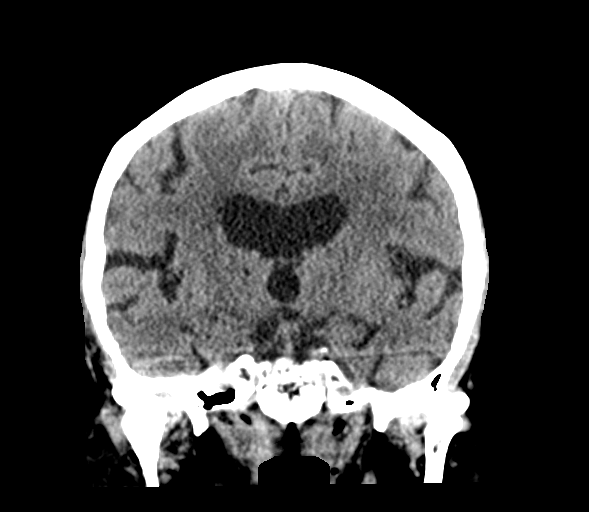

[Series 6: sagittal soft · sagittal · 0.34mm/px · 3 of 61 slices shown]
[im 21/61  brain]
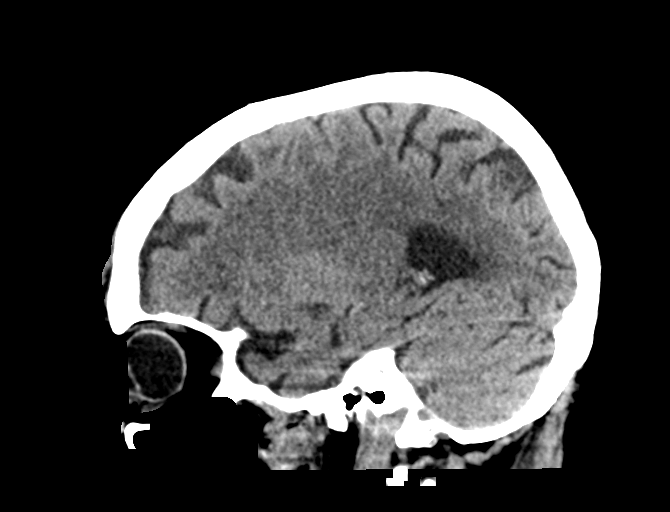
[im 31/61  brain]
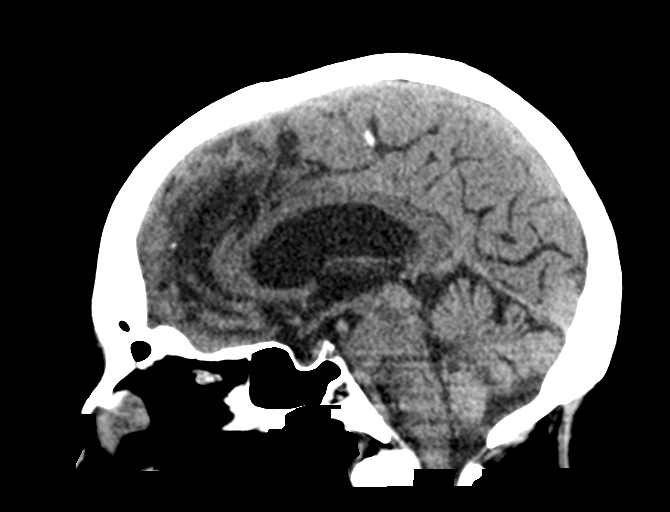
[im 41/61  brain]
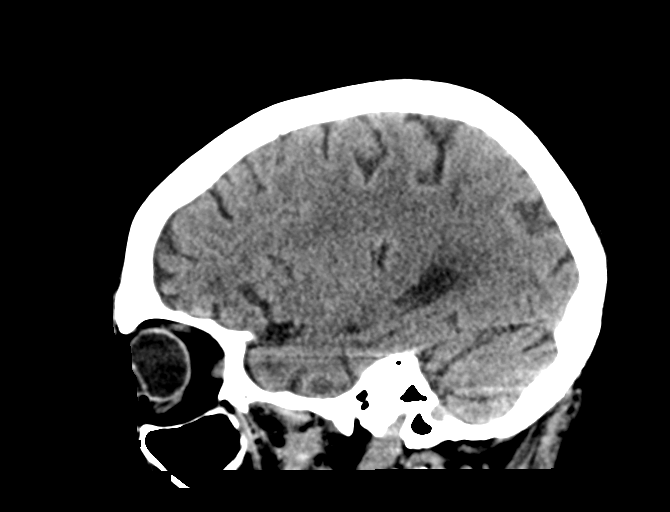

[15 of 47 positions shown; findings below may reference images not displayed]

FINDINGS: CT HEAD FINDINGS

Brain: No evidence of acute infarction, hemorrhage, hydrocephalus,
extra-axial collection or mass lesion/mass effect. Mild for age
chronic microvascular disease and atrophy.

Vascular: No hyperdense vessel identified.

Skull: Right frontal scalp hematoma.  No acute fracture.

Sinuses/Orbits: Visualized sinuses are clear. No acute orbital
findings.

Other: Nodular soft tissue thickening of the right external auditory
canal with nodular soft tissue in the right mastoid antrum.

CT CERVICAL SPINE FINDINGS

Alignment: Mild retrolisthesis of C3 on C4 and mild anterolisthesis
of C6 on C7, similar to prior.

Skull base and vertebrae: Mild height loss of the visualized T2
vertebral body, similar to prior. Otherwise, no vertebral body
height loss. No evidence of acute fracture.

Soft tissues and spinal canal: No prevertebral fluid or swelling. No
visible canal hematoma.

Disc levels: Similar moderate to severe multilevel degenerative disc
disease. Multilevel facet and uncovertebral hypertrophy with varying
degrees of neural foraminal stenosis.

Upper chest: No consolidation the visualized lung apices.
IMPRESSION: CT head:

1. No evidence of acute intracranial abnormality.
2. Right frontal scalp hematoma without calvarial fracture.
3. Nodular soft tissue thickening of the right external auditory
canal with nodular soft tissue in the mastoid antrum. Findings
appear improved relative to maxillofacial CT from June 15, 2020
and could potentially represent malignancy and/or chronic infection.
If not previously referred/evaluated, recommend ENT consultation and
direct inspection.

CT cervical spine:

No evidence of acute fracture or traumatic malalignment.

## 2022-09-06 IMAGING — CT CT CERVICAL SPINE W/O CM
3 series · 13 of 33 positions shown, 16 images · non-contrast
Comparison: CT cervical spine June 15, 20.

CLINICAL DATA: Polytrauma, blunt; Polytrauma, blunt fall, frontal
head trauma w lac



[Series 8: c spine soft · axial · 0.29mm/px · z∈[-160,-44]mm · 5 of 86 slices shown, 7 images]
[im 14/86  soft-tissue]
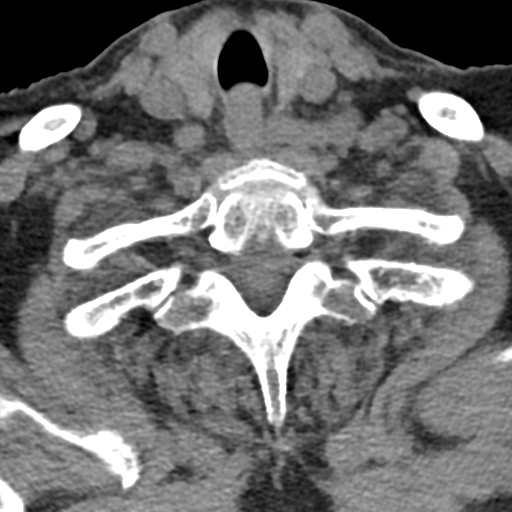
[im 14/86  bone]
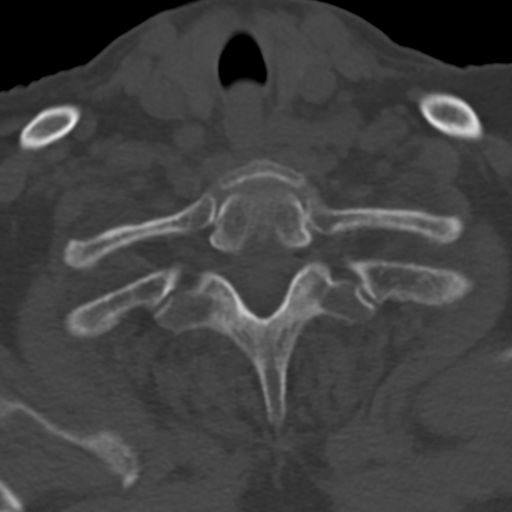
[im 27/86  bone]
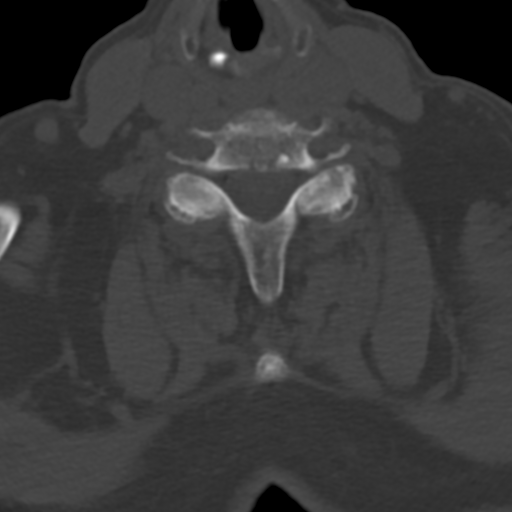
[im 46/86  bone]
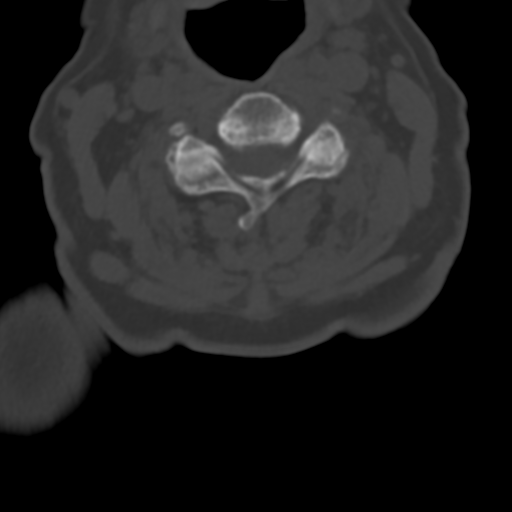
[im 59/86  bone]
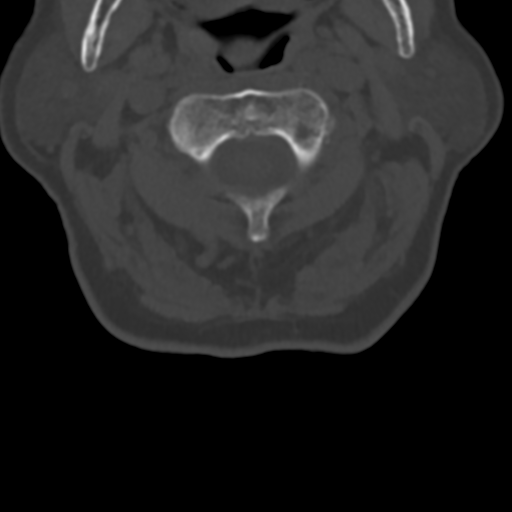
[im 72/86  soft-tissue]
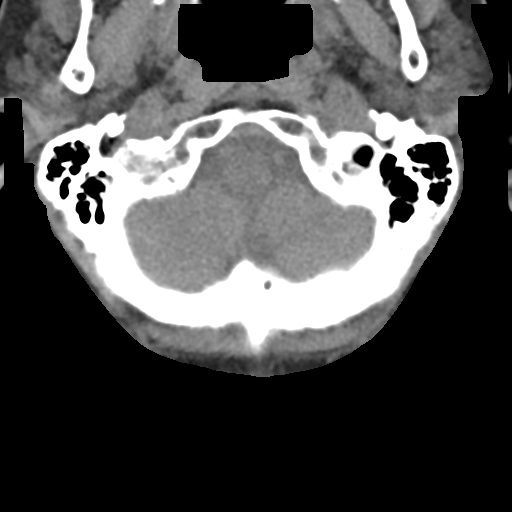
[im 72/86  bone]
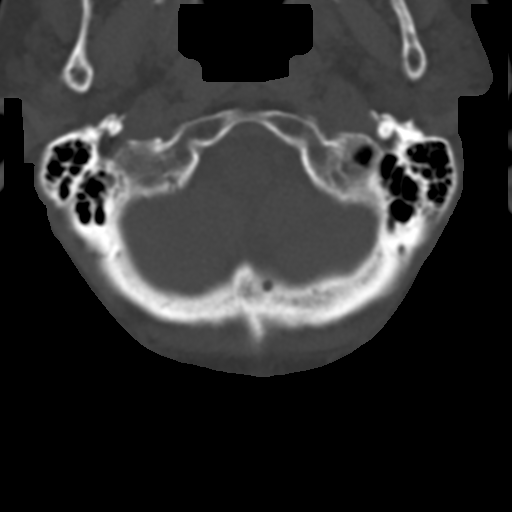

[Series 9: sagittal bone · sagittal · 0.36mm/px · 5 of 61 slices shown, 6 images]
[im 21/61  bone]
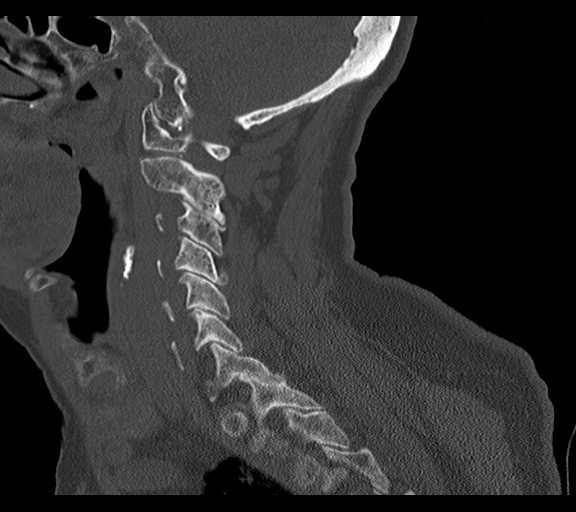
[im 26/61  bone]
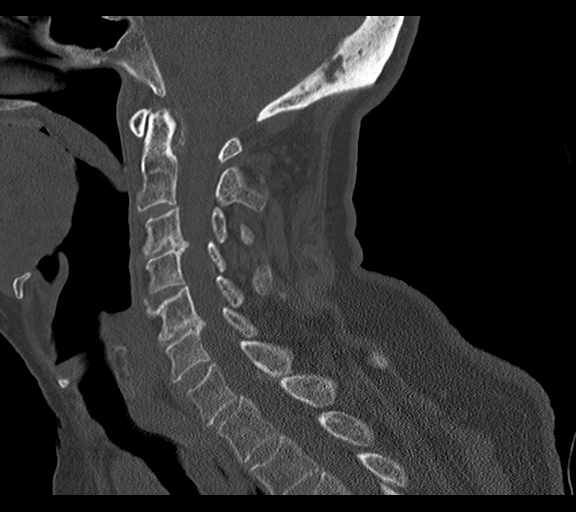
[im 31/61  soft-tissue]
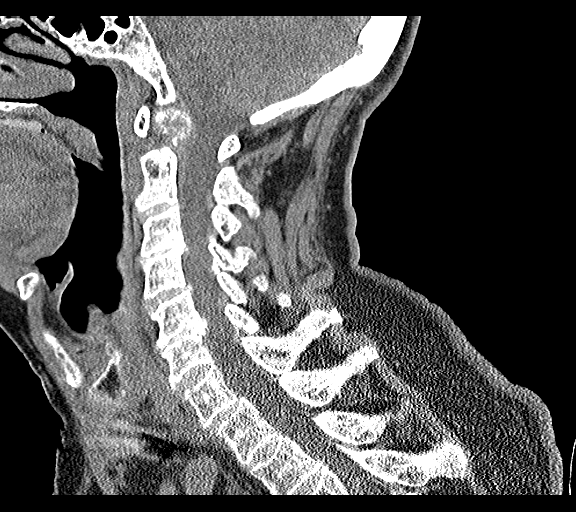
[im 31/61  bone]
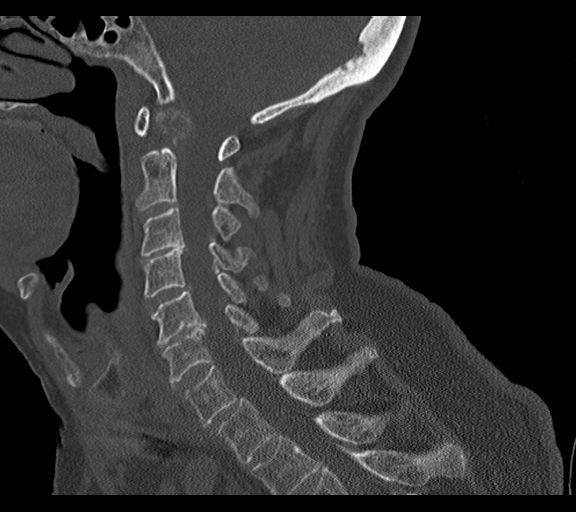
[im 36/61  bone]
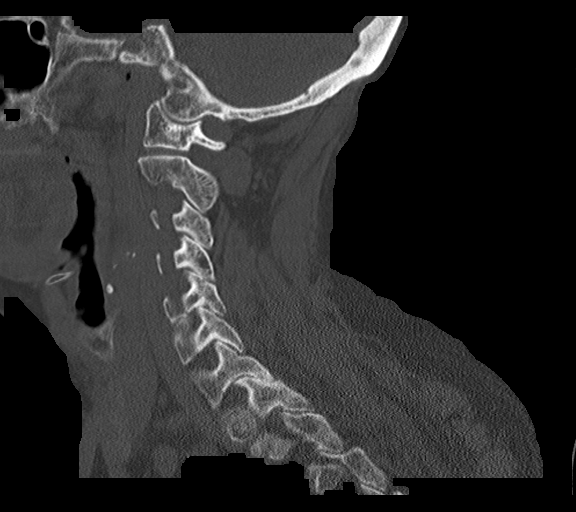
[im 41/61  bone]
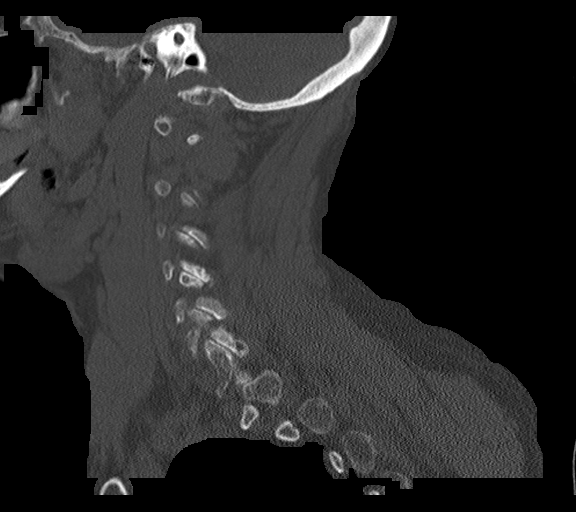

[Series 10: coronal bone · coronal · 0.25mm/px · 3 of 65 slices shown]
[im 13/65  bone]
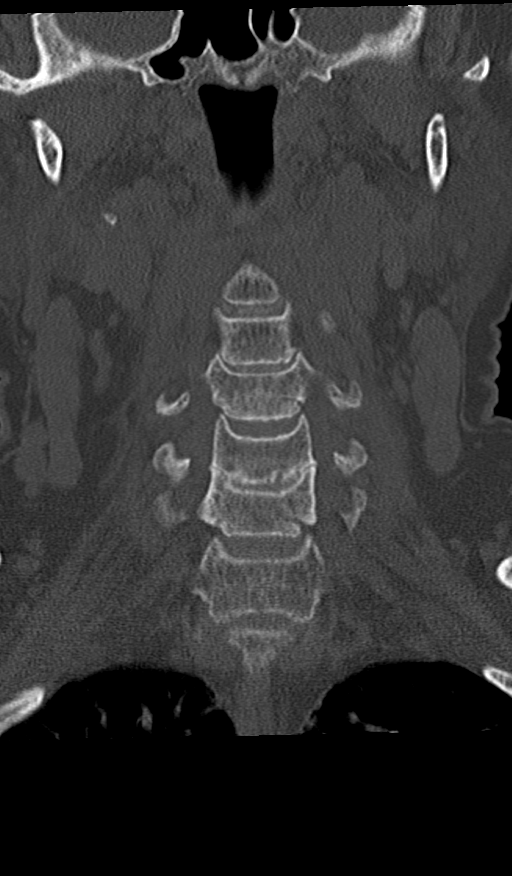
[im 26/65  bone]
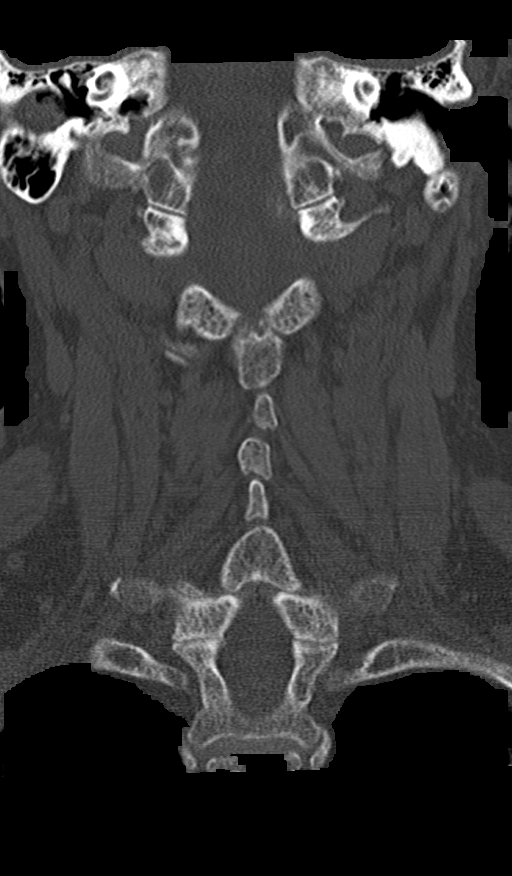
[im 39/65  bone]
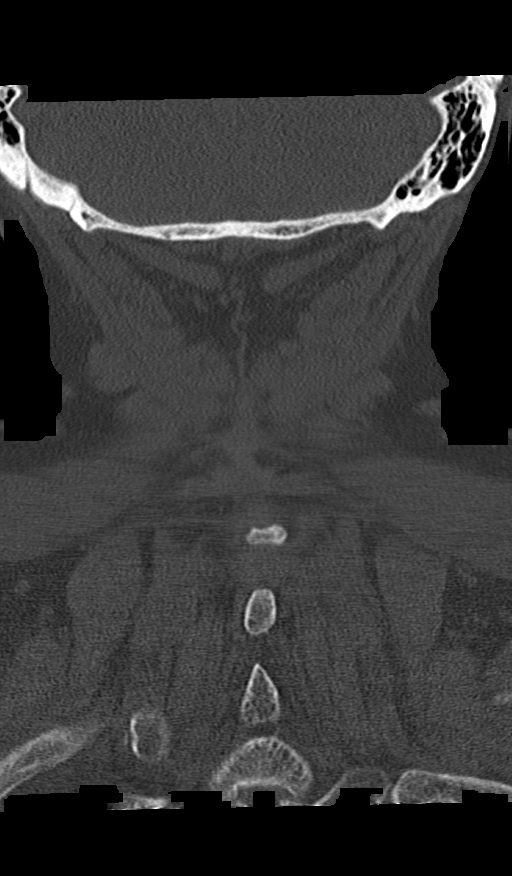

[13 of 33 positions shown; findings below may reference images not displayed]

FINDINGS: CT HEAD FINDINGS

Brain: No evidence of acute infarction, hemorrhage, hydrocephalus,
extra-axial collection or mass lesion/mass effect. Mild for age
chronic microvascular disease and atrophy.

Vascular: No hyperdense vessel identified.

Skull: Right frontal scalp hematoma.  No acute fracture.

Sinuses/Orbits: Visualized sinuses are clear. No acute orbital
findings.

Other: Nodular soft tissue thickening of the right external auditory
canal with nodular soft tissue in the right mastoid antrum.

CT CERVICAL SPINE FINDINGS

Alignment: Mild retrolisthesis of C3 on C4 and mild anterolisthesis
of C6 on C7, similar to prior.

Skull base and vertebrae: Mild height loss of the visualized T2
vertebral body, similar to prior. Otherwise, no vertebral body
height loss. No evidence of acute fracture.

Soft tissues and spinal canal: No prevertebral fluid or swelling. No
visible canal hematoma.

Disc levels: Similar moderate to severe multilevel degenerative disc
disease. Multilevel facet and uncovertebral hypertrophy with varying
degrees of neural foraminal stenosis.

Upper chest: No consolidation the visualized lung apices.
IMPRESSION: CT head:

1. No evidence of acute intracranial abnormality.
2. Right frontal scalp hematoma without calvarial fracture.
3. Nodular soft tissue thickening of the right external auditory
canal with nodular soft tissue in the mastoid antrum. Findings
appear improved relative to maxillofacial CT from June 15, 2020
and could potentially represent malignancy and/or chronic infection.
If not previously referred/evaluated, recommend ENT consultation and
direct inspection.

CT cervical spine:

No evidence of acute fracture or traumatic malalignment.

## 2022-09-07 DIAGNOSIS — R69 Illness, unspecified: Secondary | ICD-10-CM | POA: Diagnosis not present

## 2022-09-08 DIAGNOSIS — R69 Illness, unspecified: Secondary | ICD-10-CM | POA: Diagnosis not present

## 2022-09-08 DIAGNOSIS — I10 Essential (primary) hypertension: Secondary | ICD-10-CM | POA: Diagnosis not present

## 2022-09-09 DIAGNOSIS — R69 Illness, unspecified: Secondary | ICD-10-CM | POA: Diagnosis not present

## 2022-09-10 DIAGNOSIS — R69 Illness, unspecified: Secondary | ICD-10-CM | POA: Diagnosis not present

## 2022-09-11 DIAGNOSIS — R69 Illness, unspecified: Secondary | ICD-10-CM | POA: Diagnosis not present

## 2022-09-12 DIAGNOSIS — R69 Illness, unspecified: Secondary | ICD-10-CM | POA: Diagnosis not present

## 2022-09-13 DIAGNOSIS — R69 Illness, unspecified: Secondary | ICD-10-CM | POA: Diagnosis not present

## 2022-09-14 DIAGNOSIS — R69 Illness, unspecified: Secondary | ICD-10-CM | POA: Diagnosis not present

## 2022-09-15 DIAGNOSIS — R69 Illness, unspecified: Secondary | ICD-10-CM | POA: Diagnosis not present

## 2022-09-16 DIAGNOSIS — R69 Illness, unspecified: Secondary | ICD-10-CM | POA: Diagnosis not present

## 2022-09-17 DIAGNOSIS — R69 Illness, unspecified: Secondary | ICD-10-CM | POA: Diagnosis not present

## 2022-09-18 DIAGNOSIS — E785 Hyperlipidemia, unspecified: Secondary | ICD-10-CM | POA: Diagnosis not present

## 2022-09-18 DIAGNOSIS — G629 Polyneuropathy, unspecified: Secondary | ICD-10-CM | POA: Diagnosis not present

## 2022-09-18 DIAGNOSIS — H9190 Unspecified hearing loss, unspecified ear: Secondary | ICD-10-CM | POA: Diagnosis not present

## 2022-09-18 DIAGNOSIS — E559 Vitamin D deficiency, unspecified: Secondary | ICD-10-CM | POA: Diagnosis not present

## 2022-09-18 DIAGNOSIS — R69 Illness, unspecified: Secondary | ICD-10-CM | POA: Diagnosis not present

## 2022-09-18 DIAGNOSIS — I10 Essential (primary) hypertension: Secondary | ICD-10-CM | POA: Diagnosis not present

## 2022-09-18 DIAGNOSIS — I739 Peripheral vascular disease, unspecified: Secondary | ICD-10-CM | POA: Diagnosis not present

## 2022-09-18 DIAGNOSIS — J302 Other seasonal allergic rhinitis: Secondary | ICD-10-CM | POA: Diagnosis not present

## 2022-09-19 DIAGNOSIS — R69 Illness, unspecified: Secondary | ICD-10-CM | POA: Diagnosis not present

## 2022-09-20 DIAGNOSIS — R69 Illness, unspecified: Secondary | ICD-10-CM | POA: Diagnosis not present

## 2022-09-21 DIAGNOSIS — R69 Illness, unspecified: Secondary | ICD-10-CM | POA: Diagnosis not present

## 2022-09-22 DIAGNOSIS — R69 Illness, unspecified: Secondary | ICD-10-CM | POA: Diagnosis not present

## 2022-09-23 DIAGNOSIS — R69 Illness, unspecified: Secondary | ICD-10-CM | POA: Diagnosis not present

## 2022-09-24 DIAGNOSIS — R279 Unspecified lack of coordination: Secondary | ICD-10-CM | POA: Diagnosis not present

## 2022-09-24 DIAGNOSIS — M6281 Muscle weakness (generalized): Secondary | ICD-10-CM | POA: Diagnosis not present

## 2022-09-24 DIAGNOSIS — S6291XS Unspecified fracture of right wrist and hand, sequela: Secondary | ICD-10-CM | POA: Diagnosis not present

## 2022-09-24 DIAGNOSIS — R69 Illness, unspecified: Secondary | ICD-10-CM | POA: Diagnosis not present

## 2022-09-25 DIAGNOSIS — R69 Illness, unspecified: Secondary | ICD-10-CM | POA: Diagnosis not present

## 2022-09-25 DIAGNOSIS — R279 Unspecified lack of coordination: Secondary | ICD-10-CM | POA: Diagnosis not present

## 2022-09-25 DIAGNOSIS — M6281 Muscle weakness (generalized): Secondary | ICD-10-CM | POA: Diagnosis not present

## 2022-09-25 DIAGNOSIS — S6291XS Unspecified fracture of right wrist and hand, sequela: Secondary | ICD-10-CM | POA: Diagnosis not present

## 2022-09-26 DIAGNOSIS — R69 Illness, unspecified: Secondary | ICD-10-CM | POA: Diagnosis not present

## 2022-09-27 ENCOUNTER — Ambulatory Visit (INDEPENDENT_AMBULATORY_CARE_PROVIDER_SITE_OTHER): Payer: Medicare HMO | Admitting: Orthopedic Surgery

## 2022-09-27 ENCOUNTER — Encounter: Payer: Self-pay | Admitting: Orthopedic Surgery

## 2022-09-27 ENCOUNTER — Ambulatory Visit (INDEPENDENT_AMBULATORY_CARE_PROVIDER_SITE_OTHER): Payer: Medicare HMO

## 2022-09-27 VITALS — Ht 66.0 in | Wt 146.0 lb

## 2022-09-27 DIAGNOSIS — S6291XS Unspecified fracture of right wrist and hand, sequela: Secondary | ICD-10-CM | POA: Diagnosis not present

## 2022-09-27 DIAGNOSIS — R69 Illness, unspecified: Secondary | ICD-10-CM | POA: Diagnosis not present

## 2022-09-27 DIAGNOSIS — S62101D Fracture of unspecified carpal bone, right wrist, subsequent encounter for fracture with routine healing: Secondary | ICD-10-CM | POA: Diagnosis not present

## 2022-09-27 DIAGNOSIS — R279 Unspecified lack of coordination: Secondary | ICD-10-CM | POA: Diagnosis not present

## 2022-09-27 DIAGNOSIS — M6281 Muscle weakness (generalized): Secondary | ICD-10-CM | POA: Diagnosis not present

## 2022-09-27 NOTE — Progress Notes (Signed)
Chief Complaint  Patient presents with   Routine Post Op    Rt wrist DOS 06/29/22    Encounter Diagnosis  Name Primary?   Open wrist fracture, right, with routine healing, subsequent encounter Yes   POD # 90   Imaging of the right wrist there is a T plate fixing the distal radius fracture and a hook plate fixing the ulnar fracture with an interfrag screw.  There is callus forming around both fractures the ulna slightly along the radius is slightly dorsally tilted but the overall construct remains intact  The fracture has essentially healed at this point  No loosening is seen.  She has some stiffness of the index finger the other fingers seem to been fine.  She does have some arthritis of the IP joints of the hand she has functional range of motion of the wrist  I released her she will use the walker as she did before surgery without a brace if she has pain she can put the brace back on otherwise no follow-up needed

## 2022-09-28 DIAGNOSIS — R69 Illness, unspecified: Secondary | ICD-10-CM | POA: Diagnosis not present

## 2022-09-29 DIAGNOSIS — R278 Other lack of coordination: Secondary | ICD-10-CM | POA: Diagnosis not present

## 2022-09-29 DIAGNOSIS — I10 Essential (primary) hypertension: Secondary | ICD-10-CM | POA: Diagnosis not present

## 2022-09-29 DIAGNOSIS — R69 Illness, unspecified: Secondary | ICD-10-CM | POA: Diagnosis not present

## 2022-09-29 DIAGNOSIS — R296 Repeated falls: Secondary | ICD-10-CM | POA: Diagnosis not present

## 2022-09-29 DIAGNOSIS — M6281 Muscle weakness (generalized): Secondary | ICD-10-CM | POA: Diagnosis not present

## 2022-09-30 DIAGNOSIS — R69 Illness, unspecified: Secondary | ICD-10-CM | POA: Diagnosis not present

## 2022-10-01 DIAGNOSIS — E785 Hyperlipidemia, unspecified: Secondary | ICD-10-CM | POA: Diagnosis not present

## 2022-10-01 DIAGNOSIS — M6281 Muscle weakness (generalized): Secondary | ICD-10-CM | POA: Diagnosis not present

## 2022-10-01 DIAGNOSIS — E119 Type 2 diabetes mellitus without complications: Secondary | ICD-10-CM | POA: Diagnosis not present

## 2022-10-01 DIAGNOSIS — E559 Vitamin D deficiency, unspecified: Secondary | ICD-10-CM | POA: Diagnosis not present

## 2022-10-01 DIAGNOSIS — R296 Repeated falls: Secondary | ICD-10-CM | POA: Diagnosis not present

## 2022-10-01 DIAGNOSIS — R69 Illness, unspecified: Secondary | ICD-10-CM | POA: Diagnosis not present

## 2022-10-01 DIAGNOSIS — I10 Essential (primary) hypertension: Secondary | ICD-10-CM | POA: Diagnosis not present

## 2022-10-01 DIAGNOSIS — R278 Other lack of coordination: Secondary | ICD-10-CM | POA: Diagnosis not present

## 2022-10-01 DIAGNOSIS — E038 Other specified hypothyroidism: Secondary | ICD-10-CM | POA: Diagnosis not present

## 2022-10-01 DIAGNOSIS — D518 Other vitamin B12 deficiency anemias: Secondary | ICD-10-CM | POA: Diagnosis not present

## 2022-10-02 DIAGNOSIS — R279 Unspecified lack of coordination: Secondary | ICD-10-CM | POA: Diagnosis not present

## 2022-10-02 DIAGNOSIS — R69 Illness, unspecified: Secondary | ICD-10-CM | POA: Diagnosis not present

## 2022-10-02 DIAGNOSIS — M6281 Muscle weakness (generalized): Secondary | ICD-10-CM | POA: Diagnosis not present

## 2022-10-02 DIAGNOSIS — S6291XS Unspecified fracture of right wrist and hand, sequela: Secondary | ICD-10-CM | POA: Diagnosis not present

## 2022-10-03 DIAGNOSIS — R278 Other lack of coordination: Secondary | ICD-10-CM | POA: Diagnosis not present

## 2022-10-03 DIAGNOSIS — M6281 Muscle weakness (generalized): Secondary | ICD-10-CM | POA: Diagnosis not present

## 2022-10-03 DIAGNOSIS — R296 Repeated falls: Secondary | ICD-10-CM | POA: Diagnosis not present

## 2022-10-03 DIAGNOSIS — R69 Illness, unspecified: Secondary | ICD-10-CM | POA: Diagnosis not present

## 2022-10-03 DIAGNOSIS — I10 Essential (primary) hypertension: Secondary | ICD-10-CM | POA: Diagnosis not present

## 2022-10-04 DIAGNOSIS — R279 Unspecified lack of coordination: Secondary | ICD-10-CM | POA: Diagnosis not present

## 2022-10-04 DIAGNOSIS — R69 Illness, unspecified: Secondary | ICD-10-CM | POA: Diagnosis not present

## 2022-10-04 DIAGNOSIS — S6291XS Unspecified fracture of right wrist and hand, sequela: Secondary | ICD-10-CM | POA: Diagnosis not present

## 2022-10-04 DIAGNOSIS — M6281 Muscle weakness (generalized): Secondary | ICD-10-CM | POA: Diagnosis not present

## 2022-10-04 DIAGNOSIS — F5101 Primary insomnia: Secondary | ICD-10-CM | POA: Diagnosis not present

## 2022-10-04 DIAGNOSIS — F419 Anxiety disorder, unspecified: Secondary | ICD-10-CM | POA: Diagnosis not present

## 2022-10-04 DIAGNOSIS — R443 Hallucinations, unspecified: Secondary | ICD-10-CM | POA: Diagnosis not present

## 2022-10-04 DIAGNOSIS — F03918 Unspecified dementia, unspecified severity, with other behavioral disturbance: Secondary | ICD-10-CM | POA: Diagnosis not present

## 2022-10-05 DIAGNOSIS — R278 Other lack of coordination: Secondary | ICD-10-CM | POA: Diagnosis not present

## 2022-10-05 DIAGNOSIS — R69 Illness, unspecified: Secondary | ICD-10-CM | POA: Diagnosis not present

## 2022-10-05 DIAGNOSIS — R296 Repeated falls: Secondary | ICD-10-CM | POA: Diagnosis not present

## 2022-10-05 DIAGNOSIS — I10 Essential (primary) hypertension: Secondary | ICD-10-CM | POA: Diagnosis not present

## 2022-10-05 DIAGNOSIS — M6281 Muscle weakness (generalized): Secondary | ICD-10-CM | POA: Diagnosis not present

## 2022-10-06 DIAGNOSIS — R69 Illness, unspecified: Secondary | ICD-10-CM | POA: Diagnosis not present

## 2022-10-07 DIAGNOSIS — R69 Illness, unspecified: Secondary | ICD-10-CM | POA: Diagnosis not present

## 2022-10-08 DIAGNOSIS — R279 Unspecified lack of coordination: Secondary | ICD-10-CM | POA: Diagnosis not present

## 2022-10-08 DIAGNOSIS — S6291XS Unspecified fracture of right wrist and hand, sequela: Secondary | ICD-10-CM | POA: Diagnosis not present

## 2022-10-08 DIAGNOSIS — M6281 Muscle weakness (generalized): Secondary | ICD-10-CM | POA: Diagnosis not present

## 2022-10-09 DIAGNOSIS — I10 Essential (primary) hypertension: Secondary | ICD-10-CM | POA: Diagnosis not present

## 2022-10-10 DIAGNOSIS — R279 Unspecified lack of coordination: Secondary | ICD-10-CM | POA: Diagnosis not present

## 2022-10-10 DIAGNOSIS — S6291XS Unspecified fracture of right wrist and hand, sequela: Secondary | ICD-10-CM | POA: Diagnosis not present

## 2022-10-10 DIAGNOSIS — M6281 Muscle weakness (generalized): Secondary | ICD-10-CM | POA: Diagnosis not present

## 2022-10-12 DIAGNOSIS — M6281 Muscle weakness (generalized): Secondary | ICD-10-CM | POA: Diagnosis not present

## 2022-10-12 DIAGNOSIS — R279 Unspecified lack of coordination: Secondary | ICD-10-CM | POA: Diagnosis not present

## 2022-10-12 DIAGNOSIS — R278 Other lack of coordination: Secondary | ICD-10-CM | POA: Diagnosis not present

## 2022-10-12 DIAGNOSIS — I10 Essential (primary) hypertension: Secondary | ICD-10-CM | POA: Diagnosis not present

## 2022-10-12 DIAGNOSIS — S6291XS Unspecified fracture of right wrist and hand, sequela: Secondary | ICD-10-CM | POA: Diagnosis not present

## 2022-10-12 DIAGNOSIS — R296 Repeated falls: Secondary | ICD-10-CM | POA: Diagnosis not present

## 2022-10-15 DIAGNOSIS — R279 Unspecified lack of coordination: Secondary | ICD-10-CM | POA: Diagnosis not present

## 2022-10-15 DIAGNOSIS — S6291XS Unspecified fracture of right wrist and hand, sequela: Secondary | ICD-10-CM | POA: Diagnosis not present

## 2022-10-15 DIAGNOSIS — M6281 Muscle weakness (generalized): Secondary | ICD-10-CM | POA: Diagnosis not present

## 2022-10-17 DIAGNOSIS — R296 Repeated falls: Secondary | ICD-10-CM | POA: Diagnosis not present

## 2022-10-17 DIAGNOSIS — I10 Essential (primary) hypertension: Secondary | ICD-10-CM | POA: Diagnosis not present

## 2022-10-17 DIAGNOSIS — M6281 Muscle weakness (generalized): Secondary | ICD-10-CM | POA: Diagnosis not present

## 2022-10-17 DIAGNOSIS — R278 Other lack of coordination: Secondary | ICD-10-CM | POA: Diagnosis not present

## 2022-10-19 ENCOUNTER — Encounter (HOSPITAL_COMMUNITY): Payer: Self-pay | Admitting: Emergency Medicine

## 2022-10-19 ENCOUNTER — Emergency Department (HOSPITAL_COMMUNITY)
Admission: EM | Admit: 2022-10-19 | Discharge: 2022-10-20 | Disposition: A | Discharge status: Skilled Nursing Facility | Payer: Medicare HMO | Attending: Emergency Medicine | Admitting: Emergency Medicine

## 2022-10-19 ENCOUNTER — Other Ambulatory Visit: Payer: Self-pay

## 2022-10-19 ENCOUNTER — Emergency Department (HOSPITAL_COMMUNITY): Payer: Medicare HMO

## 2022-10-19 DIAGNOSIS — R279 Unspecified lack of coordination: Secondary | ICD-10-CM | POA: Diagnosis not present

## 2022-10-19 DIAGNOSIS — I10 Essential (primary) hypertension: Secondary | ICD-10-CM | POA: Diagnosis not present

## 2022-10-19 DIAGNOSIS — Z96642 Presence of left artificial hip joint: Secondary | ICD-10-CM | POA: Insufficient documentation

## 2022-10-19 DIAGNOSIS — W19XXXA Unspecified fall, initial encounter: Secondary | ICD-10-CM | POA: Diagnosis not present

## 2022-10-19 DIAGNOSIS — M25552 Pain in left hip: Secondary | ICD-10-CM | POA: Insufficient documentation

## 2022-10-19 DIAGNOSIS — Z7901 Long term (current) use of anticoagulants: Secondary | ICD-10-CM | POA: Insufficient documentation

## 2022-10-19 DIAGNOSIS — M6281 Muscle weakness (generalized): Secondary | ICD-10-CM | POA: Diagnosis not present

## 2022-10-19 DIAGNOSIS — R69 Illness, unspecified: Secondary | ICD-10-CM | POA: Diagnosis not present

## 2022-10-19 DIAGNOSIS — F0390 Unspecified dementia without behavioral disturbance: Secondary | ICD-10-CM | POA: Insufficient documentation

## 2022-10-19 DIAGNOSIS — I6782 Cerebral ischemia: Secondary | ICD-10-CM | POA: Diagnosis not present

## 2022-10-19 DIAGNOSIS — Z79899 Other long term (current) drug therapy: Secondary | ICD-10-CM | POA: Diagnosis not present

## 2022-10-19 DIAGNOSIS — R278 Other lack of coordination: Secondary | ICD-10-CM | POA: Diagnosis not present

## 2022-10-19 DIAGNOSIS — S0990XA Unspecified injury of head, initial encounter: Secondary | ICD-10-CM | POA: Diagnosis not present

## 2022-10-19 DIAGNOSIS — G319 Degenerative disease of nervous system, unspecified: Secondary | ICD-10-CM | POA: Diagnosis not present

## 2022-10-19 DIAGNOSIS — R296 Repeated falls: Secondary | ICD-10-CM | POA: Diagnosis not present

## 2022-10-19 DIAGNOSIS — W010XXA Fall on same level from slipping, tripping and stumbling without subsequent striking against object, initial encounter: Secondary | ICD-10-CM | POA: Insufficient documentation

## 2022-10-19 DIAGNOSIS — R030 Elevated blood-pressure reading, without diagnosis of hypertension: Secondary | ICD-10-CM | POA: Diagnosis not present

## 2022-10-19 DIAGNOSIS — Z743 Need for continuous supervision: Secondary | ICD-10-CM | POA: Diagnosis not present

## 2022-10-19 DIAGNOSIS — S6291XS Unspecified fracture of right wrist and hand, sequela: Secondary | ICD-10-CM | POA: Diagnosis not present

## 2022-10-19 MED ORDER — ACETAMINOPHEN 325 MG PO TABS
650.0000 mg | ORAL_TABLET | Freq: Once | ORAL | Status: AC
  Administered 2022-10-19: 650 mg via ORAL
  Filled 2022-10-19: qty 2

## 2022-10-19 NOTE — ED Notes (Signed)
Patient transported to CT 

## 2022-10-19 NOTE — Discharge Instructions (Signed)
Apply ice to any sore area.  Ice to be applied for 30 minutes at a time, 4 times a day.  You may take acetaminophen as needed for pain.  Your blood pressure was very high today which may be related to the stress of coming to the emergency department.  Please have your blood pressure rechecked several times over the next week.  If it continues to be high, you may need to adjust your blood pressure medication.

## 2022-10-19 NOTE — ED Triage Notes (Signed)
Pt BIB [facility] from [facility]. Pt had unwitnessed fall. Pt states she tripped reaching out for her Neka Bise.  Pt c/o left hip pain, and thinks she may have hit her head. Pt does take eliquis.

## 2022-10-19 NOTE — ED Notes (Signed)
ED Provider at bedside. 

## 2022-10-19 NOTE — ED Notes (Signed)
Pt was assisted to a standing position with this RN and NT. Pt able to lift both legs and take steps with assistance. EDP made aware

## 2022-10-19 NOTE — ED Provider Notes (Signed)
[facility] Provider Note   CSN: [id number] Arrival date & time: 10/19/22  2230     History  Chief Complaint  Patient presents with   Christie Nelson is a 89 y.o. female.  The history is provided by the patient.  Fall  She has history of hypertension, GERD, prolactinoma, stroke, paroxysmal atrial fibrillation anticoagulated on apixaban and comes in following a fall at home.  She states that she slipped on her floor and did hit her head.  She denies loss of consciousness.  She fell on her left side but denies significant injury.   Home Medications Prior to Admission medications   Medication Sig Start Date End Date Taking? Authorizing Provider  acetaminophen (TYLENOL) 500 MG tablet Take 1 tablet by mouth 3 (three) times daily as needed. 02/06/22   [provider]  apixaban (ELIQUIS) 5 MG TABS tablet Take 1 tablet (5 mg total) by mouth 2 (two) times daily. 03/20/22   Arnoldo Lenis, MD  atorvastatin (LIPITOR) 10 MG tablet Take 1 tablet by mouth at bedtime. 02/06/22   [provider]  Cholecalciferol (VITAMIN D3) 250 MCG (10000 UT) TABS Take 1 tablet by mouth daily.    [provider]  docusate sodium (COLACE) 100 MG capsule Take 1 capsule (100 mg total) by mouth 2 (two) times daily. 07/03/22 07/03/23  Lavina Hamman, MD  HYDROcodone-acetaminophen (NORCO/VICODIN) 5-325 MG tablet Take 1 tablet by mouth 2 (two) times daily as needed for severe pain. 07/03/22 07/03/23  Lavina Hamman, MD  PARoxetine (PAXIL) 10 MG tablet Take 10 mg by mouth daily. 01/09/22   [provider]  polyethylene glycol (MIRALAX / GLYCOLAX) 17 g packet Take 17 g by mouth daily as needed for mild constipation. 07/03/22   Lavina Hamman, MD  polyethylene glycol (MIRALAX) 17 g packet Take 17 g by mouth daily. 07/03/22   Lavina Hamman, MD      Allergies    Patient has no known allergies.    Review of Systems   Review of  Systems  All other systems reviewed and are negative.   Physical Exam Updated Vital Signs BP (!) 207/79 (BP Location: Left Arm)   Pulse 83   Temp 98 F (36.7 C) (Oral)   Resp 17   Ht 5' 6"$  (1.676 m)   Wt 66.2 kg   SpO2 93%   BMI 23.56 kg/m  Physical Exam Vitals and nursing note reviewed.   89 year old female, resting comfortably and in no acute distress. Vital signs are significant for elevated blood pressure. Oxygen saturation is 93%, which is normal. Head is normocephalic and atraumatic. PERRLA, EOMI. Oropharynx is clear. Neck is nontender without adenopathy or JVD. Back is nontender and there is no CVA tenderness. Lungs are clear without rales, wheezes, or rhonchi. Chest is nontender. Heart has regular rate and rhythm with occasional extra beats, without murmur. Abdomen is soft, flat, nontender. Pelvis is stable and nontender. Extremities have no cyanosis or edema, full range of motion is present without pain. Skin is warm and dry without rash. Neurologic: Mental status is normal, cranial nerves are intact moves all extremities equally.  ED Results / Procedures / Treatments    EKG EKG Interpretation  Date/Time:  Friday October 19 2022 22:39:49 EST Ventricular Rate:  94 PR Interval:    QRS Duration: 148 QT Interval:  454 QTC Calculation: 473 R Axis:   43 Text Interpretation: Normal  sinus rhythm paaired ventricular premature complexes Right bundle branch block Since last tracing more ectopy seen Confirmed by Noemi Chapel [id number]) on 10/19/2022 10:46:14 PM  Radiology CT Head Wo Contrast  Result Date: 10/19/2022 CLINICAL DATA:  Head trauma EXAM: CT HEAD WITHOUT CONTRAST TECHNIQUE: Contiguous axial images were obtained from the base of the skull through the vertex without intravenous contrast. RADIATION DOSE REDUCTION: This exam was performed according to the departmental dose-optimization program which includes automated exposure control, adjustment of the mA and/or kV  according to patient size and/or use of iterative reconstruction technique. COMPARISON:  CT brain 07/01/2022 FINDINGS: Brain: No acute territorial infarction, hemorrhage or intracranial mass. Mild atrophy. Mild chronic small vessel ischemic changes of the white matter. Stable ventricle size. Vascular: No hyperdense vessels.  Carotid vascular calcification Skull: Normal. Negative for fracture or focal lesion. Sinuses/Orbits: No acute finding. Other: None IMPRESSION: 1. No CT evidence for acute intracranial abnormality. 2. Atrophy and chronic small vessel ischemic changes of the white matter. Electronically Signed   By: Donavan Foil M.D.   On: 10/19/2022 23:03    Procedures Procedures  Cardiac monitor shows sinus rhythm with occasional PVCs, per my interpretation.  Medications Ordered in ED Medications - No data to display  ED Course/ Medical Decision Making/ A&P                             Medical Decision Making Amount and/or Complexity of Data Reviewed Radiology: ordered.   Slip and fall and patient who is anticoagulated.  No evidence of significant injury from fall.  CT scan of head was ordered at triage and shows no evidence of acute injury.  I have independently viewed the images, and agree with the radiologist's interpretation.  I have reviewed and interpreted her electrocardiogram and my interpretation is sinus rhythm with PVCs, right bundle branch block not significantly changed from prior.  I am discharging her to return home, take acetaminophen as needed for pain.  I have reviewed her past records, she had 2 ED visits for falls in 2021, 2 ED visits for occult falls in 2023.  If falls become more frequent, relative risk versus benefit of anticoagulation may need to be reconsidered.  Blood pressure will need to be repeated as outpatient.  It is noted that during her hospitalization on 06/27/2022 blood pressure was initially very high and came down with any antihypertensive medication being  given.  Final Clinical Impression(s) / ED Diagnoses Final diagnoses:  Fall from slipping, initial encounter  Chronic anticoagulation  Elevated blood pressure reading with diagnosis of hypertension    Rx / DC Orders ED Discharge Orders     None         Delora Fuel, MD [id number]

## 2022-10-20 ENCOUNTER — Other Ambulatory Visit: Payer: Self-pay

## 2022-10-20 ENCOUNTER — Emergency Department (HOSPITAL_COMMUNITY): Payer: Medicare HMO

## 2022-10-20 ENCOUNTER — Emergency Department (HOSPITAL_COMMUNITY)
Admission: EM | Admit: 2022-10-20 | Discharge: 2022-10-21 | Disposition: A | Discharge status: Skilled Nursing Facility | Payer: Medicare HMO | Source: Home / Self Care | Attending: Emergency Medicine | Admitting: Emergency Medicine

## 2022-10-20 ENCOUNTER — Encounter (HOSPITAL_COMMUNITY): Payer: Self-pay

## 2022-10-20 DIAGNOSIS — M25552 Pain in left hip: Secondary | ICD-10-CM | POA: Insufficient documentation

## 2022-10-20 DIAGNOSIS — Z96642 Presence of left artificial hip joint: Secondary | ICD-10-CM | POA: Insufficient documentation

## 2022-10-20 DIAGNOSIS — Z7901 Long term (current) use of anticoagulants: Secondary | ICD-10-CM | POA: Insufficient documentation

## 2022-10-20 DIAGNOSIS — F0390 Unspecified dementia without behavioral disturbance: Secondary | ICD-10-CM | POA: Insufficient documentation

## 2022-10-20 DIAGNOSIS — I10 Essential (primary) hypertension: Secondary | ICD-10-CM | POA: Diagnosis not present

## 2022-10-20 DIAGNOSIS — Z743 Need for continuous supervision: Secondary | ICD-10-CM | POA: Diagnosis not present

## 2022-10-20 DIAGNOSIS — R001 Bradycardia, unspecified: Secondary | ICD-10-CM | POA: Diagnosis not present

## 2022-10-20 DIAGNOSIS — W19XXXA Unspecified fall, initial encounter: Secondary | ICD-10-CM | POA: Insufficient documentation

## 2022-10-20 MED ORDER — LISINOPRIL 10 MG PO TABS: 10.0000 mg | ORAL_TABLET | Freq: Every day | ORAL | 2 refills | Status: DC

## 2022-10-20 NOTE — ED Triage Notes (Signed)
Patient evaluated for fall last night. Per staff patient having left hip pain today. Patient denying any pain

## 2022-10-20 NOTE — ED Notes (Signed)
LM with [facility]

## 2022-10-20 NOTE — Discharge Instructions (Addendum)
Thank you for allowing me be part of your care today.  Your x-ray was reassuring and showed no broken bones or dislocation of your hips.  Please follow-up with your primary care provider if you continue to have pain.

## 2022-10-20 NOTE — ED Provider Notes (Cosign Needed Addendum)
[facility] Provider Note   CSN: [id number] Arrival date & time: 10/20/22  1238     History  Chief Complaint  Patient presents with   Hip Pain    Christie Nelson is a 89 y.o. female with history significant for stroke, hypertension, prolactinoma, dementia returns to the ED from [facility] and with complaint of left hip pain from fall.  Patient was evaluated last night for the fall but was not complaining of left hip pain at that time.  Per staff, she was complaining today of pain.  Patient denies pain upon arrival in ED.       Home Medications Prior to Admission medications   Medication Sig Start Date End Date Taking? Authorizing Provider  acetaminophen (TYLENOL) 500 MG tablet Take 1 tablet by mouth 3 (three) times daily as needed. 02/06/22   [provider]  apixaban (ELIQUIS) 5 MG TABS tablet Take 1 tablet (5 mg total) by mouth 2 (two) times daily. 03/20/22   Arnoldo Lenis, MD  atorvastatin (LIPITOR) 10 MG tablet Take 1 tablet by mouth at bedtime. 02/06/22   [provider]  Cholecalciferol (VITAMIN D3) 250 MCG (10000 UT) TABS Take 1 tablet by mouth daily.    [provider]  docusate sodium (COLACE) 100 MG capsule Take 1 capsule (100 mg total) by mouth 2 (two) times daily. 07/03/22 07/03/23  Lavina Hamman, MD  HYDROcodone-acetaminophen (NORCO/VICODIN) 5-325 MG tablet Take 1 tablet by mouth 2 (two) times daily as needed for severe pain. 07/03/22 07/03/23  Lavina Hamman, MD  PARoxetine (PAXIL) 10 MG tablet Take 10 mg by mouth daily. 01/09/22   [provider]  polyethylene glycol (MIRALAX / GLYCOLAX) 17 g packet Take 17 g by mouth daily as needed for mild constipation. 07/03/22   Lavina Hamman, MD  polyethylene glycol (MIRALAX) 17 g packet Take 17 g by mouth daily. 07/03/22   Lavina Hamman, MD      Allergies    Patient has no known allergies.    Review of Systems   Review of  Systems  Unable to perform ROS: Dementia  Musculoskeletal:  Positive for arthralgias (left hip pain).    Physical Exam Updated Vital Signs BP (!) 161/97 (BP Location: Left Arm)   Pulse 75   Temp 98.6 F (37 C) (Oral)   Resp 16   Ht 5' 6"$  (1.676 m)   Wt 66.2 kg   SpO2 93%   BMI 23.56 kg/m  Physical Exam Vitals and nursing note reviewed.  Constitutional:      General: She is not in acute distress.    Appearance: Normal appearance. She is not ill-appearing or diaphoretic.  Cardiovascular:     Rate and Rhythm: Normal rate and regular rhythm.     Pulses:          Dorsalis pedis pulses are 2+ on the right side and 2+ on the left side.  Pulmonary:     Effort: Pulmonary effort is normal.  Musculoskeletal:     Right hip: Normal. No tenderness, bony tenderness or crepitus.     Left hip: Normal. No tenderness, bony tenderness or crepitus.     Comments: Patient not complaining of pain upon palpation of hips; no obvious injury or ecchymosis to the left hip  Neurological:     Mental Status: She is alert. Mental status is at baseline.     Sensory: Sensation is intact.  Comments: Sensation intact in bilateral lower extremities  Psychiatric:        Mood and Affect: Mood normal.        Behavior: Behavior normal.     ED Results / Procedures / Treatments   Labs (all labs ordered are listed, but only abnormal results are displayed) Labs Reviewed - No data to display  EKG None  Radiology DG HIP UNILAT WITH PELVIS 2-3 VIEWS LEFT  Result Date: 10/20/2022 CLINICAL DATA:  Fall last night.  Left hip pain. EXAM: DG HIP (WITH OR WITHOUT PELVIS) 2-3V LEFT COMPARISON:  06/27/2022 FINDINGS: No fracture.  No bone lesion. Left hip total arthroplasty appears well seated and aligned and unchanged. More complex right total hip arthroplasty also intact and normally aligned and unchanged. SI joints and symphysis pubis normally aligned. Skeletal structures are diffusely demineralized. Soft tissues  are unremarkable. IMPRESSION: 1. No fracture or dislocation. 2. No evidence of loosening of the left total hip arthroplasty. Electronically Signed   By: Lajean Manes M.D.   On: 10/20/2022 14:13   CT Head Wo Contrast  Result Date: 10/19/2022 CLINICAL DATA:  Head trauma EXAM: CT HEAD WITHOUT CONTRAST TECHNIQUE: Contiguous axial images were obtained from the base of the skull through the vertex without intravenous contrast. RADIATION DOSE REDUCTION: This exam was performed according to the departmental dose-optimization program which includes automated exposure control, adjustment of the mA and/or kV according to patient size and/or use of iterative reconstruction technique. COMPARISON:  CT brain 07/01/2022 FINDINGS: Brain: No acute territorial infarction, hemorrhage or intracranial mass. Mild atrophy. Mild chronic small vessel ischemic changes of the white matter. Stable ventricle size. Vascular: No hyperdense vessels.  Carotid vascular calcification Skull: Normal. Negative for fracture or focal lesion. Sinuses/Orbits: No acute finding. Other: None IMPRESSION: 1. No CT evidence for acute intracranial abnormality. 2. Atrophy and chronic small vessel ischemic changes of the white matter. Electronically Signed   By: Donavan Foil M.D.   On: 10/19/2022 23:03    Procedures Procedures    Medications Ordered in ED Medications - No data to display  ED Course/ Medical Decision Making/ A&P                             Medical Decision Making Amount and/or Complexity of Data Reviewed Radiology: ordered.   Patient presents to the hospital from place of residence where staff states that she was complaining of left hip pain today.  Patient was evaluated in ED yesterday for a fall with injury.  At that time, patient was not complaining of left hip pain.  Upon arrival in the ED, patient is not currently complaining of any pain.  Patient's history is significant for dementia.  I ordered and personally  interpreted the following imaging: Unilateral hip x-ray ray of the left side with pelvis revealed no evidence of fracture or dislocation, left hip total arthroplasty appears to be in place and appropriate.  I agree with radiologist interpretation.  Discussed workup results with family at bedside.  On exam, patient does not complain of pain upon palpation of both hips.  There is no obvious injury to the left hip, no ecchymosis, no redness.  She does appear to have significant dementia and does not always answer questions appropriately.  This is the patient's reported baseline.  RN informed me that she has been increasingly hypertensive while awaiting transport back to her facility.  Patient has history of hypertension but does not appear  to be on any medication.  Upon record review from hospital admission in 06/2022, she was on diuretics to treat hypertension but they were held.  Will start patient on 10 mg lisinopril for uncontrolled hypertension.    Patient does not have any evidence of acute fracture or dislocation in the hip.  I feel that patient is appropriate to return to place of residence.  The patient has been appropriately medically screened and/or stabilized in the ED. I have low suspicion for any other emergent medical condition which would require further screening, evaluation or treatment in the ED or require inpatient management. At time of discharge the patient is hemodynamically stable and in no acute distress. I have discussed work-up results and diagnosis with patient and answered all questions. Patient is agreeable with discharge plan. We discussed strict return precautions for returning to the emergency department and they verbalized understanding.            Final Clinical Impression(s) / ED Diagnoses Final diagnoses:  Left hip pain    Rx / DC Orders ED Discharge Orders     None         Pat Kocher, PA 10/20/22 1526    Theressa Stamps [place], [place] 10/20/22 1715     Davonna Belling, MD 10/21/22 [phone]

## 2022-10-21 DIAGNOSIS — I959 Hypotension, unspecified: Secondary | ICD-10-CM | POA: Diagnosis not present

## 2022-10-21 DIAGNOSIS — M25552 Pain in left hip: Secondary | ICD-10-CM | POA: Diagnosis not present

## 2022-10-21 DIAGNOSIS — Z7401 Bed confinement status: Secondary | ICD-10-CM | POA: Diagnosis not present

## 2022-10-21 DIAGNOSIS — W19XXXA Unspecified fall, initial encounter: Secondary | ICD-10-CM | POA: Diagnosis not present

## 2022-10-21 NOTE — ED Notes (Signed)
Pt exited building with [facility] to [place] [facility] with belongings.

## 2022-10-21 NOTE — ED Notes (Signed)
Pt received and is currently eating breakfast.

## 2022-10-23 DIAGNOSIS — R4189 Other symptoms and signs involving cognitive functions and awareness: Secondary | ICD-10-CM | POA: Diagnosis not present

## 2022-10-23 DIAGNOSIS — J302 Other seasonal allergic rhinitis: Secondary | ICD-10-CM | POA: Diagnosis not present

## 2022-10-23 DIAGNOSIS — E785 Hyperlipidemia, unspecified: Secondary | ICD-10-CM | POA: Diagnosis not present

## 2022-10-23 DIAGNOSIS — I739 Peripheral vascular disease, unspecified: Secondary | ICD-10-CM | POA: Diagnosis not present

## 2022-10-23 DIAGNOSIS — I10 Essential (primary) hypertension: Secondary | ICD-10-CM | POA: Diagnosis not present

## 2022-10-23 DIAGNOSIS — G629 Polyneuropathy, unspecified: Secondary | ICD-10-CM | POA: Diagnosis not present

## 2022-10-23 DIAGNOSIS — E559 Vitamin D deficiency, unspecified: Secondary | ICD-10-CM | POA: Diagnosis not present

## 2022-10-23 DIAGNOSIS — R69 Illness, unspecified: Secondary | ICD-10-CM | POA: Diagnosis not present

## 2022-10-23 DIAGNOSIS — R300 Dysuria: Secondary | ICD-10-CM | POA: Diagnosis not present

## 2022-10-24 DIAGNOSIS — R296 Repeated falls: Secondary | ICD-10-CM | POA: Diagnosis not present

## 2022-10-24 DIAGNOSIS — I10 Essential (primary) hypertension: Secondary | ICD-10-CM | POA: Diagnosis not present

## 2022-10-24 DIAGNOSIS — M6281 Muscle weakness (generalized): Secondary | ICD-10-CM | POA: Diagnosis not present

## 2022-10-24 DIAGNOSIS — R278 Other lack of coordination: Secondary | ICD-10-CM | POA: Diagnosis not present

## 2022-10-25 DIAGNOSIS — D518 Other vitamin B12 deficiency anemias: Secondary | ICD-10-CM | POA: Diagnosis not present

## 2022-10-25 DIAGNOSIS — E038 Other specified hypothyroidism: Secondary | ICD-10-CM | POA: Diagnosis not present

## 2022-10-25 DIAGNOSIS — E119 Type 2 diabetes mellitus without complications: Secondary | ICD-10-CM | POA: Diagnosis not present

## 2022-10-25 DIAGNOSIS — E559 Vitamin D deficiency, unspecified: Secondary | ICD-10-CM | POA: Diagnosis not present

## 2022-10-25 DIAGNOSIS — I10 Essential (primary) hypertension: Secondary | ICD-10-CM | POA: Diagnosis not present

## 2022-10-25 DIAGNOSIS — E782 Mixed hyperlipidemia: Secondary | ICD-10-CM | POA: Diagnosis not present

## 2022-10-25 DIAGNOSIS — N390 Urinary tract infection, site not specified: Secondary | ICD-10-CM | POA: Diagnosis not present

## 2022-10-26 DIAGNOSIS — R278 Other lack of coordination: Secondary | ICD-10-CM | POA: Diagnosis not present

## 2022-10-26 DIAGNOSIS — M6281 Muscle weakness (generalized): Secondary | ICD-10-CM | POA: Diagnosis not present

## 2022-10-26 DIAGNOSIS — R296 Repeated falls: Secondary | ICD-10-CM | POA: Diagnosis not present

## 2022-10-26 DIAGNOSIS — I10 Essential (primary) hypertension: Secondary | ICD-10-CM | POA: Diagnosis not present

## 2022-10-29 DIAGNOSIS — R278 Other lack of coordination: Secondary | ICD-10-CM | POA: Diagnosis not present

## 2022-10-29 DIAGNOSIS — M6281 Muscle weakness (generalized): Secondary | ICD-10-CM | POA: Diagnosis not present

## 2022-10-29 DIAGNOSIS — R296 Repeated falls: Secondary | ICD-10-CM | POA: Diagnosis not present

## 2022-10-29 DIAGNOSIS — I10 Essential (primary) hypertension: Secondary | ICD-10-CM | POA: Diagnosis not present

## 2022-10-31 DIAGNOSIS — M6281 Muscle weakness (generalized): Secondary | ICD-10-CM | POA: Diagnosis not present

## 2022-10-31 DIAGNOSIS — I10 Essential (primary) hypertension: Secondary | ICD-10-CM | POA: Diagnosis not present

## 2022-10-31 DIAGNOSIS — R296 Repeated falls: Secondary | ICD-10-CM | POA: Diagnosis not present

## 2022-10-31 DIAGNOSIS — R278 Other lack of coordination: Secondary | ICD-10-CM | POA: Diagnosis not present

## 2022-11-01 DIAGNOSIS — R443 Hallucinations, unspecified: Secondary | ICD-10-CM | POA: Diagnosis not present

## 2022-11-01 DIAGNOSIS — R69 Illness, unspecified: Secondary | ICD-10-CM | POA: Diagnosis not present

## 2022-11-01 DIAGNOSIS — F419 Anxiety disorder, unspecified: Secondary | ICD-10-CM | POA: Diagnosis not present

## 2022-11-01 DIAGNOSIS — F5101 Primary insomnia: Secondary | ICD-10-CM | POA: Diagnosis not present

## 2022-11-01 DIAGNOSIS — F03918 Unspecified dementia, unspecified severity, with other behavioral disturbance: Secondary | ICD-10-CM | POA: Diagnosis not present

## 2022-11-07 DIAGNOSIS — B351 Tinea unguium: Secondary | ICD-10-CM | POA: Diagnosis not present

## 2022-11-07 DIAGNOSIS — M79672 Pain in left foot: Secondary | ICD-10-CM | POA: Diagnosis not present

## 2022-11-07 DIAGNOSIS — M79671 Pain in right foot: Secondary | ICD-10-CM | POA: Diagnosis not present

## 2022-11-07 DIAGNOSIS — Z9181 History of falling: Secondary | ICD-10-CM | POA: Diagnosis not present

## 2022-11-07 DIAGNOSIS — L600 Ingrowing nail: Secondary | ICD-10-CM | POA: Diagnosis not present

## 2022-11-07 DIAGNOSIS — I10 Essential (primary) hypertension: Secondary | ICD-10-CM | POA: Diagnosis not present

## 2022-11-07 DIAGNOSIS — R296 Repeated falls: Secondary | ICD-10-CM | POA: Diagnosis not present

## 2022-11-07 DIAGNOSIS — R279 Unspecified lack of coordination: Secondary | ICD-10-CM | POA: Diagnosis not present

## 2022-11-07 DIAGNOSIS — R278 Other lack of coordination: Secondary | ICD-10-CM | POA: Diagnosis not present

## 2022-11-07 DIAGNOSIS — M6281 Muscle weakness (generalized): Secondary | ICD-10-CM | POA: Diagnosis not present

## 2022-11-08 DIAGNOSIS — I10 Essential (primary) hypertension: Secondary | ICD-10-CM | POA: Diagnosis not present

## 2022-11-13 DIAGNOSIS — Z9181 History of falling: Secondary | ICD-10-CM | POA: Diagnosis not present

## 2022-11-13 DIAGNOSIS — R278 Other lack of coordination: Secondary | ICD-10-CM | POA: Diagnosis not present

## 2022-11-13 DIAGNOSIS — M6281 Muscle weakness (generalized): Secondary | ICD-10-CM | POA: Diagnosis not present

## 2022-11-16 DIAGNOSIS — Z9181 History of falling: Secondary | ICD-10-CM | POA: Diagnosis not present

## 2022-11-16 DIAGNOSIS — R278 Other lack of coordination: Secondary | ICD-10-CM | POA: Diagnosis not present

## 2022-11-16 DIAGNOSIS — M6281 Muscle weakness (generalized): Secondary | ICD-10-CM | POA: Diagnosis not present

## 2022-11-19 DIAGNOSIS — M6281 Muscle weakness (generalized): Secondary | ICD-10-CM | POA: Diagnosis not present

## 2022-11-19 DIAGNOSIS — R278 Other lack of coordination: Secondary | ICD-10-CM | POA: Diagnosis not present

## 2022-11-19 DIAGNOSIS — Z9181 History of falling: Secondary | ICD-10-CM | POA: Diagnosis not present

## 2022-11-22 DIAGNOSIS — Z79899 Other long term (current) drug therapy: Secondary | ICD-10-CM | POA: Diagnosis not present

## 2022-11-22 DIAGNOSIS — E119 Type 2 diabetes mellitus without complications: Secondary | ICD-10-CM | POA: Diagnosis not present

## 2022-11-22 DIAGNOSIS — E038 Other specified hypothyroidism: Secondary | ICD-10-CM | POA: Diagnosis not present

## 2022-11-22 DIAGNOSIS — E782 Mixed hyperlipidemia: Secondary | ICD-10-CM | POA: Diagnosis not present

## 2022-11-22 DIAGNOSIS — D518 Other vitamin B12 deficiency anemias: Secondary | ICD-10-CM | POA: Diagnosis not present

## 2022-11-26 DIAGNOSIS — M6281 Muscle weakness (generalized): Secondary | ICD-10-CM | POA: Diagnosis not present

## 2022-11-26 DIAGNOSIS — Z9181 History of falling: Secondary | ICD-10-CM | POA: Diagnosis not present

## 2022-11-26 DIAGNOSIS — R278 Other lack of coordination: Secondary | ICD-10-CM | POA: Diagnosis not present

## 2022-11-27 DIAGNOSIS — E559 Vitamin D deficiency, unspecified: Secondary | ICD-10-CM | POA: Diagnosis not present

## 2022-11-27 DIAGNOSIS — R69 Illness, unspecified: Secondary | ICD-10-CM | POA: Diagnosis not present

## 2022-11-27 DIAGNOSIS — R4189 Other symptoms and signs involving cognitive functions and awareness: Secondary | ICD-10-CM | POA: Diagnosis not present

## 2022-11-27 DIAGNOSIS — G629 Polyneuropathy, unspecified: Secondary | ICD-10-CM | POA: Diagnosis not present

## 2022-11-27 DIAGNOSIS — E785 Hyperlipidemia, unspecified: Secondary | ICD-10-CM | POA: Diagnosis not present

## 2022-11-27 DIAGNOSIS — I70223 Atherosclerosis of native arteries of extremities with rest pain, bilateral legs: Secondary | ICD-10-CM | POA: Diagnosis not present

## 2022-11-27 DIAGNOSIS — E038 Other specified hypothyroidism: Secondary | ICD-10-CM | POA: Diagnosis not present

## 2022-11-27 DIAGNOSIS — E7849 Other hyperlipidemia: Secondary | ICD-10-CM | POA: Diagnosis not present

## 2022-11-27 DIAGNOSIS — D518 Other vitamin B12 deficiency anemias: Secondary | ICD-10-CM | POA: Diagnosis not present

## 2022-11-27 DIAGNOSIS — I10 Essential (primary) hypertension: Secondary | ICD-10-CM | POA: Diagnosis not present

## 2022-11-27 DIAGNOSIS — E119 Type 2 diabetes mellitus without complications: Secondary | ICD-10-CM | POA: Diagnosis not present

## 2022-11-27 DIAGNOSIS — J302 Other seasonal allergic rhinitis: Secondary | ICD-10-CM | POA: Diagnosis not present

## 2022-11-30 DIAGNOSIS — R278 Other lack of coordination: Secondary | ICD-10-CM | POA: Diagnosis not present

## 2022-11-30 DIAGNOSIS — M6281 Muscle weakness (generalized): Secondary | ICD-10-CM | POA: Diagnosis not present

## 2022-11-30 DIAGNOSIS — Z9181 History of falling: Secondary | ICD-10-CM | POA: Diagnosis not present

## 2022-12-05 DIAGNOSIS — R296 Repeated falls: Secondary | ICD-10-CM | POA: Diagnosis not present

## 2022-12-05 DIAGNOSIS — R278 Other lack of coordination: Secondary | ICD-10-CM | POA: Diagnosis not present

## 2022-12-05 DIAGNOSIS — I10 Essential (primary) hypertension: Secondary | ICD-10-CM | POA: Diagnosis not present

## 2022-12-05 DIAGNOSIS — M6281 Muscle weakness (generalized): Secondary | ICD-10-CM | POA: Diagnosis not present

## 2022-12-05 DIAGNOSIS — Z9181 History of falling: Secondary | ICD-10-CM | POA: Diagnosis not present

## 2022-12-06 DIAGNOSIS — F03918 Unspecified dementia, unspecified severity, with other behavioral disturbance: Secondary | ICD-10-CM | POA: Diagnosis not present

## 2022-12-06 DIAGNOSIS — F5101 Primary insomnia: Secondary | ICD-10-CM | POA: Diagnosis not present

## 2022-12-06 DIAGNOSIS — R443 Hallucinations, unspecified: Secondary | ICD-10-CM | POA: Diagnosis not present

## 2022-12-06 DIAGNOSIS — F419 Anxiety disorder, unspecified: Secondary | ICD-10-CM | POA: Diagnosis not present

## 2022-12-06 DIAGNOSIS — R69 Illness, unspecified: Secondary | ICD-10-CM | POA: Diagnosis not present

## 2022-12-10 DIAGNOSIS — M6281 Muscle weakness (generalized): Secondary | ICD-10-CM | POA: Diagnosis not present

## 2022-12-10 DIAGNOSIS — R278 Other lack of coordination: Secondary | ICD-10-CM | POA: Diagnosis not present

## 2022-12-10 DIAGNOSIS — Z9181 History of falling: Secondary | ICD-10-CM | POA: Diagnosis not present

## 2022-12-12 DIAGNOSIS — M6281 Muscle weakness (generalized): Secondary | ICD-10-CM | POA: Diagnosis not present

## 2022-12-12 DIAGNOSIS — Z9181 History of falling: Secondary | ICD-10-CM | POA: Diagnosis not present

## 2022-12-12 DIAGNOSIS — R278 Other lack of coordination: Secondary | ICD-10-CM | POA: Diagnosis not present

## 2022-12-14 DIAGNOSIS — M6281 Muscle weakness (generalized): Secondary | ICD-10-CM | POA: Diagnosis not present

## 2022-12-14 DIAGNOSIS — R296 Repeated falls: Secondary | ICD-10-CM | POA: Diagnosis not present

## 2022-12-14 DIAGNOSIS — Z9181 History of falling: Secondary | ICD-10-CM | POA: Diagnosis not present

## 2022-12-14 DIAGNOSIS — R278 Other lack of coordination: Secondary | ICD-10-CM | POA: Diagnosis not present

## 2022-12-14 DIAGNOSIS — I10 Essential (primary) hypertension: Secondary | ICD-10-CM | POA: Diagnosis not present

## 2022-12-17 DIAGNOSIS — Z9181 History of falling: Secondary | ICD-10-CM | POA: Diagnosis not present

## 2022-12-17 DIAGNOSIS — R278 Other lack of coordination: Secondary | ICD-10-CM | POA: Diagnosis not present

## 2022-12-17 DIAGNOSIS — M6281 Muscle weakness (generalized): Secondary | ICD-10-CM | POA: Diagnosis not present

## 2022-12-17 DIAGNOSIS — I10 Essential (primary) hypertension: Secondary | ICD-10-CM | POA: Diagnosis not present

## 2022-12-17 DIAGNOSIS — R296 Repeated falls: Secondary | ICD-10-CM | POA: Diagnosis not present

## 2022-12-19 DIAGNOSIS — Z9181 History of falling: Secondary | ICD-10-CM | POA: Diagnosis not present

## 2022-12-19 DIAGNOSIS — R296 Repeated falls: Secondary | ICD-10-CM | POA: Diagnosis not present

## 2022-12-19 DIAGNOSIS — R278 Other lack of coordination: Secondary | ICD-10-CM | POA: Diagnosis not present

## 2022-12-19 DIAGNOSIS — I10 Essential (primary) hypertension: Secondary | ICD-10-CM | POA: Diagnosis not present

## 2022-12-19 DIAGNOSIS — M6281 Muscle weakness (generalized): Secondary | ICD-10-CM | POA: Diagnosis not present

## 2022-12-24 DIAGNOSIS — F419 Anxiety disorder, unspecified: Secondary | ICD-10-CM | POA: Diagnosis not present

## 2022-12-24 DIAGNOSIS — E038 Other specified hypothyroidism: Secondary | ICD-10-CM | POA: Diagnosis not present

## 2022-12-24 DIAGNOSIS — I70223 Atherosclerosis of native arteries of extremities with rest pain, bilateral legs: Secondary | ICD-10-CM | POA: Diagnosis not present

## 2022-12-24 DIAGNOSIS — M6281 Muscle weakness (generalized): Secondary | ICD-10-CM | POA: Diagnosis not present

## 2022-12-24 DIAGNOSIS — I10 Essential (primary) hypertension: Secondary | ICD-10-CM | POA: Diagnosis not present

## 2022-12-24 DIAGNOSIS — E119 Type 2 diabetes mellitus without complications: Secondary | ICD-10-CM | POA: Diagnosis not present

## 2022-12-24 DIAGNOSIS — R296 Repeated falls: Secondary | ICD-10-CM | POA: Diagnosis not present

## 2022-12-24 DIAGNOSIS — Z9181 History of falling: Secondary | ICD-10-CM | POA: Diagnosis not present

## 2022-12-24 DIAGNOSIS — E559 Vitamin D deficiency, unspecified: Secondary | ICD-10-CM | POA: Diagnosis not present

## 2022-12-24 DIAGNOSIS — H9190 Unspecified hearing loss, unspecified ear: Secondary | ICD-10-CM | POA: Diagnosis not present

## 2022-12-24 DIAGNOSIS — I739 Peripheral vascular disease, unspecified: Secondary | ICD-10-CM | POA: Diagnosis not present

## 2022-12-24 DIAGNOSIS — R278 Other lack of coordination: Secondary | ICD-10-CM | POA: Diagnosis not present

## 2022-12-24 DIAGNOSIS — E7849 Other hyperlipidemia: Secondary | ICD-10-CM | POA: Diagnosis not present

## 2022-12-24 DIAGNOSIS — D518 Other vitamin B12 deficiency anemias: Secondary | ICD-10-CM | POA: Diagnosis not present

## 2022-12-25 DIAGNOSIS — E559 Vitamin D deficiency, unspecified: Secondary | ICD-10-CM | POA: Diagnosis not present

## 2022-12-25 DIAGNOSIS — I10 Essential (primary) hypertension: Secondary | ICD-10-CM | POA: Diagnosis not present

## 2022-12-25 DIAGNOSIS — R4189 Other symptoms and signs involving cognitive functions and awareness: Secondary | ICD-10-CM | POA: Diagnosis not present

## 2022-12-25 DIAGNOSIS — I517 Cardiomegaly: Secondary | ICD-10-CM | POA: Diagnosis not present

## 2022-12-25 DIAGNOSIS — G629 Polyneuropathy, unspecified: Secondary | ICD-10-CM | POA: Diagnosis not present

## 2022-12-25 DIAGNOSIS — I739 Peripheral vascular disease, unspecified: Secondary | ICD-10-CM | POA: Diagnosis not present

## 2022-12-25 DIAGNOSIS — J302 Other seasonal allergic rhinitis: Secondary | ICD-10-CM | POA: Diagnosis not present

## 2022-12-25 DIAGNOSIS — H9190 Unspecified hearing loss, unspecified ear: Secondary | ICD-10-CM | POA: Diagnosis not present

## 2022-12-25 DIAGNOSIS — E785 Hyperlipidemia, unspecified: Secondary | ICD-10-CM | POA: Diagnosis not present

## 2022-12-26 DIAGNOSIS — M6281 Muscle weakness (generalized): Secondary | ICD-10-CM | POA: Diagnosis not present

## 2022-12-26 DIAGNOSIS — Z9181 History of falling: Secondary | ICD-10-CM | POA: Diagnosis not present

## 2022-12-26 DIAGNOSIS — R278 Other lack of coordination: Secondary | ICD-10-CM | POA: Diagnosis not present

## 2022-12-31 DIAGNOSIS — Z9181 History of falling: Secondary | ICD-10-CM | POA: Diagnosis not present

## 2022-12-31 DIAGNOSIS — M6281 Muscle weakness (generalized): Secondary | ICD-10-CM | POA: Diagnosis not present

## 2022-12-31 DIAGNOSIS — R278 Other lack of coordination: Secondary | ICD-10-CM | POA: Diagnosis not present

## 2023-01-01 DIAGNOSIS — R278 Other lack of coordination: Secondary | ICD-10-CM | POA: Diagnosis not present

## 2023-01-01 DIAGNOSIS — I10 Essential (primary) hypertension: Secondary | ICD-10-CM | POA: Diagnosis not present

## 2023-01-01 DIAGNOSIS — M6281 Muscle weakness (generalized): Secondary | ICD-10-CM | POA: Diagnosis not present

## 2023-01-01 DIAGNOSIS — R296 Repeated falls: Secondary | ICD-10-CM | POA: Diagnosis not present

## 2023-01-02 DIAGNOSIS — M6281 Muscle weakness (generalized): Secondary | ICD-10-CM | POA: Diagnosis not present

## 2023-01-02 DIAGNOSIS — R278 Other lack of coordination: Secondary | ICD-10-CM | POA: Diagnosis not present

## 2023-01-02 DIAGNOSIS — Z9181 History of falling: Secondary | ICD-10-CM | POA: Diagnosis not present

## 2023-01-03 DIAGNOSIS — F03C2 Unspecified dementia, severe, with psychotic disturbance: Secondary | ICD-10-CM | POA: Diagnosis not present

## 2023-01-03 DIAGNOSIS — F5101 Primary insomnia: Secondary | ICD-10-CM | POA: Diagnosis not present

## 2023-01-03 DIAGNOSIS — R443 Hallucinations, unspecified: Secondary | ICD-10-CM | POA: Diagnosis not present

## 2023-01-03 DIAGNOSIS — F419 Anxiety disorder, unspecified: Secondary | ICD-10-CM | POA: Diagnosis not present

## 2023-01-07 DIAGNOSIS — M6281 Muscle weakness (generalized): Secondary | ICD-10-CM | POA: Diagnosis not present

## 2023-01-07 DIAGNOSIS — Z9181 History of falling: Secondary | ICD-10-CM | POA: Diagnosis not present

## 2023-01-07 DIAGNOSIS — R278 Other lack of coordination: Secondary | ICD-10-CM | POA: Diagnosis not present

## 2023-01-07 DIAGNOSIS — I10 Essential (primary) hypertension: Secondary | ICD-10-CM | POA: Diagnosis not present

## 2023-01-09 DIAGNOSIS — Z9181 History of falling: Secondary | ICD-10-CM | POA: Diagnosis not present

## 2023-01-09 DIAGNOSIS — R278 Other lack of coordination: Secondary | ICD-10-CM | POA: Diagnosis not present

## 2023-01-09 DIAGNOSIS — M6281 Muscle weakness (generalized): Secondary | ICD-10-CM | POA: Diagnosis not present

## 2023-01-10 DIAGNOSIS — I10 Essential (primary) hypertension: Secondary | ICD-10-CM | POA: Diagnosis not present

## 2023-01-10 DIAGNOSIS — M6281 Muscle weakness (generalized): Secondary | ICD-10-CM | POA: Diagnosis not present

## 2023-01-10 DIAGNOSIS — R296 Repeated falls: Secondary | ICD-10-CM | POA: Diagnosis not present

## 2023-01-10 DIAGNOSIS — R278 Other lack of coordination: Secondary | ICD-10-CM | POA: Diagnosis not present

## 2023-01-14 DIAGNOSIS — R296 Repeated falls: Secondary | ICD-10-CM | POA: Diagnosis not present

## 2023-01-14 DIAGNOSIS — I10 Essential (primary) hypertension: Secondary | ICD-10-CM | POA: Diagnosis not present

## 2023-01-14 DIAGNOSIS — Z9181 History of falling: Secondary | ICD-10-CM | POA: Diagnosis not present

## 2023-01-14 DIAGNOSIS — M6281 Muscle weakness (generalized): Secondary | ICD-10-CM | POA: Diagnosis not present

## 2023-01-14 DIAGNOSIS — R278 Other lack of coordination: Secondary | ICD-10-CM | POA: Diagnosis not present

## 2023-01-16 DIAGNOSIS — Z9181 History of falling: Secondary | ICD-10-CM | POA: Diagnosis not present

## 2023-01-16 DIAGNOSIS — R278 Other lack of coordination: Secondary | ICD-10-CM | POA: Diagnosis not present

## 2023-01-16 DIAGNOSIS — M6281 Muscle weakness (generalized): Secondary | ICD-10-CM | POA: Diagnosis not present

## 2023-01-18 DIAGNOSIS — R011 Cardiac murmur, unspecified: Secondary | ICD-10-CM | POA: Diagnosis not present

## 2023-01-21 DIAGNOSIS — H9190 Unspecified hearing loss, unspecified ear: Secondary | ICD-10-CM | POA: Diagnosis not present

## 2023-01-21 DIAGNOSIS — I70223 Atherosclerosis of native arteries of extremities with rest pain, bilateral legs: Secondary | ICD-10-CM | POA: Diagnosis not present

## 2023-01-21 DIAGNOSIS — I10 Essential (primary) hypertension: Secondary | ICD-10-CM | POA: Diagnosis not present

## 2023-01-21 DIAGNOSIS — D518 Other vitamin B12 deficiency anemias: Secondary | ICD-10-CM | POA: Diagnosis not present

## 2023-01-21 DIAGNOSIS — E119 Type 2 diabetes mellitus without complications: Secondary | ICD-10-CM | POA: Diagnosis not present

## 2023-01-21 DIAGNOSIS — F419 Anxiety disorder, unspecified: Secondary | ICD-10-CM | POA: Diagnosis not present

## 2023-01-21 DIAGNOSIS — I739 Peripheral vascular disease, unspecified: Secondary | ICD-10-CM | POA: Diagnosis not present

## 2023-01-21 DIAGNOSIS — E559 Vitamin D deficiency, unspecified: Secondary | ICD-10-CM | POA: Diagnosis not present

## 2023-01-21 DIAGNOSIS — E038 Other specified hypothyroidism: Secondary | ICD-10-CM | POA: Diagnosis not present

## 2023-01-21 DIAGNOSIS — E7849 Other hyperlipidemia: Secondary | ICD-10-CM | POA: Diagnosis not present

## 2023-01-22 DIAGNOSIS — R4189 Other symptoms and signs involving cognitive functions and awareness: Secondary | ICD-10-CM | POA: Diagnosis not present

## 2023-01-22 DIAGNOSIS — J3489 Other specified disorders of nose and nasal sinuses: Secondary | ICD-10-CM | POA: Diagnosis not present

## 2023-01-22 DIAGNOSIS — E785 Hyperlipidemia, unspecified: Secondary | ICD-10-CM | POA: Diagnosis not present

## 2023-01-22 DIAGNOSIS — R051 Acute cough: Secondary | ICD-10-CM | POA: Diagnosis not present

## 2023-01-22 DIAGNOSIS — F419 Anxiety disorder, unspecified: Secondary | ICD-10-CM | POA: Diagnosis not present

## 2023-01-22 DIAGNOSIS — I739 Peripheral vascular disease, unspecified: Secondary | ICD-10-CM | POA: Diagnosis not present

## 2023-01-22 DIAGNOSIS — H9190 Unspecified hearing loss, unspecified ear: Secondary | ICD-10-CM | POA: Diagnosis not present

## 2023-01-22 DIAGNOSIS — I10 Essential (primary) hypertension: Secondary | ICD-10-CM | POA: Diagnosis not present

## 2023-01-22 DIAGNOSIS — R058 Other specified cough: Secondary | ICD-10-CM | POA: Diagnosis not present

## 2023-01-22 DIAGNOSIS — F0150 Vascular dementia without behavioral disturbance: Secondary | ICD-10-CM | POA: Diagnosis not present

## 2023-01-26 DIAGNOSIS — I10 Essential (primary) hypertension: Secondary | ICD-10-CM | POA: Diagnosis not present

## 2023-01-26 DIAGNOSIS — R296 Repeated falls: Secondary | ICD-10-CM | POA: Diagnosis not present

## 2023-01-26 DIAGNOSIS — R278 Other lack of coordination: Secondary | ICD-10-CM | POA: Diagnosis not present

## 2023-01-26 DIAGNOSIS — M6281 Muscle weakness (generalized): Secondary | ICD-10-CM | POA: Diagnosis not present

## 2023-01-28 DIAGNOSIS — M6281 Muscle weakness (generalized): Secondary | ICD-10-CM | POA: Diagnosis not present

## 2023-01-28 DIAGNOSIS — Z9181 History of falling: Secondary | ICD-10-CM | POA: Diagnosis not present

## 2023-01-28 DIAGNOSIS — R278 Other lack of coordination: Secondary | ICD-10-CM | POA: Diagnosis not present

## 2023-01-31 DIAGNOSIS — R296 Repeated falls: Secondary | ICD-10-CM | POA: Diagnosis not present

## 2023-01-31 DIAGNOSIS — I10 Essential (primary) hypertension: Secondary | ICD-10-CM | POA: Diagnosis not present

## 2023-01-31 DIAGNOSIS — R278 Other lack of coordination: Secondary | ICD-10-CM | POA: Diagnosis not present

## 2023-01-31 DIAGNOSIS — M6281 Muscle weakness (generalized): Secondary | ICD-10-CM | POA: Diagnosis not present

## 2023-02-04 DIAGNOSIS — Z9181 History of falling: Secondary | ICD-10-CM | POA: Diagnosis not present

## 2023-02-04 DIAGNOSIS — M6281 Muscle weakness (generalized): Secondary | ICD-10-CM | POA: Diagnosis not present

## 2023-02-04 DIAGNOSIS — R278 Other lack of coordination: Secondary | ICD-10-CM | POA: Diagnosis not present

## 2023-02-06 DIAGNOSIS — R278 Other lack of coordination: Secondary | ICD-10-CM | POA: Diagnosis not present

## 2023-02-06 DIAGNOSIS — I10 Essential (primary) hypertension: Secondary | ICD-10-CM | POA: Diagnosis not present

## 2023-02-06 DIAGNOSIS — Z9181 History of falling: Secondary | ICD-10-CM | POA: Diagnosis not present

## 2023-02-06 DIAGNOSIS — M6281 Muscle weakness (generalized): Secondary | ICD-10-CM | POA: Diagnosis not present

## 2023-02-07 DIAGNOSIS — M6281 Muscle weakness (generalized): Secondary | ICD-10-CM | POA: Diagnosis not present

## 2023-02-07 DIAGNOSIS — I10 Essential (primary) hypertension: Secondary | ICD-10-CM | POA: Diagnosis not present

## 2023-02-07 DIAGNOSIS — R278 Other lack of coordination: Secondary | ICD-10-CM | POA: Diagnosis not present

## 2023-02-07 DIAGNOSIS — R296 Repeated falls: Secondary | ICD-10-CM | POA: Diagnosis not present

## 2023-02-11 DIAGNOSIS — M6281 Muscle weakness (generalized): Secondary | ICD-10-CM | POA: Diagnosis not present

## 2023-02-11 DIAGNOSIS — Z9181 History of falling: Secondary | ICD-10-CM | POA: Diagnosis not present

## 2023-02-11 DIAGNOSIS — R278 Other lack of coordination: Secondary | ICD-10-CM | POA: Diagnosis not present

## 2023-02-13 DIAGNOSIS — M6281 Muscle weakness (generalized): Secondary | ICD-10-CM | POA: Diagnosis not present

## 2023-02-13 DIAGNOSIS — Z9181 History of falling: Secondary | ICD-10-CM | POA: Diagnosis not present

## 2023-02-13 DIAGNOSIS — R278 Other lack of coordination: Secondary | ICD-10-CM | POA: Diagnosis not present

## 2023-02-14 DIAGNOSIS — R278 Other lack of coordination: Secondary | ICD-10-CM | POA: Diagnosis not present

## 2023-02-14 DIAGNOSIS — M6281 Muscle weakness (generalized): Secondary | ICD-10-CM | POA: Diagnosis not present

## 2023-02-14 DIAGNOSIS — I10 Essential (primary) hypertension: Secondary | ICD-10-CM | POA: Diagnosis not present

## 2023-02-14 DIAGNOSIS — R296 Repeated falls: Secondary | ICD-10-CM | POA: Diagnosis not present

## 2023-02-18 DIAGNOSIS — M6281 Muscle weakness (generalized): Secondary | ICD-10-CM | POA: Diagnosis not present

## 2023-02-18 DIAGNOSIS — R278 Other lack of coordination: Secondary | ICD-10-CM | POA: Diagnosis not present

## 2023-02-18 DIAGNOSIS — Z9181 History of falling: Secondary | ICD-10-CM | POA: Diagnosis not present

## 2023-02-19 DIAGNOSIS — I739 Peripheral vascular disease, unspecified: Secondary | ICD-10-CM | POA: Diagnosis not present

## 2023-02-19 DIAGNOSIS — I10 Essential (primary) hypertension: Secondary | ICD-10-CM | POA: Diagnosis not present

## 2023-02-19 DIAGNOSIS — M6281 Muscle weakness (generalized): Secondary | ICD-10-CM | POA: Diagnosis not present

## 2023-02-19 DIAGNOSIS — G629 Polyneuropathy, unspecified: Secondary | ICD-10-CM | POA: Diagnosis not present

## 2023-02-19 DIAGNOSIS — F419 Anxiety disorder, unspecified: Secondary | ICD-10-CM | POA: Diagnosis not present

## 2023-02-19 DIAGNOSIS — R296 Repeated falls: Secondary | ICD-10-CM | POA: Diagnosis not present

## 2023-02-19 DIAGNOSIS — E559 Vitamin D deficiency, unspecified: Secondary | ICD-10-CM | POA: Diagnosis not present

## 2023-02-19 DIAGNOSIS — R4189 Other symptoms and signs involving cognitive functions and awareness: Secondary | ICD-10-CM | POA: Diagnosis not present

## 2023-02-19 DIAGNOSIS — H9190 Unspecified hearing loss, unspecified ear: Secondary | ICD-10-CM | POA: Diagnosis not present

## 2023-02-19 DIAGNOSIS — J302 Other seasonal allergic rhinitis: Secondary | ICD-10-CM | POA: Diagnosis not present

## 2023-02-19 DIAGNOSIS — E785 Hyperlipidemia, unspecified: Secondary | ICD-10-CM | POA: Diagnosis not present

## 2023-02-19 DIAGNOSIS — R278 Other lack of coordination: Secondary | ICD-10-CM | POA: Diagnosis not present

## 2023-02-20 DIAGNOSIS — Z9181 History of falling: Secondary | ICD-10-CM | POA: Diagnosis not present

## 2023-02-20 DIAGNOSIS — R278 Other lack of coordination: Secondary | ICD-10-CM | POA: Diagnosis not present

## 2023-02-20 DIAGNOSIS — M6281 Muscle weakness (generalized): Secondary | ICD-10-CM | POA: Diagnosis not present

## 2023-02-21 DIAGNOSIS — E038 Other specified hypothyroidism: Secondary | ICD-10-CM | POA: Diagnosis not present

## 2023-02-21 DIAGNOSIS — I10 Essential (primary) hypertension: Secondary | ICD-10-CM | POA: Diagnosis not present

## 2023-02-21 DIAGNOSIS — M6281 Muscle weakness (generalized): Secondary | ICD-10-CM | POA: Diagnosis not present

## 2023-02-21 DIAGNOSIS — E782 Mixed hyperlipidemia: Secondary | ICD-10-CM | POA: Diagnosis not present

## 2023-02-21 DIAGNOSIS — R296 Repeated falls: Secondary | ICD-10-CM | POA: Diagnosis not present

## 2023-02-21 DIAGNOSIS — Z79899 Other long term (current) drug therapy: Secondary | ICD-10-CM | POA: Diagnosis not present

## 2023-02-21 DIAGNOSIS — D518 Other vitamin B12 deficiency anemias: Secondary | ICD-10-CM | POA: Diagnosis not present

## 2023-02-21 DIAGNOSIS — E119 Type 2 diabetes mellitus without complications: Secondary | ICD-10-CM | POA: Diagnosis not present

## 2023-02-21 DIAGNOSIS — R278 Other lack of coordination: Secondary | ICD-10-CM | POA: Diagnosis not present

## 2023-02-24 DIAGNOSIS — Z9181 History of falling: Secondary | ICD-10-CM | POA: Diagnosis not present

## 2023-02-24 DIAGNOSIS — M6281 Muscle weakness (generalized): Secondary | ICD-10-CM | POA: Diagnosis not present

## 2023-02-24 DIAGNOSIS — R278 Other lack of coordination: Secondary | ICD-10-CM | POA: Diagnosis not present

## 2023-02-25 DIAGNOSIS — E119 Type 2 diabetes mellitus without complications: Secondary | ICD-10-CM | POA: Diagnosis not present

## 2023-02-25 DIAGNOSIS — I739 Peripheral vascular disease, unspecified: Secondary | ICD-10-CM | POA: Diagnosis not present

## 2023-02-25 DIAGNOSIS — E7849 Other hyperlipidemia: Secondary | ICD-10-CM | POA: Diagnosis not present

## 2023-02-25 DIAGNOSIS — E038 Other specified hypothyroidism: Secondary | ICD-10-CM | POA: Diagnosis not present

## 2023-02-25 DIAGNOSIS — E559 Vitamin D deficiency, unspecified: Secondary | ICD-10-CM | POA: Diagnosis not present

## 2023-02-25 DIAGNOSIS — R278 Other lack of coordination: Secondary | ICD-10-CM | POA: Diagnosis not present

## 2023-02-25 DIAGNOSIS — Z9181 History of falling: Secondary | ICD-10-CM | POA: Diagnosis not present

## 2023-02-25 DIAGNOSIS — I70223 Atherosclerosis of native arteries of extremities with rest pain, bilateral legs: Secondary | ICD-10-CM | POA: Diagnosis not present

## 2023-02-25 DIAGNOSIS — M6281 Muscle weakness (generalized): Secondary | ICD-10-CM | POA: Diagnosis not present

## 2023-02-25 DIAGNOSIS — I10 Essential (primary) hypertension: Secondary | ICD-10-CM | POA: Diagnosis not present

## 2023-02-25 DIAGNOSIS — D518 Other vitamin B12 deficiency anemias: Secondary | ICD-10-CM | POA: Diagnosis not present

## 2023-02-25 DIAGNOSIS — F419 Anxiety disorder, unspecified: Secondary | ICD-10-CM | POA: Diagnosis not present

## 2023-02-26 DIAGNOSIS — M6281 Muscle weakness (generalized): Secondary | ICD-10-CM | POA: Diagnosis not present

## 2023-02-26 DIAGNOSIS — I10 Essential (primary) hypertension: Secondary | ICD-10-CM | POA: Diagnosis not present

## 2023-02-26 DIAGNOSIS — R296 Repeated falls: Secondary | ICD-10-CM | POA: Diagnosis not present

## 2023-02-26 DIAGNOSIS — R278 Other lack of coordination: Secondary | ICD-10-CM | POA: Diagnosis not present

## 2023-02-28 DIAGNOSIS — I10 Essential (primary) hypertension: Secondary | ICD-10-CM | POA: Diagnosis not present

## 2023-02-28 DIAGNOSIS — R278 Other lack of coordination: Secondary | ICD-10-CM | POA: Diagnosis not present

## 2023-02-28 DIAGNOSIS — R296 Repeated falls: Secondary | ICD-10-CM | POA: Diagnosis not present

## 2023-02-28 DIAGNOSIS — M6281 Muscle weakness (generalized): Secondary | ICD-10-CM | POA: Diagnosis not present

## 2023-03-04 ENCOUNTER — Ambulatory Visit: Payer: Medicare HMO | Attending: Cardiology | Admitting: Cardiology

## 2023-03-04 ENCOUNTER — Encounter: Payer: Self-pay | Admitting: *Deleted

## 2023-03-04 VITALS — BP 145/85 | HR 54 | Ht 66.0 in | Wt 136.2 lb

## 2023-03-04 DIAGNOSIS — I10 Essential (primary) hypertension: Secondary | ICD-10-CM | POA: Diagnosis not present

## 2023-03-04 DIAGNOSIS — R296 Repeated falls: Secondary | ICD-10-CM | POA: Diagnosis not present

## 2023-03-04 DIAGNOSIS — D6869 Other thrombophilia: Secondary | ICD-10-CM

## 2023-03-04 DIAGNOSIS — R278 Other lack of coordination: Secondary | ICD-10-CM | POA: Diagnosis not present

## 2023-03-04 DIAGNOSIS — I480 Paroxysmal atrial fibrillation: Secondary | ICD-10-CM

## 2023-03-04 DIAGNOSIS — M6281 Muscle weakness (generalized): Secondary | ICD-10-CM | POA: Diagnosis not present

## 2023-03-04 NOTE — Progress Notes (Signed)
Clinical Summary Ms. Ernster is a 89 y.o.female seen today for follow up of the following medical problems.   1.LE edema - ongonig about 1.5 month - occasonal SOB, wheezing. Occasional SOb with lying flat. ` - BNP at recent ER visti 214.     03/2022 echo: LVEF 55-60%, no WMAs, indet diastolic fxn, elevated LA pressure, mild MR  - has not had a recurrent issue     2.Dementia       3. PAF - lopressor stopped during 07/02/22 admission due to low HRs.  - can have some palpitations, infrequent.  - no bleeding eliquis - has had some prior falls infrequently, last was 10/2022     Past Medical History:  Diagnosis Date   Arthritis    GERD (gastroesophageal reflux disease)    Hypertension    Prolactinoma (HCC)    Stroke (HCC)      No Known Allergies   Current Outpatient Medications  Medication Sig Dispense Refill   apixaban (ELIQUIS) 5 MG TABS tablet Take 1 tablet (5 mg total) by mouth 2 (two) times daily. 60 tablet 6   atorvastatin (LIPITOR) 10 MG tablet Take 1 tablet by mouth at bedtime.     Cholecalciferol (VITAMIN D3) 250 MCG (10000 UT) TABS Take 1 tablet by mouth daily.     docusate sodium (COLACE) 100 MG capsule Take 1 capsule (100 mg total) by mouth 2 (two) times daily. 60 capsule 2   lisinopril (ZESTRIL) 10 MG tablet Take 1 tablet (10 mg total) by mouth daily. 30 tablet 2   PARoxetine (PAXIL) 10 MG tablet Take 10 mg by mouth daily.     acetaminophen (TYLENOL) 500 MG tablet Take 1 tablet by mouth 3 (three) times daily as needed.     donepezil (ARICEPT) 5 MG tablet Take 5 mg by mouth.     HYDROcodone-acetaminophen (NORCO/VICODIN) 5-325 MG tablet Take 1 tablet by mouth 2 (two) times daily as needed for severe pain. (Patient not taking: Reported on 03/04/2023) 10 tablet 0   polyethylene glycol (MIRALAX / GLYCOLAX) 17 g packet Take 17 g by mouth daily as needed for mild constipation. (Patient not taking: Reported on 03/04/2023) 14 each 0   polyethylene glycol (MIRALAX)  17 g packet Take 17 g by mouth daily. (Patient not taking: Reported on 03/04/2023) 14 each 0   No current facility-administered medications for this visit.     Past Surgical History:  Procedure Laterality Date   ABDOMINAL HYSTERECTOMY  yrs ago   CATARACT EXTRACTION W/ INTRAOCULAR LENS  IMPLANT, BILATERAL  2008   both eyes done   ORIF WRIST FRACTURE Right 06/29/2022   Procedure: OPEN REDUCTION INTERNAL FIXATION (ORIF) WRIST FRACTURE;  Surgeon: Vickki Hearing, MD;  Location: [facility];  Service: Orthopedics;  Laterality: Right;   RECTOCELE REPAIR  yrs ago   right hip arthroplasty  2003   TONSILLECTOMY  age 89   TOTAL HIP ARTHROPLASTY  09/18/2011   Procedure: TOTAL HIP ARTHROPLASTY ANTERIOR APPROACH;  Surgeon: Shelda Pal;  Location: [facility];  Service: Orthopedics;  Laterality: Left;     No Known Allergies    No family history on file.   Social History Ms. Bacha reports that she quit smoking about 35 years ago. Her smoking use included cigarettes. She has a 7.50 pack-year smoking history. She has never used smokeless tobacco. Ms. Albarracin reports no history of alcohol use.   Review of Systems CONSTITUTIONAL: No weight loss, fever, chills, weakness or fatigue.  HEENT: Eyes: No visual loss, blurred vision, double vision or yellow sclerae.No hearing loss, sneezing, congestion, runny nose or sore throat.  SKIN: No rash or itching.  CARDIOVASCULAR: per hpi RESPIRATORY: No shortness of breath, cough or sputum.  GASTROINTESTINAL: No anorexia, nausea, vomiting or diarrhea. No abdominal pain or blood.  GENITOURINARY: No burning on urination, no polyuria NEUROLOGICAL: No headache, dizziness, syncope, paralysis, ataxia, numbness or tingling in the extremities. No change in bowel or bladder control.  MUSCULOSKELETAL: No muscle, back pain, joint pain or stiffness.  LYMPHATICS: No enlarged nodes. No history of splenectomy.  PSYCHIATRIC: No history of depression or anxiety.   ENDOCRINOLOGIC: No reports of sweating, cold or heat intolerance. No polyuria or polydipsia.  Marland Kitchen   Physical Examination Today's Vitals   03/04/23 1529 03/04/23 1555  BP: (!) 138/90 (!) 145/85  Pulse: (!) 54   SpO2: 99%   Weight: 136 lb 3.2 oz (61.8 kg)   Height: 5\' 6"  (1.676 m)    Body mass index is 21.98 kg/m.  Gen: resting comfortably, no acute distress HEENT: no scleral icterus, pupils equal round and reactive, no palptable cervical adenopathy,  CV: irreg, no m/rg, no jvd Resp: Clear to auscultation bilaterally GI: abdomen is soft, non-tender, non-distended, normal bowel sounds, no hepatosplenomegaly MSK: extremities are warm, no edema.  Skin: warm, no rash Neuro:  no focal deficits Psych: appropriate affect   Diagnostic Studies  03/2022 echo 1. Left ventricular ejection fraction, by estimation, is 55 to 60%. The  left ventricle has normal function. The left ventricle has no regional  wall motion abnormalities. Left ventricular diastolic parameters are  indeterminate. Elevated left atrial  pressure.   2. Right ventricular systolic function is normal. The right ventricular  size is normal. Tricuspid regurgitation signal is inadequate for assessing  PA pressure.   3. The mitral valve is abnormal. Mild mitral valve regurgitation. No  evidence of mitral stenosis.   4. The aortic valve is tricuspid. Aortic valve regurgitation is not  visualized. No aortic stenosis is present.   5. The inferior vena cava is normal in size with greater than 50%  respiratory variability, suggesting right atrial pressure of 3 mmHg.      Assessment and Plan   1. PAF/acquired thrombophilia - self rate controlled, has not required av nodal agents - continue eliquis for stroke prevention.    2.HTN - above goal, call with home bp's in 1 week. Room to titrate lisinopril if needed      Antoine Poche, M.D.

## 2023-03-04 NOTE — Patient Instructions (Addendum)
Medication Instructions:   Continue all current medications.   Labwork:  none  Testing/Procedures:  none  Follow-Up:  6 months   Any Other Special Instructions Will Be Listed Below (If Applicable).  Please keep BP log x 1 week & notify office of the readings.    If you need a refill on your cardiac medications before your next appointment, please call your pharmacy.

## 2023-03-07 DIAGNOSIS — F419 Anxiety disorder, unspecified: Secondary | ICD-10-CM | POA: Diagnosis not present

## 2023-03-07 DIAGNOSIS — F03C2 Unspecified dementia, severe, with psychotic disturbance: Secondary | ICD-10-CM | POA: Diagnosis not present

## 2023-03-07 DIAGNOSIS — R443 Hallucinations, unspecified: Secondary | ICD-10-CM | POA: Diagnosis not present

## 2023-03-07 DIAGNOSIS — F5101 Primary insomnia: Secondary | ICD-10-CM | POA: Diagnosis not present

## 2023-03-09 DIAGNOSIS — I10 Essential (primary) hypertension: Secondary | ICD-10-CM | POA: Diagnosis not present

## 2023-03-11 DIAGNOSIS — I10 Essential (primary) hypertension: Secondary | ICD-10-CM | POA: Diagnosis not present

## 2023-03-11 DIAGNOSIS — R278 Other lack of coordination: Secondary | ICD-10-CM | POA: Diagnosis not present

## 2023-03-11 DIAGNOSIS — M6281 Muscle weakness (generalized): Secondary | ICD-10-CM | POA: Diagnosis not present

## 2023-03-11 DIAGNOSIS — R296 Repeated falls: Secondary | ICD-10-CM | POA: Diagnosis not present

## 2023-03-15 DIAGNOSIS — Z9181 History of falling: Secondary | ICD-10-CM | POA: Diagnosis not present

## 2023-03-15 DIAGNOSIS — R278 Other lack of coordination: Secondary | ICD-10-CM | POA: Diagnosis not present

## 2023-03-15 DIAGNOSIS — M6281 Muscle weakness (generalized): Secondary | ICD-10-CM | POA: Diagnosis not present

## 2023-03-17 DIAGNOSIS — R278 Other lack of coordination: Secondary | ICD-10-CM | POA: Diagnosis not present

## 2023-03-17 DIAGNOSIS — M6281 Muscle weakness (generalized): Secondary | ICD-10-CM | POA: Diagnosis not present

## 2023-03-17 DIAGNOSIS — Z9181 History of falling: Secondary | ICD-10-CM | POA: Diagnosis not present

## 2023-03-18 DIAGNOSIS — R278 Other lack of coordination: Secondary | ICD-10-CM | POA: Diagnosis not present

## 2023-03-18 DIAGNOSIS — R296 Repeated falls: Secondary | ICD-10-CM | POA: Diagnosis not present

## 2023-03-18 DIAGNOSIS — M6281 Muscle weakness (generalized): Secondary | ICD-10-CM | POA: Diagnosis not present

## 2023-03-18 DIAGNOSIS — I10 Essential (primary) hypertension: Secondary | ICD-10-CM | POA: Diagnosis not present

## 2023-03-18 DIAGNOSIS — Z9181 History of falling: Secondary | ICD-10-CM | POA: Diagnosis not present

## 2023-03-19 DIAGNOSIS — H9190 Unspecified hearing loss, unspecified ear: Secondary | ICD-10-CM | POA: Diagnosis not present

## 2023-03-19 DIAGNOSIS — F0150 Vascular dementia without behavioral disturbance: Secondary | ICD-10-CM | POA: Diagnosis not present

## 2023-03-19 DIAGNOSIS — I10 Essential (primary) hypertension: Secondary | ICD-10-CM | POA: Diagnosis not present

## 2023-03-19 DIAGNOSIS — R4189 Other symptoms and signs involving cognitive functions and awareness: Secondary | ICD-10-CM | POA: Diagnosis not present

## 2023-03-19 DIAGNOSIS — I517 Cardiomegaly: Secondary | ICD-10-CM | POA: Diagnosis not present

## 2023-03-19 DIAGNOSIS — G629 Polyneuropathy, unspecified: Secondary | ICD-10-CM | POA: Diagnosis not present

## 2023-03-19 DIAGNOSIS — E785 Hyperlipidemia, unspecified: Secondary | ICD-10-CM | POA: Diagnosis not present

## 2023-03-19 DIAGNOSIS — F419 Anxiety disorder, unspecified: Secondary | ICD-10-CM | POA: Diagnosis not present

## 2023-03-19 DIAGNOSIS — I739 Peripheral vascular disease, unspecified: Secondary | ICD-10-CM | POA: Diagnosis not present

## 2023-03-20 DIAGNOSIS — E7849 Other hyperlipidemia: Secondary | ICD-10-CM | POA: Diagnosis not present

## 2023-03-20 DIAGNOSIS — E559 Vitamin D deficiency, unspecified: Secondary | ICD-10-CM | POA: Diagnosis not present

## 2023-03-20 DIAGNOSIS — I70223 Atherosclerosis of native arteries of extremities with rest pain, bilateral legs: Secondary | ICD-10-CM | POA: Diagnosis not present

## 2023-03-20 DIAGNOSIS — E119 Type 2 diabetes mellitus without complications: Secondary | ICD-10-CM | POA: Diagnosis not present

## 2023-03-20 DIAGNOSIS — E038 Other specified hypothyroidism: Secondary | ICD-10-CM | POA: Diagnosis not present

## 2023-03-20 DIAGNOSIS — I739 Peripheral vascular disease, unspecified: Secondary | ICD-10-CM | POA: Diagnosis not present

## 2023-03-20 DIAGNOSIS — F419 Anxiety disorder, unspecified: Secondary | ICD-10-CM | POA: Diagnosis not present

## 2023-03-20 DIAGNOSIS — D518 Other vitamin B12 deficiency anemias: Secondary | ICD-10-CM | POA: Diagnosis not present

## 2023-03-20 DIAGNOSIS — I10 Essential (primary) hypertension: Secondary | ICD-10-CM | POA: Diagnosis not present

## 2023-03-21 DIAGNOSIS — Z9181 History of falling: Secondary | ICD-10-CM | POA: Diagnosis not present

## 2023-03-21 DIAGNOSIS — M6281 Muscle weakness (generalized): Secondary | ICD-10-CM | POA: Diagnosis not present

## 2023-03-21 DIAGNOSIS — R278 Other lack of coordination: Secondary | ICD-10-CM | POA: Diagnosis not present

## 2023-03-25 DIAGNOSIS — M6281 Muscle weakness (generalized): Secondary | ICD-10-CM | POA: Diagnosis not present

## 2023-03-25 DIAGNOSIS — I10 Essential (primary) hypertension: Secondary | ICD-10-CM | POA: Diagnosis not present

## 2023-03-25 DIAGNOSIS — R296 Repeated falls: Secondary | ICD-10-CM | POA: Diagnosis not present

## 2023-03-25 DIAGNOSIS — R278 Other lack of coordination: Secondary | ICD-10-CM | POA: Diagnosis not present

## 2023-03-25 DIAGNOSIS — Z9181 History of falling: Secondary | ICD-10-CM | POA: Diagnosis not present

## 2023-03-27 DIAGNOSIS — M6281 Muscle weakness (generalized): Secondary | ICD-10-CM | POA: Diagnosis not present

## 2023-03-27 DIAGNOSIS — Z9181 History of falling: Secondary | ICD-10-CM | POA: Diagnosis not present

## 2023-03-27 DIAGNOSIS — R278 Other lack of coordination: Secondary | ICD-10-CM | POA: Diagnosis not present

## 2023-03-28 DIAGNOSIS — I10 Essential (primary) hypertension: Secondary | ICD-10-CM | POA: Diagnosis not present

## 2023-03-28 DIAGNOSIS — R296 Repeated falls: Secondary | ICD-10-CM | POA: Diagnosis not present

## 2023-03-28 DIAGNOSIS — M6281 Muscle weakness (generalized): Secondary | ICD-10-CM | POA: Diagnosis not present

## 2023-03-28 DIAGNOSIS — R278 Other lack of coordination: Secondary | ICD-10-CM | POA: Diagnosis not present

## 2023-04-01 DIAGNOSIS — R278 Other lack of coordination: Secondary | ICD-10-CM | POA: Diagnosis not present

## 2023-04-01 DIAGNOSIS — Z9181 History of falling: Secondary | ICD-10-CM | POA: Diagnosis not present

## 2023-04-01 DIAGNOSIS — M6281 Muscle weakness (generalized): Secondary | ICD-10-CM | POA: Diagnosis not present

## 2023-04-02 DIAGNOSIS — R296 Repeated falls: Secondary | ICD-10-CM | POA: Diagnosis not present

## 2023-04-02 DIAGNOSIS — R278 Other lack of coordination: Secondary | ICD-10-CM | POA: Diagnosis not present

## 2023-04-02 DIAGNOSIS — I10 Essential (primary) hypertension: Secondary | ICD-10-CM | POA: Diagnosis not present

## 2023-04-02 DIAGNOSIS — M6281 Muscle weakness (generalized): Secondary | ICD-10-CM | POA: Diagnosis not present

## 2023-04-03 DIAGNOSIS — R278 Other lack of coordination: Secondary | ICD-10-CM | POA: Diagnosis not present

## 2023-04-03 DIAGNOSIS — R32 Unspecified urinary incontinence: Secondary | ICD-10-CM | POA: Diagnosis not present

## 2023-04-03 DIAGNOSIS — R159 Full incontinence of feces: Secondary | ICD-10-CM | POA: Diagnosis not present

## 2023-04-03 DIAGNOSIS — R4182 Altered mental status, unspecified: Secondary | ICD-10-CM | POA: Diagnosis not present

## 2023-04-03 DIAGNOSIS — M6281 Muscle weakness (generalized): Secondary | ICD-10-CM | POA: Diagnosis not present

## 2023-04-03 DIAGNOSIS — Z9181 History of falling: Secondary | ICD-10-CM | POA: Diagnosis not present

## 2023-04-04 DIAGNOSIS — F5101 Primary insomnia: Secondary | ICD-10-CM | POA: Diagnosis not present

## 2023-04-04 DIAGNOSIS — R296 Repeated falls: Secondary | ICD-10-CM | POA: Diagnosis not present

## 2023-04-04 DIAGNOSIS — M6281 Muscle weakness (generalized): Secondary | ICD-10-CM | POA: Diagnosis not present

## 2023-04-04 DIAGNOSIS — R278 Other lack of coordination: Secondary | ICD-10-CM | POA: Diagnosis not present

## 2023-04-04 DIAGNOSIS — I10 Essential (primary) hypertension: Secondary | ICD-10-CM | POA: Diagnosis not present

## 2023-04-04 DIAGNOSIS — F419 Anxiety disorder, unspecified: Secondary | ICD-10-CM | POA: Diagnosis not present

## 2023-04-04 DIAGNOSIS — R443 Hallucinations, unspecified: Secondary | ICD-10-CM | POA: Diagnosis not present

## 2023-04-04 DIAGNOSIS — F03C2 Unspecified dementia, severe, with psychotic disturbance: Secondary | ICD-10-CM | POA: Diagnosis not present

## 2023-04-06 DIAGNOSIS — N3000 Acute cystitis without hematuria: Secondary | ICD-10-CM | POA: Diagnosis not present

## 2023-04-08 DIAGNOSIS — R278 Other lack of coordination: Secondary | ICD-10-CM | POA: Diagnosis not present

## 2023-04-08 DIAGNOSIS — M6281 Muscle weakness (generalized): Secondary | ICD-10-CM | POA: Diagnosis not present

## 2023-04-08 DIAGNOSIS — R296 Repeated falls: Secondary | ICD-10-CM | POA: Diagnosis not present

## 2023-04-08 DIAGNOSIS — I10 Essential (primary) hypertension: Secondary | ICD-10-CM | POA: Diagnosis not present

## 2023-04-09 DIAGNOSIS — R531 Weakness: Secondary | ICD-10-CM | POA: Diagnosis not present

## 2023-04-09 DIAGNOSIS — S06320A Contusion and laceration of left cerebrum without loss of consciousness, initial encounter: Secondary | ICD-10-CM | POA: Diagnosis not present

## 2023-04-09 DIAGNOSIS — M84459D Pathological fracture, hip, unspecified, subsequent encounter for fracture with routine healing: Secondary | ICD-10-CM | POA: Diagnosis not present

## 2023-04-09 DIAGNOSIS — W0110XA Fall on same level from slipping, tripping and stumbling with subsequent striking against unspecified object, initial encounter: Secondary | ICD-10-CM | POA: Diagnosis not present

## 2023-04-09 DIAGNOSIS — R296 Repeated falls: Secondary | ICD-10-CM | POA: Diagnosis not present

## 2023-04-09 DIAGNOSIS — K219 Gastro-esophageal reflux disease without esophagitis: Secondary | ICD-10-CM | POA: Diagnosis not present

## 2023-04-09 DIAGNOSIS — W19XXXA Unspecified fall, initial encounter: Secondary | ICD-10-CM | POA: Diagnosis not present

## 2023-04-09 DIAGNOSIS — Z043 Encounter for examination and observation following other accident: Secondary | ICD-10-CM | POA: Diagnosis not present

## 2023-04-09 DIAGNOSIS — Z96643 Presence of artificial hip joint, bilateral: Secondary | ICD-10-CM | POA: Diagnosis not present

## 2023-04-09 DIAGNOSIS — I4891 Unspecified atrial fibrillation: Secondary | ICD-10-CM | POA: Diagnosis not present

## 2023-04-09 DIAGNOSIS — I959 Hypotension, unspecified: Secondary | ICD-10-CM | POA: Diagnosis not present

## 2023-04-09 DIAGNOSIS — R000 Tachycardia, unspecified: Secondary | ICD-10-CM | POA: Diagnosis not present

## 2023-04-09 DIAGNOSIS — I10 Essential (primary) hypertension: Secondary | ICD-10-CM | POA: Diagnosis not present

## 2023-04-09 DIAGNOSIS — S0990XA Unspecified injury of head, initial encounter: Secondary | ICD-10-CM | POA: Diagnosis not present

## 2023-04-09 DIAGNOSIS — Z87891 Personal history of nicotine dependence: Secondary | ICD-10-CM | POA: Diagnosis not present

## 2023-04-09 DIAGNOSIS — J984 Other disorders of lung: Secondary | ICD-10-CM | POA: Diagnosis not present

## 2023-04-09 DIAGNOSIS — R9089 Other abnormal findings on diagnostic imaging of central nervous system: Secondary | ICD-10-CM | POA: Diagnosis not present

## 2023-04-09 DIAGNOSIS — E860 Dehydration: Secondary | ICD-10-CM | POA: Diagnosis not present

## 2023-04-09 DIAGNOSIS — R935 Abnormal findings on diagnostic imaging of other abdominal regions, including retroperitoneum: Secondary | ICD-10-CM | POA: Diagnosis not present

## 2023-04-09 DIAGNOSIS — Z743 Need for continuous supervision: Secondary | ICD-10-CM | POA: Diagnosis not present

## 2023-04-10 DIAGNOSIS — M6281 Muscle weakness (generalized): Secondary | ICD-10-CM | POA: Diagnosis not present

## 2023-04-10 DIAGNOSIS — I10 Essential (primary) hypertension: Secondary | ICD-10-CM | POA: Diagnosis not present

## 2023-04-10 DIAGNOSIS — R296 Repeated falls: Secondary | ICD-10-CM | POA: Diagnosis not present

## 2023-04-10 DIAGNOSIS — R278 Other lack of coordination: Secondary | ICD-10-CM | POA: Diagnosis not present

## 2023-04-11 DIAGNOSIS — M6281 Muscle weakness (generalized): Secondary | ICD-10-CM | POA: Diagnosis not present

## 2023-04-11 DIAGNOSIS — R278 Other lack of coordination: Secondary | ICD-10-CM | POA: Diagnosis not present

## 2023-04-11 DIAGNOSIS — I10 Essential (primary) hypertension: Secondary | ICD-10-CM | POA: Diagnosis not present

## 2023-04-11 DIAGNOSIS — R296 Repeated falls: Secondary | ICD-10-CM | POA: Diagnosis not present

## 2023-04-23 DIAGNOSIS — I10 Essential (primary) hypertension: Secondary | ICD-10-CM | POA: Diagnosis not present

## 2023-04-23 DIAGNOSIS — R278 Other lack of coordination: Secondary | ICD-10-CM | POA: Diagnosis not present

## 2023-04-23 DIAGNOSIS — R296 Repeated falls: Secondary | ICD-10-CM | POA: Diagnosis not present

## 2023-04-23 DIAGNOSIS — S0990XA Unspecified injury of head, initial encounter: Secondary | ICD-10-CM | POA: Diagnosis not present

## 2023-04-23 DIAGNOSIS — M6281 Muscle weakness (generalized): Secondary | ICD-10-CM | POA: Diagnosis not present

## 2023-04-23 DIAGNOSIS — F0150 Vascular dementia without behavioral disturbance: Secondary | ICD-10-CM | POA: Diagnosis not present

## 2023-04-24 DIAGNOSIS — E119 Type 2 diabetes mellitus without complications: Secondary | ICD-10-CM | POA: Diagnosis not present

## 2023-04-24 DIAGNOSIS — E7849 Other hyperlipidemia: Secondary | ICD-10-CM | POA: Diagnosis not present

## 2023-04-24 DIAGNOSIS — I739 Peripheral vascular disease, unspecified: Secondary | ICD-10-CM | POA: Diagnosis not present

## 2023-04-24 DIAGNOSIS — F419 Anxiety disorder, unspecified: Secondary | ICD-10-CM | POA: Diagnosis not present

## 2023-04-24 DIAGNOSIS — E559 Vitamin D deficiency, unspecified: Secondary | ICD-10-CM | POA: Diagnosis not present

## 2023-04-24 DIAGNOSIS — I10 Essential (primary) hypertension: Secondary | ICD-10-CM | POA: Diagnosis not present

## 2023-04-24 DIAGNOSIS — D518 Other vitamin B12 deficiency anemias: Secondary | ICD-10-CM | POA: Diagnosis not present

## 2023-04-24 DIAGNOSIS — R4182 Altered mental status, unspecified: Secondary | ICD-10-CM | POA: Diagnosis not present

## 2023-04-24 DIAGNOSIS — E038 Other specified hypothyroidism: Secondary | ICD-10-CM | POA: Diagnosis not present

## 2023-04-24 DIAGNOSIS — I70223 Atherosclerosis of native arteries of extremities with rest pain, bilateral legs: Secondary | ICD-10-CM | POA: Diagnosis not present

## 2023-04-25 DIAGNOSIS — I10 Essential (primary) hypertension: Secondary | ICD-10-CM | POA: Diagnosis not present

## 2023-04-25 DIAGNOSIS — R278 Other lack of coordination: Secondary | ICD-10-CM | POA: Diagnosis not present

## 2023-04-25 DIAGNOSIS — M6281 Muscle weakness (generalized): Secondary | ICD-10-CM | POA: Diagnosis not present

## 2023-04-25 DIAGNOSIS — R296 Repeated falls: Secondary | ICD-10-CM | POA: Diagnosis not present

## 2023-04-29 DIAGNOSIS — M6281 Muscle weakness (generalized): Secondary | ICD-10-CM | POA: Diagnosis not present

## 2023-04-29 DIAGNOSIS — R296 Repeated falls: Secondary | ICD-10-CM | POA: Diagnosis not present

## 2023-04-29 DIAGNOSIS — R278 Other lack of coordination: Secondary | ICD-10-CM | POA: Diagnosis not present

## 2023-04-29 DIAGNOSIS — I10 Essential (primary) hypertension: Secondary | ICD-10-CM | POA: Diagnosis not present

## 2023-04-30 DIAGNOSIS — Z79899 Other long term (current) drug therapy: Secondary | ICD-10-CM | POA: Diagnosis not present

## 2023-04-30 DIAGNOSIS — R0989 Other specified symptoms and signs involving the circulatory and respiratory systems: Secondary | ICD-10-CM | POA: Diagnosis not present

## 2023-05-02 DIAGNOSIS — F03C2 Unspecified dementia, severe, with psychotic disturbance: Secondary | ICD-10-CM | POA: Diagnosis not present

## 2023-05-02 DIAGNOSIS — R296 Repeated falls: Secondary | ICD-10-CM | POA: Diagnosis not present

## 2023-05-02 DIAGNOSIS — R443 Hallucinations, unspecified: Secondary | ICD-10-CM | POA: Diagnosis not present

## 2023-05-02 DIAGNOSIS — F419 Anxiety disorder, unspecified: Secondary | ICD-10-CM | POA: Diagnosis not present

## 2023-05-02 DIAGNOSIS — R278 Other lack of coordination: Secondary | ICD-10-CM | POA: Diagnosis not present

## 2023-05-02 DIAGNOSIS — I10 Essential (primary) hypertension: Secondary | ICD-10-CM | POA: Diagnosis not present

## 2023-05-02 DIAGNOSIS — M6281 Muscle weakness (generalized): Secondary | ICD-10-CM | POA: Diagnosis not present

## 2023-05-02 DIAGNOSIS — F5101 Primary insomnia: Secondary | ICD-10-CM | POA: Diagnosis not present

## 2023-05-07 DIAGNOSIS — M6281 Muscle weakness (generalized): Secondary | ICD-10-CM | POA: Diagnosis not present

## 2023-05-07 DIAGNOSIS — R296 Repeated falls: Secondary | ICD-10-CM | POA: Diagnosis not present

## 2023-05-07 DIAGNOSIS — R278 Other lack of coordination: Secondary | ICD-10-CM | POA: Diagnosis not present

## 2023-05-07 DIAGNOSIS — I10 Essential (primary) hypertension: Secondary | ICD-10-CM | POA: Diagnosis not present

## 2023-05-08 DIAGNOSIS — R32 Unspecified urinary incontinence: Secondary | ICD-10-CM | POA: Diagnosis not present

## 2023-05-08 DIAGNOSIS — R159 Full incontinence of feces: Secondary | ICD-10-CM | POA: Diagnosis not present

## 2023-05-09 DIAGNOSIS — I10 Essential (primary) hypertension: Secondary | ICD-10-CM | POA: Diagnosis not present

## 2023-05-21 DIAGNOSIS — E785 Hyperlipidemia, unspecified: Secondary | ICD-10-CM | POA: Diagnosis not present

## 2023-05-21 DIAGNOSIS — R4189 Other symptoms and signs involving cognitive functions and awareness: Secondary | ICD-10-CM | POA: Diagnosis not present

## 2023-05-21 DIAGNOSIS — G629 Polyneuropathy, unspecified: Secondary | ICD-10-CM | POA: Diagnosis not present

## 2023-05-21 DIAGNOSIS — H9190 Unspecified hearing loss, unspecified ear: Secondary | ICD-10-CM | POA: Diagnosis not present

## 2023-05-21 DIAGNOSIS — E559 Vitamin D deficiency, unspecified: Secondary | ICD-10-CM | POA: Diagnosis not present

## 2023-05-21 DIAGNOSIS — I10 Essential (primary) hypertension: Secondary | ICD-10-CM | POA: Diagnosis not present

## 2023-05-21 DIAGNOSIS — I739 Peripheral vascular disease, unspecified: Secondary | ICD-10-CM | POA: Diagnosis not present

## 2023-05-21 DIAGNOSIS — J302 Other seasonal allergic rhinitis: Secondary | ICD-10-CM | POA: Diagnosis not present

## 2023-05-30 DIAGNOSIS — J811 Chronic pulmonary edema: Secondary | ICD-10-CM | POA: Diagnosis not present

## 2023-05-31 DIAGNOSIS — J189 Pneumonia, unspecified organism: Secondary | ICD-10-CM | POA: Diagnosis not present

## 2023-05-31 DIAGNOSIS — Z79899 Other long term (current) drug therapy: Secondary | ICD-10-CM | POA: Diagnosis not present

## 2023-06-03 DIAGNOSIS — R296 Repeated falls: Secondary | ICD-10-CM | POA: Diagnosis not present

## 2023-06-03 DIAGNOSIS — J189 Pneumonia, unspecified organism: Secondary | ICD-10-CM | POA: Diagnosis not present

## 2023-06-04 DIAGNOSIS — G629 Polyneuropathy, unspecified: Secondary | ICD-10-CM | POA: Diagnosis not present

## 2023-06-04 DIAGNOSIS — E785 Hyperlipidemia, unspecified: Secondary | ICD-10-CM | POA: Diagnosis not present

## 2023-06-04 DIAGNOSIS — F419 Anxiety disorder, unspecified: Secondary | ICD-10-CM | POA: Diagnosis not present

## 2023-06-04 DIAGNOSIS — F0150 Vascular dementia without behavioral disturbance: Secondary | ICD-10-CM | POA: Diagnosis not present

## 2023-06-04 DIAGNOSIS — R4189 Other symptoms and signs involving cognitive functions and awareness: Secondary | ICD-10-CM | POA: Diagnosis not present

## 2023-06-04 DIAGNOSIS — H9190 Unspecified hearing loss, unspecified ear: Secondary | ICD-10-CM | POA: Diagnosis not present

## 2023-06-04 DIAGNOSIS — I10 Essential (primary) hypertension: Secondary | ICD-10-CM | POA: Diagnosis not present

## 2023-06-04 DIAGNOSIS — I739 Peripheral vascular disease, unspecified: Secondary | ICD-10-CM | POA: Diagnosis not present

## 2023-06-04 DIAGNOSIS — E559 Vitamin D deficiency, unspecified: Secondary | ICD-10-CM | POA: Diagnosis not present

## 2023-06-04 DIAGNOSIS — I517 Cardiomegaly: Secondary | ICD-10-CM | POA: Diagnosis not present

## 2023-06-04 DIAGNOSIS — J302 Other seasonal allergic rhinitis: Secondary | ICD-10-CM | POA: Diagnosis not present

## 2023-06-05 DIAGNOSIS — E119 Type 2 diabetes mellitus without complications: Secondary | ICD-10-CM | POA: Diagnosis not present

## 2023-06-05 DIAGNOSIS — E038 Other specified hypothyroidism: Secondary | ICD-10-CM | POA: Diagnosis not present

## 2023-06-05 DIAGNOSIS — Z79899 Other long term (current) drug therapy: Secondary | ICD-10-CM | POA: Diagnosis not present

## 2023-06-05 DIAGNOSIS — E782 Mixed hyperlipidemia: Secondary | ICD-10-CM | POA: Diagnosis not present

## 2023-06-05 DIAGNOSIS — I739 Peripheral vascular disease, unspecified: Secondary | ICD-10-CM | POA: Diagnosis not present

## 2023-06-05 DIAGNOSIS — E559 Vitamin D deficiency, unspecified: Secondary | ICD-10-CM | POA: Diagnosis not present

## 2023-06-05 DIAGNOSIS — I10 Essential (primary) hypertension: Secondary | ICD-10-CM | POA: Diagnosis not present

## 2023-06-05 DIAGNOSIS — E7849 Other hyperlipidemia: Secondary | ICD-10-CM | POA: Diagnosis not present

## 2023-06-05 DIAGNOSIS — I70223 Atherosclerosis of native arteries of extremities with rest pain, bilateral legs: Secondary | ICD-10-CM | POA: Diagnosis not present

## 2023-06-05 DIAGNOSIS — F419 Anxiety disorder, unspecified: Secondary | ICD-10-CM | POA: Diagnosis not present

## 2023-06-05 DIAGNOSIS — D518 Other vitamin B12 deficiency anemias: Secondary | ICD-10-CM | POA: Diagnosis not present

## 2023-06-06 DIAGNOSIS — R443 Hallucinations, unspecified: Secondary | ICD-10-CM | POA: Diagnosis not present

## 2023-06-06 DIAGNOSIS — F419 Anxiety disorder, unspecified: Secondary | ICD-10-CM | POA: Diagnosis not present

## 2023-06-06 DIAGNOSIS — F03C2 Unspecified dementia, severe, with psychotic disturbance: Secondary | ICD-10-CM | POA: Diagnosis not present

## 2023-06-06 DIAGNOSIS — F5101 Primary insomnia: Secondary | ICD-10-CM | POA: Diagnosis not present

## 2023-06-09 DIAGNOSIS — I10 Essential (primary) hypertension: Secondary | ICD-10-CM | POA: Diagnosis not present

## 2023-06-11 DIAGNOSIS — R2232 Localized swelling, mass and lump, left upper limb: Secondary | ICD-10-CM | POA: Diagnosis not present

## 2023-06-13 DIAGNOSIS — R590 Localized enlarged lymph nodes: Secondary | ICD-10-CM | POA: Diagnosis not present

## 2023-06-18 DIAGNOSIS — R221 Localized swelling, mass and lump, neck: Secondary | ICD-10-CM | POA: Diagnosis not present

## 2023-06-18 DIAGNOSIS — E042 Nontoxic multinodular goiter: Secondary | ICD-10-CM | POA: Diagnosis not present

## 2023-06-19 ENCOUNTER — Encounter (HOSPITAL_COMMUNITY): Payer: Self-pay

## 2023-06-19 ENCOUNTER — Emergency Department (HOSPITAL_COMMUNITY): Payer: Medicare HMO

## 2023-06-19 ENCOUNTER — Emergency Department (HOSPITAL_COMMUNITY)
Admission: EM | Admit: 2023-06-19 | Discharge: 2023-06-19 | Disposition: A | Discharge status: Home / Self Care | Payer: Medicare HMO

## 2023-06-19 DIAGNOSIS — S0992XA Unspecified injury of nose, initial encounter: Secondary | ICD-10-CM | POA: Diagnosis present

## 2023-06-19 DIAGNOSIS — W19XXXA Unspecified fall, initial encounter: Secondary | ICD-10-CM

## 2023-06-19 DIAGNOSIS — Z79899 Other long term (current) drug therapy: Secondary | ICD-10-CM | POA: Insufficient documentation

## 2023-06-19 DIAGNOSIS — S0121XA Laceration without foreign body of nose, initial encounter: Secondary | ICD-10-CM | POA: Diagnosis not present

## 2023-06-19 DIAGNOSIS — F0390 Unspecified dementia without behavioral disturbance: Secondary | ICD-10-CM | POA: Insufficient documentation

## 2023-06-19 DIAGNOSIS — Z8673 Personal history of transient ischemic attack (TIA), and cerebral infarction without residual deficits: Secondary | ICD-10-CM | POA: Diagnosis not present

## 2023-06-19 DIAGNOSIS — Z23 Encounter for immunization: Secondary | ICD-10-CM | POA: Diagnosis not present

## 2023-06-19 DIAGNOSIS — W010XXA Fall on same level from slipping, tripping and stumbling without subsequent striking against object, initial encounter: Secondary | ICD-10-CM | POA: Insufficient documentation

## 2023-06-19 DIAGNOSIS — J849 Interstitial pulmonary disease, unspecified: Secondary | ICD-10-CM | POA: Diagnosis not present

## 2023-06-19 DIAGNOSIS — I10 Essential (primary) hypertension: Secondary | ICD-10-CM | POA: Diagnosis not present

## 2023-06-19 DIAGNOSIS — S0993XA Unspecified injury of face, initial encounter: Secondary | ICD-10-CM | POA: Diagnosis not present

## 2023-06-19 DIAGNOSIS — M25559 Pain in unspecified hip: Secondary | ICD-10-CM | POA: Diagnosis not present

## 2023-06-19 DIAGNOSIS — S199XXA Unspecified injury of neck, initial encounter: Secondary | ICD-10-CM | POA: Diagnosis not present

## 2023-06-19 DIAGNOSIS — S022XXA Fracture of nasal bones, initial encounter for closed fracture: Secondary | ICD-10-CM | POA: Diagnosis not present

## 2023-06-19 DIAGNOSIS — S0990XA Unspecified injury of head, initial encounter: Secondary | ICD-10-CM | POA: Diagnosis not present

## 2023-06-19 DIAGNOSIS — Z7901 Long term (current) use of anticoagulants: Secondary | ICD-10-CM | POA: Insufficient documentation

## 2023-06-19 DIAGNOSIS — R5381 Other malaise: Secondary | ICD-10-CM | POA: Diagnosis not present

## 2023-06-19 DIAGNOSIS — Z743 Need for continuous supervision: Secondary | ICD-10-CM | POA: Diagnosis not present

## 2023-06-19 DIAGNOSIS — Z043 Encounter for examination and observation following other accident: Secondary | ICD-10-CM | POA: Diagnosis not present

## 2023-06-19 DIAGNOSIS — Z96643 Presence of artificial hip joint, bilateral: Secondary | ICD-10-CM | POA: Diagnosis not present

## 2023-06-19 DIAGNOSIS — R531 Weakness: Secondary | ICD-10-CM | POA: Diagnosis not present

## 2023-06-19 DIAGNOSIS — Z7401 Bed confinement status: Secondary | ICD-10-CM | POA: Diagnosis not present

## 2023-06-19 DIAGNOSIS — S022XXB Fracture of nasal bones, initial encounter for open fracture: Secondary | ICD-10-CM | POA: Diagnosis not present

## 2023-06-19 HISTORY — DX: Vascular dementia, unspecified severity, without behavioral disturbance, psychotic disturbance, mood disturbance, and anxiety: F01.50

## 2023-06-19 MED ORDER — CEPHALEXIN 500 MG PO CAPS
500.0000 mg | ORAL_CAPSULE | Freq: Once | ORAL | Status: AC
  Administered 2023-06-19: 500 mg via ORAL
  Filled 2023-06-19: qty 1

## 2023-06-19 MED ORDER — TETANUS-DIPHTH-ACELL PERTUSSIS 5-2.5-18.5 LF-MCG/0.5 IM SUSY
0.5000 mL | PREFILLED_SYRINGE | Freq: Once | INTRAMUSCULAR | Status: AC
  Administered 2023-06-19: 0.5 mL via INTRAMUSCULAR
  Filled 2023-06-19: qty 0.5

## 2023-06-19 MED ORDER — CEPHALEXIN 500 MG PO CAPS: 500.0000 mg | ORAL_CAPSULE | Freq: Three times a day (TID) | ORAL | 0 refills | Status: DC

## 2023-06-19 NOTE — Discharge Instructions (Signed)
Ms Shrewsbury's work-up in the ER today revealed that she does have a nose.  She also has an overlying wound in this area that we have closed with glue.  This should come off on its own and about a week.  Please advise the patient to not pick at it and let it fall off on its own.  I have given her a prescription for Keflex given this is an open wound with an underlying fracture.  Please ensure she takes it as prescribed every day.  We have given her the first dose in the emergency department this evening.  I have also given you a referral to an ear nose and throat doctor with a number to call to schedule an appointment.  Please call at your earliest convenience to schedule this follow-up.  I recommend close PCP follow-up as well.  Return if development of any new or worsening symptoms.

## 2023-06-19 NOTE — ED Notes (Addendum)
Pt found to be soaked in urine, but anytime staff get close the Pt starts yelling gibberish about money.  Pt wont allow staff to remove wet blankets.

## 2023-06-19 NOTE — ED Notes (Signed)
Cleaned of  incontinence

## 2023-06-19 NOTE — ED Provider Notes (Signed)
[place] EMERGENCY DEPARTMENT AT [facility] Provider Note   CSN: [id number] Arrival date & time: 06/19/23  1254     History  Chief Complaint  Patient presents with   Christie Nelson is a 89 y.o. female.  Patient brought in from [facility] in [place] with history of dementia, afib on elliquis presents today with complaints of fall. According to EMS, patient tripped and fell coming out of the bathroom and hit her head and face on the ground. She did not loose consciousness. She is on Eliquis. Reportedly was initially complaining of a headache, however upon my evaluation patient is without complaint.  Attempted to contact patient's facility for collateral information, however upon multiple attempts I have received no answer and the voicemail box is full.   Level 5 caveat --- dementia     The history is provided by the patient. No language interpreter was used.  Fall       Home Medications Prior to Admission medications   Medication Sig Start Date End Date Taking? Authorizing Provider  acetaminophen (TYLENOL) 500 MG tablet Take 1 tablet by mouth 3 (three) times daily as needed. 02/06/22   [provider]  apixaban (ELIQUIS) 5 MG TABS tablet Take 1 tablet (5 mg total) by mouth 2 (two) times daily. 03/20/22   Antoine Poche, MD  atorvastatin (LIPITOR) 10 MG tablet Take 1 tablet by mouth at bedtime. 02/06/22   [provider]  Cholecalciferol (VITAMIN D3) 250 MCG (10000 UT) TABS Take 1 tablet by mouth daily.    [provider]  docusate sodium (COLACE) 100 MG capsule Take 1 capsule (100 mg total) by mouth 2 (two) times daily. 07/03/22 07/03/23  Rolly Salter, MD  donepezil (ARICEPT) 5 MG tablet Take 5 mg by mouth.    [provider]  HYDROcodone-acetaminophen (NORCO/VICODIN) 5-325 MG tablet Take 1 tablet by mouth 2 (two) times daily as needed for severe pain. Patient not taking: Reported on 03/04/2023 07/03/22 07/03/23   Rolly Salter, MD  lisinopril (ZESTRIL) 10 MG tablet Take 1 tablet (10 mg total) by mouth daily. 10/20/22   Clark, Meghan R, PA-C  PARoxetine (PAXIL) 10 MG tablet Take 10 mg by mouth daily. 01/09/22   [provider]  polyethylene glycol (MIRALAX / GLYCOLAX) 17 g packet Take 17 g by mouth daily as needed for mild constipation. Patient not taking: Reported on 03/04/2023 07/03/22   Rolly Salter, MD  polyethylene glycol (MIRALAX) 17 g packet Take 17 g by mouth daily. Patient not taking: Reported on 03/04/2023 07/03/22   Rolly Salter, MD      Allergies    Patient has no known allergies.    Review of Systems   Review of Systems  All other systems reviewed and are negative.   Physical Exam Updated Vital Signs BP (!) 164/76   Pulse 66   Temp (!) 97 F (36.1 C) (Axillary)   Resp 14   SpO2 95%  Physical Exam Vitals and nursing note reviewed.  Constitutional:      General: She is not in acute distress.    Appearance: Normal appearance. She is normal weight. She is not ill-appearing, toxic-appearing or diaphoretic.  HENT:     Head: Normocephalic and atraumatic.     Nose:     Comments: 1 cm horizontal laceration noted to the bridge of the nose.  No active bleeding.  No bleeding within the nares.  No signs of septal hematoma.  Eyes:     Extraocular Movements: Extraocular movements intact.     Pupils: Pupils are equal, round, and reactive to light.  Neck:     Comments: In c collar Cardiovascular:     Rate and Rhythm: Normal rate and regular rhythm.     Heart sounds: Normal heart sounds.  Pulmonary:     Effort: Pulmonary effort is normal. No respiratory distress.  Chest:     Comments: No tenderness to palpation of the anterior chest wall.  No bruising or deformity. Abdominal:     General: Abdomen is flat.     Palpations: Abdomen is soft.     Tenderness: There is no abdominal tenderness.  Musculoskeletal:        General: Normal range of motion.     Cervical back: Normal  range of motion.     Comments: No focal bony tenderness, moving all extremities equally and without issue.  Skin:    General: Skin is warm and dry.  Neurological:     General: No focal deficit present.     Mental Status: She is alert. Mental status is at baseline.  Psychiatric:        Mood and Affect: Mood normal.        Behavior: Behavior normal.     ED Results / Procedures / Treatments   Labs (all labs ordered are listed, but only abnormal results are displayed) Labs Reviewed - No data to display  EKG None  Radiology CT Head Wo Contrast  Result Date: 06/19/2023 CLINICAL DATA:  Head trauma, minor (Age >= 65y); Facial trauma, blunt; Neck trauma (Age >= 65y) EXAM: CT HEAD WITHOUT CONTRAST CT MAXILLOFACIAL WITHOUT CONTRAST CT CERVICAL SPINE WITHOUT CONTRAST TECHNIQUE: Multidetector CT imaging of the head, cervical spine, and maxillofacial structures were performed using the standard protocol without intravenous contrast. Multiplanar CT image reconstructions of the cervical spine and maxillofacial structures were also generated. RADIATION DOSE REDUCTION: This exam was performed according to the departmental dose-optimization program which includes automated exposure control, adjustment of the mA and/or kV according to patient size and/or use of iterative reconstruction technique. COMPARISON:  CT head 04/09/23 FINDINGS: CT HEAD FINDINGS Brain: No evidence of acute infarction, hemorrhage, hydrocephalus, extra-axial collection or mass lesion/mass effect. Sequela of moderate chronic microvascular ischemic change. Age-appropriate generalized volume loss without lobar predominance. Vascular: No hyperdense vessel or unexpected calcification. Skull: Normal. Negative for fracture or focal lesion. Other: None. CT MAXILLOFACIAL FINDINGS Osseous: Mildly displaced fracture of the nasal bone on the right. Orbits: Negative. No traumatic or inflammatory finding. Sinuses: No middle ear or mastoid effusion.  Paranasal sinuses are notable for mild mucosal thickening in the posterior ethmoid air cells bilaterally. Bilateral lens replacement. Orbits are otherwise unremarkable. Soft tissues: Negative. CT CERVICAL SPINE FINDINGS Alignment: Trace retrolisthesis of C3 on C4 and C5 on C6. Grade 1 anterolisthesis of C4 on C5. Skull base and vertebrae: No acute fracture. No primary bone lesion or focal pathologic process. Soft tissues and spinal canal: No prevertebral fluid or swelling. No visible canal hematoma. Disc levels:  No evidence of high-grade spinal canal stenosis Upper chest: Negative. Other: None IMPRESSION: 1. No acute intracranial abnormality. 2. Mildly displaced fracture of the nasal bone on the right. 3. No acute fracture or traumatic malalignment of the cervical spine. Electronically Signed   By: Lorenza Cambridge M.D.   On: 06/19/2023 14:27   CT Cervical Spine Wo Contrast  Result Date: 06/19/2023 CLINICAL DATA:  Head trauma, minor (Age >= 65y); Facial trauma,  blunt; Neck trauma (Age >= 65y) EXAM: CT HEAD WITHOUT CONTRAST CT MAXILLOFACIAL WITHOUT CONTRAST CT CERVICAL SPINE WITHOUT CONTRAST TECHNIQUE: Multidetector CT imaging of the head, cervical spine, and maxillofacial structures were performed using the standard protocol without intravenous contrast. Multiplanar CT image reconstructions of the cervical spine and maxillofacial structures were also generated. RADIATION DOSE REDUCTION: This exam was performed according to the departmental dose-optimization program which includes automated exposure control, adjustment of the mA and/or kV according to patient size and/or use of iterative reconstruction technique. COMPARISON:  CT head 04/09/23 FINDINGS: CT HEAD FINDINGS Brain: No evidence of acute infarction, hemorrhage, hydrocephalus, extra-axial collection or mass lesion/mass effect. Sequela of moderate chronic microvascular ischemic change. Age-appropriate generalized volume loss without lobar predominance.  Vascular: No hyperdense vessel or unexpected calcification. Skull: Normal. Negative for fracture or focal lesion. Other: None. CT MAXILLOFACIAL FINDINGS Osseous: Mildly displaced fracture of the nasal bone on the right. Orbits: Negative. No traumatic or inflammatory finding. Sinuses: No middle ear or mastoid effusion. Paranasal sinuses are notable for mild mucosal thickening in the posterior ethmoid air cells bilaterally. Bilateral lens replacement. Orbits are otherwise unremarkable. Soft tissues: Negative. CT CERVICAL SPINE FINDINGS Alignment: Trace retrolisthesis of C3 on C4 and C5 on C6. Grade 1 anterolisthesis of C4 on C5. Skull base and vertebrae: No acute fracture. No primary bone lesion or focal pathologic process. Soft tissues and spinal canal: No prevertebral fluid or swelling. No visible canal hematoma. Disc levels:  No evidence of high-grade spinal canal stenosis Upper chest: Negative. Other: None IMPRESSION: 1. No acute intracranial abnormality. 2. Mildly displaced fracture of the nasal bone on the right. 3. No acute fracture or traumatic malalignment of the cervical spine. Electronically Signed   By: Lorenza Cambridge M.D.   On: 06/19/2023 14:27   CT Maxillofacial Wo Contrast  Result Date: 06/19/2023 CLINICAL DATA:  Head trauma, minor (Age >= 65y); Facial trauma, blunt; Neck trauma (Age >= 65y) EXAM: CT HEAD WITHOUT CONTRAST CT MAXILLOFACIAL WITHOUT CONTRAST CT CERVICAL SPINE WITHOUT CONTRAST TECHNIQUE: Multidetector CT imaging of the head, cervical spine, and maxillofacial structures were performed using the standard protocol without intravenous contrast. Multiplanar CT image reconstructions of the cervical spine and maxillofacial structures were also generated. RADIATION DOSE REDUCTION: This exam was performed according to the departmental dose-optimization program which includes automated exposure control, adjustment of the mA and/or kV according to patient size and/or use of iterative reconstruction  technique. COMPARISON:  CT head 04/09/23 FINDINGS: CT HEAD FINDINGS Brain: No evidence of acute infarction, hemorrhage, hydrocephalus, extra-axial collection or mass lesion/mass effect. Sequela of moderate chronic microvascular ischemic change. Age-appropriate generalized volume loss without lobar predominance. Vascular: No hyperdense vessel or unexpected calcification. Skull: Normal. Negative for fracture or focal lesion. Other: None. CT MAXILLOFACIAL FINDINGS Osseous: Mildly displaced fracture of the nasal bone on the right. Orbits: Negative. No traumatic or inflammatory finding. Sinuses: No middle ear or mastoid effusion. Paranasal sinuses are notable for mild mucosal thickening in the posterior ethmoid air cells bilaterally. Bilateral lens replacement. Orbits are otherwise unremarkable. Soft tissues: Negative. CT CERVICAL SPINE FINDINGS Alignment: Trace retrolisthesis of C3 on C4 and C5 on C6. Grade 1 anterolisthesis of C4 on C5. Skull base and vertebrae: No acute fracture. No primary bone lesion or focal pathologic process. Soft tissues and spinal canal: No prevertebral fluid or swelling. No visible canal hematoma. Disc levels:  No evidence of high-grade spinal canal stenosis Upper chest: Negative. Other: None IMPRESSION: 1. No acute intracranial abnormality. 2. Mildly displaced fracture of  the nasal bone on the right. 3. No acute fracture or traumatic malalignment of the cervical spine. Electronically Signed   By: Lorenza Cambridge M.D.   On: 06/19/2023 14:27    Procedures .Marland KitchenLaceration Repair  Date/Time: 06/19/2023 5:38 PM  Performed by: Silva Bandy, PA-C Authorized by: Silva Bandy, PA-C   Consent:    Consent obtained:  Emergent situation   Consent given by:  Patient   Risks, benefits, and alternatives were discussed: yes     Risks discussed:  Infection, pain, retained foreign body, tendon damage, vascular damage, poor cosmetic result, need for additional repair, nerve damage and poor wound  healing   Alternatives discussed:  No treatment, delayed treatment, observation and referral Universal protocol:    Procedure explained and questions answered to patient or proxy's satisfaction: yes     Imaging studies available: yes     Immediately prior to procedure, a time out was called: yes     Patient identity confirmed:  Verbally with patient Anesthesia:    Anesthesia method:  None Laceration details:    Location:  Face   Face location:  Nose   Length (cm):  2   Depth (mm):  1 Exploration:    Hemostasis achieved with:  Direct pressure   Imaging outcome: foreign body not noted     Wound exploration: wound explored through full range of motion and entire depth of wound visualized   Treatment:    Area cleansed with:  Soap and water   Amount of cleaning:  Standard   Irrigation solution:  Sterile saline   Irrigation volume:  500   Irrigation method:  Pressure wash Skin repair:    Repair method:  Tissue adhesive Approximation:    Approximation:  Loose Repair type:    Repair type:  Simple Post-procedure details:    Dressing:  Open (no dressing)   Procedure completion:  Tolerated well, no immediate complications     Medications Ordered in ED Medications - No data to display  ED Course/ Medical Decision Making/ A&P                                 Medical Decision Making Amount and/or Complexity of Data Reviewed Radiology: ordered.   This patient is a 89 y.o. female who presents to the ED for concern of mechanical fall on thinners, this involves an extensive number of treatment options, and is a complaint that carries with it a high risk of complications and morbidity. The emergent differential diagnosis prior to evaluation includes, but is not limited to,  trauma . This is not an exhaustive differential.   Past Medical History / Co-morbidities / Social History:  has a past medical history of Arthritis, GERD (gastroesophageal reflux disease), Hypertension, Prolactinoma  (HCC), Stroke (HCC), and Vascular dementia without behavioral disturbance, psychotic disturbance, mood disturbance, or anxiety (HCC).  Additional history: Chart reviewed. Pertinent results include: according to previous notes, appears patient is at her baseline mental status. Repeatedly called family and the facility with no answer.  Physical Exam: Physical exam performed. The pertinent findings include: 1 cm horizontal laceration to the bridge of the nose no active bleeding or deformity.  Presents in a c-collar.  No other signs of trauma or areas of tenderness.   Imaging Studies: I ordered imaging studies including CT head, cervical spine, max face, teaching chest and pelvis. I independently visualized and interpreted imaging which showed   1.  No acute intracranial abnormality. 2. Mildly displaced fracture of the nasal bone on the right. 3. No acute fracture or traumatic malalignment of the cervical spine.  DG: NAD  I agree with the radiologist interpretation.   Cardiac Monitoring:  The patient was maintained on a cardiac monitor.  My attending physician Dr. Maple Hudson viewed and interpreted the cardiac monitored which showed an underlying rhythm of: no STEMI. I agree with this interpretation.   Medications: I ordered medication including tdap for wound, keflex for open fracture. Reevaluation of the patient after these medicines showed that the patient stayed the same. I have reviewed the patients home medicines and have made adjustments as needed.   Disposition: After consideration of the diagnostic results and the patients response to treatment, I feel that emergency department workup does not suggest an emergent condition requiring admission or immediate intervention beyond what has been performed at this time. The plan is: Discharge with Keflex for an open nasal bone fracture.  There is no bone protruding from the wound.  Will give ENT referral as well.  Patient ambulatory per previous.   Multiple attempts made to call the facility and family members without getting in touch with anyone.  Chart reviewed, patient appears to be at her baseline mental status and is currently without any complaints. Evaluation and diagnostic testing in the emergency department does not suggest an emergent condition requiring admission or immediate intervention beyond what has been performed at this time.  Plan for discharge with close PCP follow-up.  Patient is understanding and amenable with plan, educated on red flag symptoms that would prompt immediate return.  Patient discharged in stable condition.   I discussed this case with my attending physician Dr. Maple Hudson who cosigned this note including patient's presenting symptoms, physical exam, and planned diagnostics and interventions. Attending physician stated agreement with plan or made changes to plan which were implemented.    Final Clinical Impression(s) / ED Diagnoses Final diagnoses:  Fall, initial encounter  Open fracture of nasal bone, initial encounter    Rx / DC Orders ED Discharge Orders          Ordered    cephALEXin (KEFLEX) 500 MG capsule  3 times daily        06/19/23 1748          An After Visit Summary was printed and given to the patient.     Silva Bandy, PA-C 06/19/23 1751    Coral Spikes, DO 06/20/23 [phone]

## 2023-06-19 NOTE — ED Triage Notes (Signed)
Pt bib ems from [facility] of [place]. Pt tripped and fell coming out of bathroom and hit head and face. Pt has cut to bridge of nose. C-collar placed by EMS. Pt is on Blood thinners. No LOC. Pt complaints of headache. No bruise brumps abrasions of lacerations noted to head.

## 2023-06-20 DIAGNOSIS — H9190 Unspecified hearing loss, unspecified ear: Secondary | ICD-10-CM | POA: Diagnosis not present

## 2023-06-20 DIAGNOSIS — R4189 Other symptoms and signs involving cognitive functions and awareness: Secondary | ICD-10-CM | POA: Diagnosis not present

## 2023-06-20 DIAGNOSIS — G629 Polyneuropathy, unspecified: Secondary | ICD-10-CM | POA: Diagnosis not present

## 2023-06-20 DIAGNOSIS — F419 Anxiety disorder, unspecified: Secondary | ICD-10-CM | POA: Diagnosis not present

## 2023-06-20 DIAGNOSIS — I6523 Occlusion and stenosis of bilateral carotid arteries: Secondary | ICD-10-CM | POA: Diagnosis not present

## 2023-06-20 DIAGNOSIS — E785 Hyperlipidemia, unspecified: Secondary | ICD-10-CM | POA: Diagnosis not present

## 2023-06-20 DIAGNOSIS — I739 Peripheral vascular disease, unspecified: Secondary | ICD-10-CM | POA: Diagnosis not present

## 2023-06-20 DIAGNOSIS — R0989 Other specified symptoms and signs involving the circulatory and respiratory systems: Secondary | ICD-10-CM | POA: Diagnosis not present

## 2023-06-25 DIAGNOSIS — I77811 Abdominal aortic ectasia: Secondary | ICD-10-CM | POA: Diagnosis not present

## 2023-06-27 DIAGNOSIS — I70223 Atherosclerosis of native arteries of extremities with rest pain, bilateral legs: Secondary | ICD-10-CM | POA: Diagnosis not present

## 2023-06-27 DIAGNOSIS — D518 Other vitamin B12 deficiency anemias: Secondary | ICD-10-CM | POA: Diagnosis not present

## 2023-06-27 DIAGNOSIS — I10 Essential (primary) hypertension: Secondary | ICD-10-CM | POA: Diagnosis not present

## 2023-06-27 DIAGNOSIS — E559 Vitamin D deficiency, unspecified: Secondary | ICD-10-CM | POA: Diagnosis not present

## 2023-06-27 DIAGNOSIS — F419 Anxiety disorder, unspecified: Secondary | ICD-10-CM | POA: Diagnosis not present

## 2023-06-27 DIAGNOSIS — E038 Other specified hypothyroidism: Secondary | ICD-10-CM | POA: Diagnosis not present

## 2023-06-27 DIAGNOSIS — E119 Type 2 diabetes mellitus without complications: Secondary | ICD-10-CM | POA: Diagnosis not present

## 2023-06-27 DIAGNOSIS — E7849 Other hyperlipidemia: Secondary | ICD-10-CM | POA: Diagnosis not present

## 2023-06-27 DIAGNOSIS — I739 Peripheral vascular disease, unspecified: Secondary | ICD-10-CM | POA: Diagnosis not present

## 2023-06-27 DIAGNOSIS — F0150 Vascular dementia without behavioral disturbance: Secondary | ICD-10-CM | POA: Diagnosis not present

## 2023-07-04 DIAGNOSIS — F03C2 Unspecified dementia, severe, with psychotic disturbance: Secondary | ICD-10-CM | POA: Diagnosis not present

## 2023-07-04 DIAGNOSIS — F419 Anxiety disorder, unspecified: Secondary | ICD-10-CM | POA: Diagnosis not present

## 2023-07-04 DIAGNOSIS — F5101 Primary insomnia: Secondary | ICD-10-CM | POA: Diagnosis not present

## 2023-07-04 DIAGNOSIS — R443 Hallucinations, unspecified: Secondary | ICD-10-CM | POA: Diagnosis not present

## 2023-07-09 DIAGNOSIS — I10 Essential (primary) hypertension: Secondary | ICD-10-CM | POA: Diagnosis not present

## 2023-07-23 DIAGNOSIS — F419 Anxiety disorder, unspecified: Secondary | ICD-10-CM | POA: Diagnosis not present

## 2023-07-23 DIAGNOSIS — I10 Essential (primary) hypertension: Secondary | ICD-10-CM | POA: Diagnosis not present

## 2023-07-23 DIAGNOSIS — E785 Hyperlipidemia, unspecified: Secondary | ICD-10-CM | POA: Diagnosis not present

## 2023-07-23 DIAGNOSIS — H9190 Unspecified hearing loss, unspecified ear: Secondary | ICD-10-CM | POA: Diagnosis not present

## 2023-07-23 DIAGNOSIS — I739 Peripheral vascular disease, unspecified: Secondary | ICD-10-CM | POA: Diagnosis not present

## 2023-07-23 DIAGNOSIS — G629 Polyneuropathy, unspecified: Secondary | ICD-10-CM | POA: Diagnosis not present

## 2023-07-29 DIAGNOSIS — E119 Type 2 diabetes mellitus without complications: Secondary | ICD-10-CM | POA: Diagnosis not present

## 2023-07-29 DIAGNOSIS — F0150 Vascular dementia without behavioral disturbance: Secondary | ICD-10-CM | POA: Diagnosis not present

## 2023-07-29 DIAGNOSIS — E038 Other specified hypothyroidism: Secondary | ICD-10-CM | POA: Diagnosis not present

## 2023-07-29 DIAGNOSIS — I739 Peripheral vascular disease, unspecified: Secondary | ICD-10-CM | POA: Diagnosis not present

## 2023-07-29 DIAGNOSIS — I70223 Atherosclerosis of native arteries of extremities with rest pain, bilateral legs: Secondary | ICD-10-CM | POA: Diagnosis not present

## 2023-07-29 DIAGNOSIS — I10 Essential (primary) hypertension: Secondary | ICD-10-CM | POA: Diagnosis not present

## 2023-07-29 DIAGNOSIS — F419 Anxiety disorder, unspecified: Secondary | ICD-10-CM | POA: Diagnosis not present

## 2023-07-29 DIAGNOSIS — D518 Other vitamin B12 deficiency anemias: Secondary | ICD-10-CM | POA: Diagnosis not present

## 2023-07-29 DIAGNOSIS — E7849 Other hyperlipidemia: Secondary | ICD-10-CM | POA: Diagnosis not present

## 2023-07-29 DIAGNOSIS — E559 Vitamin D deficiency, unspecified: Secondary | ICD-10-CM | POA: Diagnosis not present

## 2023-08-05 DIAGNOSIS — F03C2 Unspecified dementia, severe, with psychotic disturbance: Secondary | ICD-10-CM | POA: Diagnosis not present

## 2023-08-05 DIAGNOSIS — F5101 Primary insomnia: Secondary | ICD-10-CM | POA: Diagnosis not present

## 2023-08-05 DIAGNOSIS — R443 Hallucinations, unspecified: Secondary | ICD-10-CM | POA: Diagnosis not present

## 2023-08-05 DIAGNOSIS — F419 Anxiety disorder, unspecified: Secondary | ICD-10-CM | POA: Diagnosis not present

## 2023-08-09 DIAGNOSIS — I10 Essential (primary) hypertension: Secondary | ICD-10-CM | POA: Diagnosis not present

## 2023-08-20 DIAGNOSIS — I739 Peripheral vascular disease, unspecified: Secondary | ICD-10-CM | POA: Diagnosis not present

## 2023-08-20 DIAGNOSIS — I517 Cardiomegaly: Secondary | ICD-10-CM | POA: Diagnosis not present

## 2023-08-20 DIAGNOSIS — I10 Essential (primary) hypertension: Secondary | ICD-10-CM | POA: Diagnosis not present

## 2023-08-20 DIAGNOSIS — H9190 Unspecified hearing loss, unspecified ear: Secondary | ICD-10-CM | POA: Diagnosis not present

## 2023-08-20 DIAGNOSIS — E785 Hyperlipidemia, unspecified: Secondary | ICD-10-CM | POA: Diagnosis not present

## 2023-08-20 DIAGNOSIS — G629 Polyneuropathy, unspecified: Secondary | ICD-10-CM | POA: Diagnosis not present

## 2023-08-20 DIAGNOSIS — F419 Anxiety disorder, unspecified: Secondary | ICD-10-CM | POA: Diagnosis not present

## 2023-08-20 DIAGNOSIS — F0150 Vascular dementia without behavioral disturbance: Secondary | ICD-10-CM | POA: Diagnosis not present

## 2023-08-23 DIAGNOSIS — E038 Other specified hypothyroidism: Secondary | ICD-10-CM | POA: Diagnosis not present

## 2023-08-23 DIAGNOSIS — I739 Peripheral vascular disease, unspecified: Secondary | ICD-10-CM | POA: Diagnosis not present

## 2023-08-23 DIAGNOSIS — D518 Other vitamin B12 deficiency anemias: Secondary | ICD-10-CM | POA: Diagnosis not present

## 2023-08-23 DIAGNOSIS — E7849 Other hyperlipidemia: Secondary | ICD-10-CM | POA: Diagnosis not present

## 2023-08-23 DIAGNOSIS — F0150 Vascular dementia without behavioral disturbance: Secondary | ICD-10-CM | POA: Diagnosis not present

## 2023-08-23 DIAGNOSIS — E559 Vitamin D deficiency, unspecified: Secondary | ICD-10-CM | POA: Diagnosis not present

## 2023-08-23 DIAGNOSIS — I10 Essential (primary) hypertension: Secondary | ICD-10-CM | POA: Diagnosis not present

## 2023-08-23 DIAGNOSIS — I70223 Atherosclerosis of native arteries of extremities with rest pain, bilateral legs: Secondary | ICD-10-CM | POA: Diagnosis not present

## 2023-08-23 DIAGNOSIS — F419 Anxiety disorder, unspecified: Secondary | ICD-10-CM | POA: Diagnosis not present

## 2023-08-23 DIAGNOSIS — E119 Type 2 diabetes mellitus without complications: Secondary | ICD-10-CM | POA: Diagnosis not present

## 2023-08-23 DIAGNOSIS — E782 Mixed hyperlipidemia: Secondary | ICD-10-CM | POA: Diagnosis not present

## 2023-08-30 DIAGNOSIS — Z79899 Other long term (current) drug therapy: Secondary | ICD-10-CM | POA: Diagnosis not present

## 2023-08-30 DIAGNOSIS — R7612 Nonspecific reaction to cell mediated immunity measurement of gamma interferon antigen response without active tuberculosis: Secondary | ICD-10-CM | POA: Diagnosis not present

## 2023-08-30 DIAGNOSIS — E782 Mixed hyperlipidemia: Secondary | ICD-10-CM | POA: Diagnosis not present

## 2023-08-30 DIAGNOSIS — E119 Type 2 diabetes mellitus without complications: Secondary | ICD-10-CM | POA: Diagnosis not present

## 2023-08-30 DIAGNOSIS — E038 Other specified hypothyroidism: Secondary | ICD-10-CM | POA: Diagnosis not present

## 2023-08-30 DIAGNOSIS — D519 Vitamin B12 deficiency anemia, unspecified: Secondary | ICD-10-CM | POA: Diagnosis not present

## 2023-09-05 DIAGNOSIS — F5101 Primary insomnia: Secondary | ICD-10-CM | POA: Diagnosis not present

## 2023-09-05 DIAGNOSIS — R443 Hallucinations, unspecified: Secondary | ICD-10-CM | POA: Diagnosis not present

## 2023-09-05 DIAGNOSIS — F419 Anxiety disorder, unspecified: Secondary | ICD-10-CM | POA: Diagnosis not present

## 2023-09-05 DIAGNOSIS — F03C2 Unspecified dementia, severe, with psychotic disturbance: Secondary | ICD-10-CM | POA: Diagnosis not present

## 2023-09-08 DIAGNOSIS — R0989 Other specified symptoms and signs involving the circulatory and respiratory systems: Secondary | ICD-10-CM | POA: Diagnosis not present

## 2023-09-08 DIAGNOSIS — R509 Fever, unspecified: Secondary | ICD-10-CM | POA: Diagnosis not present

## 2023-09-08 DIAGNOSIS — F0390 Unspecified dementia without behavioral disturbance: Secondary | ICD-10-CM | POA: Diagnosis not present

## 2023-09-08 DIAGNOSIS — K219 Gastro-esophageal reflux disease without esophagitis: Secondary | ICD-10-CM | POA: Diagnosis not present

## 2023-09-08 DIAGNOSIS — I480 Paroxysmal atrial fibrillation: Secondary | ICD-10-CM | POA: Diagnosis not present

## 2023-09-08 DIAGNOSIS — R0902 Hypoxemia: Secondary | ICD-10-CM | POA: Diagnosis not present

## 2023-09-08 DIAGNOSIS — E785 Hyperlipidemia, unspecified: Secondary | ICD-10-CM | POA: Diagnosis not present

## 2023-09-08 DIAGNOSIS — I700 Atherosclerosis of aorta: Secondary | ICD-10-CM | POA: Diagnosis not present

## 2023-09-08 DIAGNOSIS — A419 Sepsis, unspecified organism: Secondary | ICD-10-CM | POA: Diagnosis not present

## 2023-09-08 DIAGNOSIS — F419 Anxiety disorder, unspecified: Secondary | ICD-10-CM | POA: Diagnosis not present

## 2023-09-08 DIAGNOSIS — N390 Urinary tract infection, site not specified: Secondary | ICD-10-CM | POA: Diagnosis not present

## 2023-09-08 DIAGNOSIS — Z90710 Acquired absence of both cervix and uterus: Secondary | ICD-10-CM | POA: Diagnosis not present

## 2023-09-08 DIAGNOSIS — Z20822 Contact with and (suspected) exposure to covid-19: Secondary | ICD-10-CM | POA: Diagnosis not present

## 2023-09-08 DIAGNOSIS — Z96641 Presence of right artificial hip joint: Secondary | ICD-10-CM | POA: Diagnosis not present

## 2023-09-08 DIAGNOSIS — I10 Essential (primary) hypertension: Secondary | ICD-10-CM | POA: Diagnosis not present

## 2023-09-08 DIAGNOSIS — R062 Wheezing: Secondary | ICD-10-CM | POA: Diagnosis not present

## 2023-09-08 DIAGNOSIS — I517 Cardiomegaly: Secondary | ICD-10-CM | POA: Diagnosis not present

## 2023-09-08 DIAGNOSIS — E876 Hypokalemia: Secondary | ICD-10-CM | POA: Diagnosis not present

## 2023-09-08 DIAGNOSIS — Z87891 Personal history of nicotine dependence: Secondary | ICD-10-CM | POA: Diagnosis not present

## 2023-09-08 DIAGNOSIS — R051 Acute cough: Secondary | ICD-10-CM | POA: Diagnosis not present

## 2023-09-08 DIAGNOSIS — R059 Cough, unspecified: Secondary | ICD-10-CM | POA: Diagnosis not present

## 2023-09-08 DIAGNOSIS — B974 Respiratory syncytial virus as the cause of diseases classified elsewhere: Secondary | ICD-10-CM | POA: Diagnosis not present

## 2023-09-08 DIAGNOSIS — M1990 Unspecified osteoarthritis, unspecified site: Secondary | ICD-10-CM | POA: Diagnosis not present

## 2023-09-08 DIAGNOSIS — Z7901 Long term (current) use of anticoagulants: Secondary | ICD-10-CM | POA: Diagnosis not present

## 2023-09-09 DIAGNOSIS — Z7401 Bed confinement status: Secondary | ICD-10-CM | POA: Diagnosis not present

## 2023-09-09 DIAGNOSIS — N390 Urinary tract infection, site not specified: Secondary | ICD-10-CM | POA: Diagnosis not present

## 2023-09-09 DIAGNOSIS — I10 Essential (primary) hypertension: Secondary | ICD-10-CM | POA: Diagnosis not present

## 2023-09-09 DIAGNOSIS — B338 Other specified viral diseases: Secondary | ICD-10-CM | POA: Diagnosis not present

## 2023-09-09 DIAGNOSIS — Z79899 Other long term (current) drug therapy: Secondary | ICD-10-CM | POA: Diagnosis not present

## 2023-09-09 DIAGNOSIS — K219 Gastro-esophageal reflux disease without esophagitis: Secondary | ICD-10-CM | POA: Diagnosis not present

## 2023-09-09 DIAGNOSIS — I480 Paroxysmal atrial fibrillation: Secondary | ICD-10-CM | POA: Diagnosis not present

## 2023-09-09 DIAGNOSIS — B974 Respiratory syncytial virus as the cause of diseases classified elsewhere: Secondary | ICD-10-CM | POA: Diagnosis not present

## 2023-09-09 DIAGNOSIS — F0390 Unspecified dementia without behavioral disturbance: Secondary | ICD-10-CM | POA: Diagnosis not present

## 2023-09-12 ENCOUNTER — Ambulatory Visit: Payer: Medicare HMO | Admitting: Cardiology

## 2023-10-02 ENCOUNTER — Ambulatory Visit: Payer: 59 | Admitting: Cardiology

## 2023-10-25 ENCOUNTER — Encounter (HOSPITAL_COMMUNITY): Payer: Self-pay

## 2023-10-25 ENCOUNTER — Emergency Department (HOSPITAL_COMMUNITY): Payer: 59

## 2023-10-25 ENCOUNTER — Other Ambulatory Visit: Payer: Self-pay

## 2023-10-25 ENCOUNTER — Emergency Department (HOSPITAL_COMMUNITY)
Admission: EM | Admit: 2023-10-25 | Discharge: 2023-10-25 | Disposition: A | Discharge status: Home / Self Care | Payer: 59 | Attending: Emergency Medicine | Admitting: Emergency Medicine

## 2023-10-25 DIAGNOSIS — R001 Bradycardia, unspecified: Secondary | ICD-10-CM | POA: Diagnosis not present

## 2023-10-25 DIAGNOSIS — S61216A Laceration without foreign body of right little finger without damage to nail, initial encounter: Secondary | ICD-10-CM | POA: Diagnosis not present

## 2023-10-25 DIAGNOSIS — F0390 Unspecified dementia without behavioral disturbance: Secondary | ICD-10-CM | POA: Insufficient documentation

## 2023-10-25 DIAGNOSIS — S0990XA Unspecified injury of head, initial encounter: Secondary | ICD-10-CM | POA: Diagnosis not present

## 2023-10-25 DIAGNOSIS — S79912A Unspecified injury of left hip, initial encounter: Secondary | ICD-10-CM | POA: Diagnosis present

## 2023-10-25 DIAGNOSIS — W19XXXA Unspecified fall, initial encounter: Secondary | ICD-10-CM | POA: Insufficient documentation

## 2023-10-25 DIAGNOSIS — R000 Tachycardia, unspecified: Secondary | ICD-10-CM | POA: Diagnosis not present

## 2023-10-25 DIAGNOSIS — S60414A Abrasion of right ring finger, initial encounter: Secondary | ICD-10-CM | POA: Diagnosis not present

## 2023-10-25 DIAGNOSIS — Z7901 Long term (current) use of anticoagulants: Secondary | ICD-10-CM | POA: Insufficient documentation

## 2023-10-25 DIAGNOSIS — S32512A Fracture of superior rim of left pubis, initial encounter for closed fracture: Secondary | ICD-10-CM | POA: Insufficient documentation

## 2023-10-25 LAB — CBG MONITORING, ED: Glucose-Capillary: 102 mg/dL — ABNORMAL HIGH (ref 70–99)

## 2023-10-25 LAB — CBC WITH DIFFERENTIAL/PLATELET
Abs Immature Granulocytes: 0.02 10*3/uL (ref 0.00–0.07)
Basophils Absolute: 0.1 10*3/uL (ref 0.0–0.1)
Basophils Relative: 1 %
Eosinophils Absolute: 0.3 10*3/uL (ref 0.0–0.5)
Eosinophils Relative: 4 %
HCT: 39.2 % (ref 36.0–46.0)
Hemoglobin: 12.5 g/dL (ref 12.0–15.0)
Immature Granulocytes: 0 %
Lymphocytes Relative: 24 %
Lymphs Abs: 1.5 10*3/uL (ref 0.7–4.0)
MCH: 29.9 pg (ref 26.0–34.0)
MCHC: 31.9 g/dL (ref 30.0–36.0)
MCV: 93.8 fL (ref 80.0–100.0)
Monocytes Absolute: 0.4 10*3/uL (ref 0.1–1.0)
Monocytes Relative: 7 %
Neutro Abs: 3.9 10*3/uL (ref 1.7–7.7)
Neutrophils Relative %: 64 %
Platelets: 201 10*3/uL (ref 150–400)
RBC: 4.18 MIL/uL (ref 3.87–5.11)
RDW: 13.6 % (ref 11.5–15.5)
WBC: 6.1 10*3/uL (ref 4.0–10.5)
nRBC: 0 % (ref 0.0–0.2)

## 2023-10-25 LAB — BASIC METABOLIC PANEL
Anion gap: 8 (ref 5–15)
BUN: 19 mg/dL (ref 8–23)
CO2: 26 mmol/L (ref 22–32)
Calcium: 9.7 mg/dL (ref 8.9–10.3)
Chloride: 105 mmol/L (ref 98–111)
Creatinine, Ser: 0.95 mg/dL (ref 0.44–1.00)
GFR, Estimated: 57 mL/min — ABNORMAL LOW (ref >60)
Glucose, Bld: 108 mg/dL — ABNORMAL HIGH (ref 70–99)
Potassium: 4.3 mmol/L (ref 3.5–5.1)
Sodium: 139 mmol/L (ref 135–145)

## 2023-10-25 LAB — URINALYSIS, ROUTINE W REFLEX MICROSCOPIC
Bilirubin Urine: NEGATIVE
Glucose, UA: NEGATIVE mg/dL
Ketones, ur: NEGATIVE mg/dL
Leukocytes,Ua: NEGATIVE
Nitrite: NEGATIVE
Protein, ur: NEGATIVE mg/dL
Specific Gravity, Urine: 1.013 (ref 1.005–1.030)
pH: 6 (ref 5.0–8.0)

## 2023-10-25 LAB — MAGNESIUM: Magnesium: 2 mg/dL (ref 1.7–2.4)

## 2023-10-25 LAB — TROPONIN I (HIGH SENSITIVITY)
Troponin I (High Sensitivity): 11 ng/L (ref <18)
Troponin I (High Sensitivity): 12 ng/L (ref <18)

## 2023-10-25 MED ORDER — TRAMADOL HCL 50 MG PO TABS: 50.0000 mg | ORAL_TABLET | Freq: Four times a day (QID) | ORAL | 0 refills | Status: DC | PRN

## 2023-10-25 NOTE — Discharge Instructions (Addendum)
You were seen in the emergency department for evaluation of injuries after a fall.  Your x-ray of your pelvis showed a pelvic rami fracture.  This is treated nonoperatively, weightbearing as tolerated and pain control.  When the EMS team evaluated you you had a slow heart rate but you have not had any other episodes of that here.  We are sending you back with a prescription for pain medication as needed.  Follow-up with your primary care doctor and orthopedics.  Return if any worsening or concerning symptoms

## 2023-10-25 NOTE — ED Triage Notes (Signed)
Pt bib [facility] from [facility] of [address]. EMS was originally called out for a fall. Pt is c/o left hip pain. EMS states when they arrived her HR was in the 20s. EMS placed a 20G in left AC, gave 1mg  of atropine, and of NS. EMS states HR was responsive to meds.

## 2023-10-25 NOTE — ED Provider Notes (Signed)
[place] EMERGENCY DEPARTMENT AT [facility] Provider Note   CSN: [id number] Arrival date & time: 10/25/23  1202     History  Chief Complaint  Patient presents with   Fall   Bradycardia         Christie Nelson is a 89 y.o. female.  She is brought in by EMS after an unwitnessed fall.  She lives in a memory care unit.  She is on anticoagulation.  Initially paramedic noted her heart rate in the 20s and she was given atropine with improvement in her heart rate and mental status.  Patient is a very poor historian with dementia, level 5 caveat.  Has injuries to right ring and small finger.  Possibly also having some hip pain left.  The history is provided by the patient and the EMS personnel.  Fall This is a new problem. The problem has not changed since onset.She has tried nothing for the symptoms. The treatment provided no relief.       Home Medications Prior to Admission medications   Medication Sig Start Date End Date Taking? Authorizing Provider  acetaminophen (TYLENOL) 500 MG tablet Take 1 tablet by mouth 3 (three) times daily as needed. 02/06/22   [provider]  apixaban (ELIQUIS) 5 MG TABS tablet Take 1 tablet (5 mg total) by mouth 2 (two) times daily. 03/20/22   Antoine Poche, MD  atorvastatin (LIPITOR) 10 MG tablet Take 1 tablet by mouth at bedtime. 02/06/22   [provider]  cephALEXin (KEFLEX) 500 MG capsule Take 1 capsule (500 mg total) by mouth 3 (three) times daily. 06/19/23   Smoot, Shawn Route, PA-C  Cholecalciferol (VITAMIN D3) 250 MCG (10000 UT) TABS Take 1 tablet by mouth daily.    [provider]  donepezil (ARICEPT) 5 MG tablet Take 5 mg by mouth.    [provider]  guaifenesin (ROBITUSSIN) 100 MG/5ML syrup Take 200 mg by mouth every 8 (eight) hours as needed for cough.    [provider]  lisinopril (ZESTRIL) 10 MG tablet Take 1 tablet (10 mg total) by mouth daily. 10/20/22   Clark, Meghan R, PA-C   PARoxetine (PAXIL) 10 MG tablet Take 10 mg by mouth daily. 01/09/22   [provider]  polyethylene glycol (MIRALAX / GLYCOLAX) 17 g packet Take 17 g by mouth daily as needed for mild constipation. Patient not taking: Reported on 03/04/2023 07/03/22   Rolly Salter, MD  polyethylene glycol (MIRALAX) 17 g packet Take 17 g by mouth daily. Patient not taking: Reported on 03/04/2023 07/03/22   Rolly Salter, MD  potassium chloride (KLOR-CON M) 10 MEQ tablet Take 10 mEq by mouth daily.    [provider]  Probiotic Product (PROBIOTIC ADVANCED PO) Take 250 mg by mouth in the morning and at bedtime.    [provider]      Allergies    Patient has no known allergies.    Review of Systems   Review of Systems  Unable to perform ROS: Dementia    Physical Exam Updated Vital Signs BP (!) 141/84   Pulse 83   Temp (!) 96.5 F (35.8 C) (Rectal)   Resp 14   Ht 5\' 6"  (1.676 m)   Wt 61.8 kg   SpO2 100%   BMI 21.99 kg/m  Physical Exam Vitals and nursing note reviewed.  Constitutional:      General: She is not in acute distress.    Appearance: Normal appearance. She  is well-developed.  HENT:     Head: Normocephalic and atraumatic.  Eyes:     Conjunctiva/sclera: Conjunctivae normal.  Cardiovascular:     Rate and Rhythm: Regular rhythm. Tachycardia present.     Heart sounds: No murmur heard. Pulmonary:     Effort: Pulmonary effort is normal. No respiratory distress.     Breath sounds: Normal breath sounds.  Abdominal:     Palpations: Abdomen is soft.     Tenderness: There is no abdominal tenderness. There is no guarding or rebound.  Musculoskeletal:        General: Tenderness and signs of injury present. No deformity.     Cervical back: Neck supple.     Comments: She has some abrasions over her right ring finger and a laceration over her right small finger.  Full range of motion of bilateral upper extremities and both lower extremities.  No gross deformities.   Right leg is a little shortened and externally rotated though.  Skin:    General: Skin is warm and dry.     Capillary Refill: Capillary refill takes less than 2 seconds.  Neurological:     General: No focal deficit present.     Mental Status: She is alert. She is disoriented.     Motor: No weakness.     ED Results / Procedures / Treatments   Labs (all labs ordered are listed, but only abnormal results are displayed) Labs Reviewed  BASIC METABOLIC PANEL - Abnormal; Notable for the following components:      Result Value   Glucose, Bld 108 (*)    GFR, Estimated 57 (*)    All other components within normal limits  URINALYSIS, ROUTINE W REFLEX MICROSCOPIC - Abnormal; Notable for the following components:   APPearance HAZY (*)    Hgb urine dipstick SMALL (*)    Bacteria, UA RARE (*)    All other components within normal limits  CBG MONITORING, ED - Abnormal; Notable for the following components:   Glucose-Capillary 102 (*)    All other components within normal limits  CBC WITH DIFFERENTIAL/PLATELET  MAGNESIUM  TROPONIN I (HIGH SENSITIVITY)  TROPONIN I (HIGH SENSITIVITY)    EKG EKG Interpretation Date/Time:  Friday October 25 2023 12:10:12 EST Ventricular Rate:  107 PR Interval:  157 QRS Duration:  138 QT Interval:  387 QTC Calculation: 517 R Axis:   -23  Text Interpretation: Sinus tachycardia Right bundle branch block Inferior infarct, recent No significant change since prior 10/24 Confirmed by Meridee Score [phone]) on 10/25/2023 12:16:21 PM  Radiology CT Head Wo Contrast Result Date: 10/25/2023 CLINICAL DATA:  Head trauma, minor (Age >= 65y); Neck trauma (Age >= 65y). Fall. EXAM: CT HEAD WITHOUT CONTRAST CT CERVICAL SPINE WITHOUT CONTRAST TECHNIQUE: Multidetector CT imaging of the head and cervical spine was performed following the standard protocol without intravenous contrast. Multiplanar CT image reconstructions of the cervical spine were also generated. RADIATION  DOSE REDUCTION: This exam was performed according to the departmental dose-optimization program which includes automated exposure control, adjustment of the mA and/or kV according to patient size and/or use of iterative reconstruction technique. COMPARISON:  CT head and cervical spine 06/19/2023 FINDINGS: CT HEAD FINDINGS Brain: There is no evidence of an acute infarct, intracranial hemorrhage, mass, midline shift, or extra-axial fluid collection. There is mild cerebral atrophy. Cerebral white matter hypodensities are unchanged and nonspecific but compatible with mild-to-moderate chronic small vessel ischemic disease. Vascular: Calcified atherosclerosis at the skull base. No hyperdense vessel. Skull: No acute  fracture or suspicious lesion. Sinuses/Orbits: Left maxillary sinus and posterior right ethmoid air cell secretions. Clear mastoid air cells. Bilateral cataract extraction. Other: None. CT CERVICAL SPINE FINDINGS Alignment: Chronic trace retrolisthesis of C3 on C4 and C5 on C6. Chronic grade 1 anterolisthesis of C7 on T1. Skull base and vertebrae: No acute fracture or suspicious osseous lesion. Soft tissues and spinal canal: No prevertebral fluid or swelling. No visible canal hematoma. Disc levels: Similar appearance of multilevel disc degeneration which is severe at C3-4 and C5-6. Moderate multilevel facet arthrosis. Moderate spinal stenosis and moderate to severe bilateral neural foraminal stenosis at C3-4. Moderate to severe bilateral neural foraminal stenosis at C5-6. Upper chest: No mass or consolidation in the included lung apices. Other: Moderate atherosclerotic calcification at the carotid bifurcations. IMPRESSION: 1. No evidence of acute intracranial abnormality or cervical spine fracture. 2. Mild-to-moderate chronic small vessel ischemic disease. Electronically Signed   By: Sebastian Ache M.D.   On: 10/25/2023 15:15   CT Cervical Spine Wo Contrast Result Date: 10/25/2023 CLINICAL DATA:  Head trauma,  minor (Age >= 65y); Neck trauma (Age >= 65y). Fall. EXAM: CT HEAD WITHOUT CONTRAST CT CERVICAL SPINE WITHOUT CONTRAST TECHNIQUE: Multidetector CT imaging of the head and cervical spine was performed following the standard protocol without intravenous contrast. Multiplanar CT image reconstructions of the cervical spine were also generated. RADIATION DOSE REDUCTION: This exam was performed according to the departmental dose-optimization program which includes automated exposure control, adjustment of the mA and/or kV according to patient size and/or use of iterative reconstruction technique. COMPARISON:  CT head and cervical spine 06/19/2023 FINDINGS: CT HEAD FINDINGS Brain: There is no evidence of an acute infarct, intracranial hemorrhage, mass, midline shift, or extra-axial fluid collection. There is mild cerebral atrophy. Cerebral white matter hypodensities are unchanged and nonspecific but compatible with mild-to-moderate chronic small vessel ischemic disease. Vascular: Calcified atherosclerosis at the skull base. No hyperdense vessel. Skull: No acute fracture or suspicious lesion. Sinuses/Orbits: Left maxillary sinus and posterior right ethmoid air cell secretions. Clear mastoid air cells. Bilateral cataract extraction. Other: None. CT CERVICAL SPINE FINDINGS Alignment: Chronic trace retrolisthesis of C3 on C4 and C5 on C6. Chronic grade 1 anterolisthesis of C7 on T1. Skull base and vertebrae: No acute fracture or suspicious osseous lesion. Soft tissues and spinal canal: No prevertebral fluid or swelling. No visible canal hematoma. Disc levels: Similar appearance of multilevel disc degeneration which is severe at C3-4 and C5-6. Moderate multilevel facet arthrosis. Moderate spinal stenosis and moderate to severe bilateral neural foraminal stenosis at C3-4. Moderate to severe bilateral neural foraminal stenosis at C5-6. Upper chest: No mass or consolidation in the included lung apices. Other: Moderate  atherosclerotic calcification at the carotid bifurcations. IMPRESSION: 1. No evidence of acute intracranial abnormality or cervical spine fracture. 2. Mild-to-moderate chronic small vessel ischemic disease. Electronically Signed   By: Sebastian Ache M.D.   On: 10/25/2023 15:15   DG Pelvis Portable Result Date: 10/25/2023 CLINICAL DATA:  Fall. EXAM: PORTABLE PELVIS 1-2 VIEWS COMPARISON:  Pelvic radiograph dated 06/19/2023. FINDINGS: Contour irregularity of the left superior pubic ramus. Bilateral hip arthroplasties in unchanged alignment. The femoral and acetabular hardware appears well seated. Chronic right acetabular fracture is again noted. The sacroiliac joints and pubic symphysis appear anatomically aligned. Degenerative changes of the visualized lower lumbar spine. IMPRESSION: 1. Contour irregularity of the left superior pubic ramus is concerning for a minimally displaced fracture. 2. Grossly intact bilateral hip arthroplasties with unchanged alignment. Electronically Signed   By: Criss Rosales  Lateef M.D.   On: 10/25/2023 14:28   DG Chest Port 1 View Result Date: 10/25/2023 CLINICAL DATA:  Fall. EXAM: PORTABLE CHEST 1 VIEW COMPARISON:  Chest radiograph dated 09/08/2023. FINDINGS: Stable cardiomediastinal silhouette. Aortic atherosclerosis. Similar chronic interstitial changes are again noted in the bilateral upper lung zones. Left basilar atelectasis/scarring. No focal consolidation or sizable pleural effusion. No pneumothorax. Diffuse osseous demineralization. No acute osseous abnormality identified. IMPRESSION: 1. No acute findings in the chest. 2. Chronic interstitial lung disease. Electronically Signed   By: Hart Robinsons M.D.   On: 10/25/2023 14:23    Procedures Procedures    Medications Ordered in ED Medications - No data to display  ED Course/ Medical Decision Making/ A&P Clinical Course as of 10/25/23 1736  Fri Oct 25, 2023  1228 She is a DNR/DNI.  Chest x-ray does not show any obvious  pneumothorax.  Pelvis x-ray shows bilateral hip replacements and no obvious fracture.  Awaiting radiology reading [MB]  81 Son is here.  He said she is at her baseline right now. [MB]    Clinical Course User Index [MB] Terrilee Files, MD                                 Medical Decision Making Amount and/or Complexity of Data Reviewed Labs: ordered. Radiology: ordered.  Risk Prescription drug management.   This patient complains of fall, possible hip pain, bradycardia; this involves an extensive number of treatment Options and is a complaint that carries with it a high risk of complications and morbidity. The differential includes fracture, contusion, dislocation, infection, dehydration, stroke, bleed  I ordered, reviewed and interpreted labs, which included CBC normal chemistries unremarkable urinalysis without signs of infection troponins flat I ordered imaging studies which included CT head and cervical spine, chest x-ray, pelvis x-ray and I independently    visualized and interpreted imaging which showed probable left superior rami fracture Additional history obtained from EMS and patient's son Previous records obtained and reviewed in epic, including recent ED notes and PCP notes Cardiac monitoring reviewed, sinus rhythm Social determinants considered, no significant barriers Critical Interventions: None  After the interventions stated above, I reevaluated the patient and found patient to be awake alert in no distress Admission and further testing considered, no indications for admission.  She is at a facility so we will have her return back to facility weightbearing as tolerated.  Prescription for pain medication and orthopedic follow-up.  Return instructions discussed.  She has had no further episodes of bradycardia here is some not sure what to make of EMS saying her heart rate was in the 20s.         Final Clinical Impression(s) / ED Diagnoses Final diagnoses:   Fall, initial encounter  Closed fracture of superior ramus of left pubis, initial encounter (HCC)  Bradycardia    Rx / DC Orders ED Discharge Orders     None         Terrilee Files, MD 10/25/23 1739

## 2023-11-08 ENCOUNTER — Emergency Department (HOSPITAL_COMMUNITY): Payer: 59

## 2023-11-08 ENCOUNTER — Other Ambulatory Visit: Payer: Self-pay

## 2023-11-08 ENCOUNTER — Emergency Department (HOSPITAL_COMMUNITY)
Admission: EM | Admit: 2023-11-08 | Discharge: 2023-11-09 | Disposition: A | Discharge status: Home / Self Care | Payer: 59 | Attending: Emergency Medicine | Admitting: Emergency Medicine

## 2023-11-08 ENCOUNTER — Encounter (HOSPITAL_COMMUNITY): Payer: Self-pay

## 2023-11-08 DIAGNOSIS — S32592A Other specified fracture of left pubis, initial encounter for closed fracture: Secondary | ICD-10-CM | POA: Insufficient documentation

## 2023-11-08 DIAGNOSIS — Z7901 Long term (current) use of anticoagulants: Secondary | ICD-10-CM | POA: Diagnosis not present

## 2023-11-08 DIAGNOSIS — S51012A Laceration without foreign body of left elbow, initial encounter: Secondary | ICD-10-CM | POA: Insufficient documentation

## 2023-11-08 DIAGNOSIS — R410 Disorientation, unspecified: Secondary | ICD-10-CM | POA: Insufficient documentation

## 2023-11-08 DIAGNOSIS — S51811A Laceration without foreign body of right forearm, initial encounter: Secondary | ICD-10-CM | POA: Diagnosis not present

## 2023-11-08 DIAGNOSIS — W19XXXA Unspecified fall, initial encounter: Secondary | ICD-10-CM | POA: Insufficient documentation

## 2023-11-08 DIAGNOSIS — Z79899 Other long term (current) drug therapy: Secondary | ICD-10-CM | POA: Insufficient documentation

## 2023-11-08 DIAGNOSIS — F0390 Unspecified dementia without behavioral disturbance: Secondary | ICD-10-CM | POA: Insufficient documentation

## 2023-11-08 DIAGNOSIS — S3991XA Unspecified injury of abdomen, initial encounter: Secondary | ICD-10-CM | POA: Diagnosis present

## 2023-11-08 LAB — CBC WITH DIFFERENTIAL/PLATELET
Abs Immature Granulocytes: 0.03 10*3/uL (ref 0.00–0.07)
Basophils Absolute: 0 10*3/uL (ref 0.0–0.1)
Basophils Relative: 1 %
Eosinophils Absolute: 0 10*3/uL (ref 0.0–0.5)
Eosinophils Relative: 1 %
HCT: 38.9 % (ref 36.0–46.0)
Hemoglobin: 12.3 g/dL (ref 12.0–15.0)
Immature Granulocytes: 1 %
Lymphocytes Relative: 11 %
Lymphs Abs: 0.6 10*3/uL — ABNORMAL LOW (ref 0.7–4.0)
MCH: 29.5 pg (ref 26.0–34.0)
MCHC: 31.6 g/dL (ref 30.0–36.0)
MCV: 93.3 fL (ref 80.0–100.0)
Monocytes Absolute: 0.4 10*3/uL (ref 0.1–1.0)
Monocytes Relative: 8 %
Neutro Abs: 4.6 10*3/uL (ref 1.7–7.7)
Neutrophils Relative %: 78 %
Platelets: 285 10*3/uL (ref 150–400)
RBC: 4.17 MIL/uL (ref 3.87–5.11)
RDW: 13.7 % (ref 11.5–15.5)
WBC: 5.8 10*3/uL (ref 4.0–10.5)
nRBC: 0 % (ref 0.0–0.2)

## 2023-11-08 LAB — BASIC METABOLIC PANEL
Anion gap: 12 (ref 5–15)
BUN: 18 mg/dL (ref 8–23)
CO2: 21 mmol/L — ABNORMAL LOW (ref 22–32)
Calcium: 10.4 mg/dL — ABNORMAL HIGH (ref 8.9–10.3)
Chloride: 104 mmol/L (ref 98–111)
Creatinine, Ser: 1.1 mg/dL — ABNORMAL HIGH (ref 0.44–1.00)
GFR, Estimated: 47 mL/min — ABNORMAL LOW (ref >60)
Glucose, Bld: 114 mg/dL — ABNORMAL HIGH (ref 70–99)
Potassium: 4.5 mmol/L (ref 3.5–5.1)
Sodium: 137 mmol/L (ref 135–145)

## 2023-11-08 LAB — CBG MONITORING, ED: Glucose-Capillary: 113 mg/dL — ABNORMAL HIGH (ref 70–99)

## 2023-11-08 MED ORDER — ACETAMINOPHEN 325 MG PO TABS
650.0000 mg | ORAL_TABLET | Freq: Once | ORAL | Status: DC
  Filled 2023-11-08: qty 2

## 2023-11-08 NOTE — ED Provider Notes (Signed)
 [place] EMERGENCY DEPARTMENT AT [facility] Provider Note   CSN: [id number] Arrival date & time: 11/08/23  1657     History  Chief Complaint  Patient presents with   Christie Nelson is a 89 y.o. female.  Patient is a 89 year old female with a past medical history of dementia who presents emergency department from her long-term care facility secondary to a ground-level fall.  Patient is unable provide her own history at the bedside and history was obtained by family and EMS.  It is noted that the patient had a fall in her room.  Of note patient had not been on the ground long when she was discovered.  On presentation she does have a skin tear to her left elbow and right forearm.  She does not appear to be in any distress.  Son notes that she did have a fall approximately 2 weeks ago.  Son also notes that she is currently under hospice care at this time.   Fall       Home Medications Prior to Admission medications   Medication Sig Start Date End Date Taking? Authorizing Provider  acetaminophen (TYLENOL) 500 MG tablet Take 1 tablet by mouth 3 (three) times daily as needed. 02/06/22   [provider]  apixaban (ELIQUIS) 5 MG TABS tablet Take 1 tablet (5 mg total) by mouth 2 (two) times daily. 03/20/22   Antoine Poche, MD  atorvastatin (LIPITOR) 10 MG tablet Take 1 tablet by mouth at bedtime. 02/06/22   [provider]  cephALEXin (KEFLEX) 500 MG capsule Take 1 capsule (500 mg total) by mouth 3 (three) times daily. 06/19/23   Smoot, Shawn Route, PA-C  Cholecalciferol (VITAMIN D3) 250 MCG (10000 UT) TABS Take 1 tablet by mouth daily.    [provider]  donepezil (ARICEPT) 5 MG tablet Take 5 mg by mouth.    [provider]  guaifenesin (ROBITUSSIN) 100 MG/5ML syrup Take 200 mg by mouth every 8 (eight) hours as needed for cough.    [provider]  lisinopril (ZESTRIL) 10 MG tablet Take 1 tablet (10 mg total) by mouth daily.  10/20/22   Clark, Meghan R, PA-C  PARoxetine (PAXIL) 10 MG tablet Take 10 mg by mouth daily. 01/09/22   [provider]  polyethylene glycol (MIRALAX / GLYCOLAX) 17 g packet Take 17 g by mouth daily as needed for mild constipation. Patient not taking: Reported on 03/04/2023 07/03/22   Rolly Salter, MD  polyethylene glycol (MIRALAX) 17 g packet Take 17 g by mouth daily. Patient not taking: Reported on 03/04/2023 07/03/22   Rolly Salter, MD  potassium chloride (KLOR-CON M) 10 MEQ tablet Take 10 mEq by mouth daily.    [provider]  Probiotic Product (PROBIOTIC ADVANCED PO) Take 250 mg by mouth in the morning and at bedtime.    [provider]  traMADol (ULTRAM) 50 MG tablet Take 1 tablet (50 mg total) by mouth every 6 (six) hours as needed. 10/25/23   Terrilee Files, MD      Allergies    Patient has no known allergies.    Review of Systems   Review of Systems  Skin:        Skin tear  All other systems reviewed and are negative.   Physical Exam Updated Vital Signs BP (!) 159/85   Pulse 79   Temp (!) 97.2 F (36.2 C) (Oral)   Resp 16   Wt  61.8 kg   SpO2 92%   BMI 21.99 kg/m  Physical Exam Vitals reviewed.  Constitutional:      Appearance: Normal appearance.  HENT:     Head: Normocephalic and atraumatic.     Nose: Nose normal.     Mouth/Throat:     Mouth: Mucous membranes are moist.  Eyes:     Extraocular Movements: Extraocular movements intact.     Conjunctiva/sclera: Conjunctivae normal.     Pupils: Pupils are equal, round, and reactive to light.  Cardiovascular:     Rate and Rhythm: Normal rate and regular rhythm.     Pulses: Normal pulses.     Heart sounds: Normal heart sounds.  Pulmonary:     Effort: Pulmonary effort is normal. No respiratory distress.     Breath sounds: Normal breath sounds. No stridor. No wheezing or rales.  Abdominal:     General: Abdomen is flat. Bowel sounds are normal. There is no distension.     Palpations:  Abdomen is soft.     Tenderness: There is no abdominal tenderness. There is no guarding.  Musculoskeletal:        General: No swelling, tenderness or deformity. Normal range of motion.     Cervical back: Normal range of motion and neck supple. No rigidity or tenderness.     Right lower leg: No edema.     Left lower leg: No edema.  Skin:    General: Skin is warm and dry.     Comments: Skin tear noted to left elbow and right forearm, no lacerations, no areas of erythema or induration  Neurological:     General: No focal deficit present.     Mental Status: She is alert. Mental status is at baseline. She is disoriented.  Psychiatric:        Mood and Affect: Mood normal.        Behavior: Behavior normal.        Thought Content: Thought content normal.        Judgment: Judgment normal.     ED Results / Procedures / Treatments   Labs (all labs ordered are listed, but only abnormal results are displayed) Labs Reviewed  CBC WITH DIFFERENTIAL/PLATELET - Abnormal; Notable for the following components:      Result Value   Lymphs Abs 0.6 (*)    All other components within normal limits  BASIC METABOLIC PANEL - Abnormal; Notable for the following components:   CO2 21 (*)    Glucose, Bld 114 (*)    Creatinine, Ser 1.10 (*)    Calcium 10.4 (*)    GFR, Estimated 47 (*)    All other components within normal limits  CBG MONITORING, ED - Abnormal; Notable for the following components:   Glucose-Capillary 113 (*)    All other components within normal limits    EKG None  Radiology CT Lumbar Spine Wo Contrast Result Date: 11/08/2023 CLINICAL DATA:  Unwitnessed fall. EXAM: CT LUMBAR SPINE WITHOUT CONTRAST TECHNIQUE: Multidetector CT imaging of the lumbar spine was performed without intravenous contrast administration. Multiplanar CT image reconstructions were also generated. RADIATION DOSE REDUCTION: This exam was performed according to the departmental dose-optimization program which includes  automated exposure control, adjustment of the mA and/or kV according to patient size and/or use of iterative reconstruction technique. COMPARISON:  Lumbar spine radiographs 11/08/2023. Lumbar spine radiographs 01/04/2020. FINDINGS: Segmentation: A partially lumbarized S1 segment is present. Alignment: No significant listhesis is present. Straightening of the normal lumbar lordosis is present.  Rightward curvature is centered at L2-3. Vertebrae: Vertebral body heights are maintained. No acute or healing fractures are present. Paraspinal and other soft tissues: Multiple layering gallstones are present in the gallbladder. No acute inflammatory changes are present. Atherosclerotic changes are present in the aorta and branch vessels without aneurysm. No significant adenopathy is present. Disc levels: Mild facet hypertrophy is present bilaterally at L1-2 and L2-3 without significant stenosis. A leftward disc protrusion at L3-4 contributes to mild left foraminal stenosis. A broad-based disc protrusion is present at L4-5. Moderate facet hypertrophy and ligamentum flavum thickening contribute to severe central canal stenosis. Severe left and moderate right foraminal stenosis is present. L5-S1: A broad-based disc protrusion is present. Moderate facet hypertrophy and ligamentum flavum thickening contribute to moderate central canal stenosis. Moderate right subarticular and severe right foraminal stenosis is present. Mild left foraminal narrowing is present. S1-2: Bilateral facet hypertrophy is present without focal stenosis. IMPRESSION: 1. No acute or healing fractures. 2. Severe central canal stenosis at L4-5 with severe left and moderate right foraminal stenosis. 3. Moderate central canal stenosis at L5-S1 with moderate right subarticular and severe right foraminal stenosis. 4. Mild left foraminal stenosis at L3-4. 5. Cholelithiasis. 6.  Aortic Atherosclerosis (ICD10-I70.0). Electronically Signed   By: Marin Roberts  M.D.   On: 11/08/2023 20:48   DG Lumbar Spine Complete Result Date: 11/08/2023 CLINICAL DATA:  Diffuse pain after a fall. EXAM: LUMBAR SPINE - COMPLETE 4+ VIEW COMPARISON:  01/06/2020 FINDINGS: Five lumbar type vertebral bodies. Diffuse bone demineralization. Normal alignment. Anterior compression of the L1 vertebra. This appears progressed since prior study and could represent an acute fracture. Visualization is somewhat limited due to patient rotation. Degenerative changes with disc space narrowing and endplate osteophyte formation. Vascular calcification in the aorta. Postoperative changes in the hips. Nonspecific calcification in the right upper quadrant, likely multiple gallstones. IMPRESSION: 1. Diffuse bone demineralization and degenerative changes. 2. Anterior compression of L1 appears progressed since prior study, possibly acute. 3. Cholelithiasis. Electronically Signed   By: Burman Nieves M.D.   On: 11/08/2023 19:08   DG Thoracic Spine 2 View Result Date: 11/08/2023 CLINICAL DATA:  Diffuse pain after a fall. EXAM: THORACIC SPINE 2 VIEWS COMPARISON:  None Available. FINDINGS: Examination is limited due to oblique patient positioning and bone demineralization. Diffuse bone demineralization. Normal alignment is suggested. Limited visualization particularly of the upper and midthoracic vertebral bodies but as visualized, no acute vertebral compression deformities are identified. No abnormal paraspinal soft tissue swelling. Degenerative changes with disc space narrowing and endplate osteophyte formation throughout. Calcification of the aorta. IMPRESSION: Technically limited study. Diffuse bone demineralization and degenerative changes. No definite acute bony abnormality. Electronically Signed   By: Burman Nieves M.D.   On: 11/08/2023 19:06   DG Forearm Right Result Date: 11/08/2023 CLINICAL DATA:  Diffuse pain after a fall. EXAM: RIGHT FOREARM - 2 VIEW COMPARISON:  Right wrist 09/27/2022  FINDINGS: Postoperative changes with plate and screw fixations of old fractures of the distal right radius and ulna. No acute fracture or dislocation is identified. No focal bone lesions. Soft tissues are unremarkable. IMPRESSION: Plate and screw fixation of old fractures of the distal right radius and ulna. No acute bony abnormalities. Electronically Signed   By: Burman Nieves M.D.   On: 11/08/2023 19:04   DG Elbow Complete Left Result Date: 11/08/2023 CLINICAL DATA:  Diffuse pain after a fall. Skin tear to the forearm. EXAM: LEFT ELBOW - COMPLETE 3+ VIEW COMPARISON:  None Available. FINDINGS: Moderate degenerative  changes in the left elbow. No evidence of acute fracture or dislocation. No focal bone lesion or bone destruction. No significant effusions. Soft tissue calcifications likely dystrophic. IMPRESSION: Moderate degenerative changes in the left elbow. No acute displaced fractures are identified. Electronically Signed   By: Burman Nieves M.D.   On: 11/08/2023 19:03   DG Hips Bilat W or Wo Pelvis 3-4 Views Result Date: 11/08/2023 CLINICAL DATA:  Fall.  Pain.  Dementia. EXAM: DG HIP (WITH OR WITHOUT PELVIS) 3-4V BILAT COMPARISON:  04/10/2023 FINDINGS: Osteopenia. Bilateral hip arthroplasty. Vascular calcifications. Displaced fractures of the left superior and inferior pubic rami are new. Both hip arthroplasties are located. IMPRESSION: Parasymphyseal left pubic rami fractures, new since 04/10/2023 and likely acute. Electronically Signed   By: Jeronimo Greaves M.D.   On: 11/08/2023 18:56   CT Head Wo Contrast Result Date: 11/08/2023 CLINICAL DATA:  Facial trauma, blunt; Neck trauma (Age >= 65y) EXAM: CT HEAD WITHOUT CONTRAST CT CERVICAL SPINE WITHOUT CONTRAST TECHNIQUE: Multidetector CT imaging of the head and cervical spine was performed following the standard protocol without intravenous contrast. Multiplanar CT image reconstructions of the cervical spine were also generated. RADIATION DOSE  REDUCTION: This exam was performed according to the departmental dose-optimization program which includes automated exposure control, adjustment of the mA and/or kV according to patient size and/or use of iterative reconstruction technique. COMPARISON:  CT head and C-spine 10/25/2023 FINDINGS: CT HEAD FINDINGS Brain: Cerebral ventricle sizes are concordant with the degree of cerebral volume loss. Patchy and confluent areas of decreased attenuation are noted throughout the deep and periventricular white matter of the cerebral hemispheres bilaterally, compatible with chronic microvascular ischemic disease. No evidence of large-territorial acute infarction. No parenchymal hemorrhage. No mass lesion. No extra-axial collection. No mass effect or midline shift. No hydrocephalus. Basilar cisterns are patent. Vascular: No hyperdense vessel. Atherosclerotic calcifications are present within the cavernous internal carotid arteries. Skull: No acute fracture or focal lesion. Sinuses/Orbits: Right ethmoid sinus mucosal thickening. Otherwise paranasal sinuses and mastoid air cells are clear. The orbits are unremarkable. Other: None. CT CERVICAL SPINE FINDINGS Alignment: Mild retrolisthesis of C3 on C4. Grade 1 anterolisthesis of C4 on C5. Mild retrolisthesis of C5 on C6. Skull base and vertebrae: Multilevel moderate severe degenerative changes of the spine with associated at least moderate osseous neuroforaminal stenosis of the bilateral C3-4 and C5-C6 level. No acute fracture. No aggressive appearing focal osseous lesion or focal pathologic process. Soft tissues and spinal canal: No prevertebral fluid or swelling. No visible canal hematoma. Upper chest: Emphysematous changes. Biapical pleural/pulmonary scarring. Other: None. IMPRESSION: 1. No acute intracranial abnormality. 2. No acute displaced fracture or traumatic listhesis of the cervical spine. 3.  Emphysema (ICD10-J43.9). Electronically Signed   By: Tish Frederickson M.D.    On: 11/08/2023 18:45   CT Cervical Spine Wo Contrast Result Date: 11/08/2023 CLINICAL DATA:  Facial trauma, blunt; Neck trauma (Age >= 65y) EXAM: CT HEAD WITHOUT CONTRAST CT CERVICAL SPINE WITHOUT CONTRAST TECHNIQUE: Multidetector CT imaging of the head and cervical spine was performed following the standard protocol without intravenous contrast. Multiplanar CT image reconstructions of the cervical spine were also generated. RADIATION DOSE REDUCTION: This exam was performed according to the departmental dose-optimization program which includes automated exposure control, adjustment of the mA and/or kV according to patient size and/or use of iterative reconstruction technique. COMPARISON:  CT head and C-spine 10/25/2023 FINDINGS: CT HEAD FINDINGS Brain: Cerebral ventricle sizes are concordant with the degree of cerebral volume loss. Patchy and confluent  areas of decreased attenuation are noted throughout the deep and periventricular white matter of the cerebral hemispheres bilaterally, compatible with chronic microvascular ischemic disease. No evidence of large-territorial acute infarction. No parenchymal hemorrhage. No mass lesion. No extra-axial collection. No mass effect or midline shift. No hydrocephalus. Basilar cisterns are patent. Vascular: No hyperdense vessel. Atherosclerotic calcifications are present within the cavernous internal carotid arteries. Skull: No acute fracture or focal lesion. Sinuses/Orbits: Right ethmoid sinus mucosal thickening. Otherwise paranasal sinuses and mastoid air cells are clear. The orbits are unremarkable. Other: None. CT CERVICAL SPINE FINDINGS Alignment: Mild retrolisthesis of C3 on C4. Grade 1 anterolisthesis of C4 on C5. Mild retrolisthesis of C5 on C6. Skull base and vertebrae: Multilevel moderate severe degenerative changes of the spine with associated at least moderate osseous neuroforaminal stenosis of the bilateral C3-4 and C5-C6 level. No acute fracture. No aggressive  appearing focal osseous lesion or focal pathologic process. Soft tissues and spinal canal: No prevertebral fluid or swelling. No visible canal hematoma. Upper chest: Emphysematous changes. Biapical pleural/pulmonary scarring. Other: None. IMPRESSION: 1. No acute intracranial abnormality. 2. No acute displaced fracture or traumatic listhesis of the cervical spine. 3.  Emphysema (ICD10-J43.9). Electronically Signed   By: Tish Frederickson M.D.   On: 11/08/2023 18:45    Procedures Procedures    Medications Ordered in ED Medications  acetaminophen (TYLENOL) tablet 650 mg (650 mg Oral Patient Refused/Not Given 11/08/23 2129)    ED Course/ Medical Decision Making/ A&P Clinical Course as of 11/08/23 2201  Fri Nov 08, 2023  [id number] 89 year old female with history of dementia here after fall.  She is unable to provide any history.  Getting imaging and labs.  Disposition pending results of testing [MB]    Clinical Course User Index [MB] Terrilee Files, MD                                 Medical Decision Making Amount and/or Complexity of Data Reviewed Labs: ordered. Radiology: ordered.  Risk OTC drugs.   This patient presents to the ED for concern of fall differential diagnosis includes acute intracranial hemorrhage, vertebral fracture, long bone fracture, pelvic fracture, skin tear,    Additional history obtained:  Additional history obtained from family External records from outside source obtained and reviewed including medical records   Lab Tests:  I Ordered, and personally interpreted labs.  The pertinent results include: Creatinine at baseline, no electrolyte changes   Imaging Studies ordered:  I ordered imaging studies including CT scan of head, cervical spine, lumbar spine, x-rays of lumbar spine, thoracic spine, left elbow and right forearm, bilateral hips I independently visualized and interpreted imaging which showed pubic rami fracture, no acute vertebral fracture, no  intracranial hemorrhage I agree with the radiologist interpretation   Problem List / ED Course:  Patient is doing well at this time and does remain stable.  Long discussion was had with patient's son regarding findings on x-ray.  Did discuss pubic rami fractures with Dr. Romeo Apple with orthopedics who does agree that the patient can weight-bear as tolerated.  She has had no signs of any acute distress or pain in the emergency department and does tolerate manipulation of the lower extremities without difficulty.  Patient's son is in agreement for discharge back to her long-term care facility at this time as she is currently on hospice care.  Patient had no other acute changes on electrolytes and all other imaging  demonstrated no signs of any acute fracture or hemorrhage.  Close follow-up with primary care doctor was discussed as well as strict return precautions for any new or worsening symptoms.  Son voiced understanding to the plan and had no additional questions.   Social Determinants of Health:  None           Final Clinical Impression(s) / ED Diagnoses Final diagnoses:  Closed fracture of multiple pubic rami, left, initial encounter (HCC)  Skin tear of left elbow without complication, initial encounter    Rx / DC Orders ED Discharge Orders     None         Kathlen Mody 11/08/23 2208    Terrilee Files, MD 11/09/23 1113

## 2023-11-08 NOTE — ED Triage Notes (Signed)
 Pt was found sitting in the middle of a room at her facility with a small skin tear to each forearm, no one witnessed the fall, no other injuries noted.

## 2023-11-08 NOTE — Discharge Instructions (Signed)
 Please follow-up closely with care doctor on an outpatient basis.  You can weight-bear as tolerated on the left lower extremity.  Return to the emergency department immediately for any new or worsening symptoms.

## 2023-11-09 NOTE — ED Notes (Signed)
 Complete bed change done, pericare and new brief placed. Pt cleaned of urine and stool. 2 person assist.

## 2023-11-09 NOTE — ED Notes (Signed)
 Attempted to give report to facility x2, unsuccessful. EMS picked up patient to transport back to facility.

## 2023-12-26 ENCOUNTER — Ambulatory Visit: Payer: 59 | Admitting: Cardiology

## 2024-01-09 DEATH — deceased
